# Patient Record
Sex: Female | Born: 1935 | Race: White | Hispanic: No | Smoking: Former smoker
Health system: Southern US, Community
[De-identification: ages and names within clinical notes are randomized; demographics above are authoritative.]

## PROBLEM LIST (undated history)

## (undated) DIAGNOSIS — E785 Hyperlipidemia, unspecified: Secondary | ICD-10-CM

## (undated) DIAGNOSIS — K219 Gastro-esophageal reflux disease without esophagitis: Secondary | ICD-10-CM

## (undated) DIAGNOSIS — G709 Myoneural disorder, unspecified: Secondary | ICD-10-CM

## (undated) DIAGNOSIS — I10 Essential (primary) hypertension: Secondary | ICD-10-CM

## (undated) HISTORY — PX: TOTAL HIP ARTHROPLASTY: SHX124

## (undated) HISTORY — DX: Gastro-esophageal reflux disease without esophagitis: K21.9

## (undated) HISTORY — DX: Myoneural disorder, unspecified: G70.9

## (undated) HISTORY — DX: Hyperlipidemia, unspecified: E78.5

## (undated) HISTORY — PX: BREAST BIOPSY: SHX20

## (undated) HISTORY — PX: ABDOMINAL AORTIC ANEURYSM REPAIR: SUR1152

## (undated) HISTORY — PX: CHOLECYSTECTOMY: SHX55

## (undated) HISTORY — PX: INGUINAL HERNIA REPAIR: SUR1180

---

## 2001-05-09 ENCOUNTER — Encounter (HOSPITAL_COMMUNITY): Admission: RE | Admit: 2001-05-09 | Discharge: 2001-06-08 | Payer: Self-pay | Admitting: Rheumatology

## 2001-08-26 ENCOUNTER — Encounter: Payer: Self-pay | Admitting: Family Medicine

## 2001-08-26 ENCOUNTER — Ambulatory Visit (HOSPITAL_COMMUNITY): Admission: RE | Admit: 2001-08-26 | Discharge: 2001-08-26 | Payer: Self-pay | Admitting: Family Medicine

## 2002-09-01 ENCOUNTER — Encounter: Payer: Self-pay | Admitting: Emergency Medicine

## 2002-09-02 ENCOUNTER — Inpatient Hospital Stay (HOSPITAL_COMMUNITY): Admission: EM | Admit: 2002-09-02 | Discharge: 2002-09-05 | Payer: Self-pay | Admitting: Emergency Medicine

## 2002-09-03 ENCOUNTER — Encounter: Payer: Self-pay | Admitting: Internal Medicine

## 2002-09-04 ENCOUNTER — Encounter: Payer: Self-pay | Admitting: Internal Medicine

## 2002-10-06 ENCOUNTER — Ambulatory Visit (HOSPITAL_COMMUNITY): Admission: RE | Admit: 2002-10-06 | Discharge: 2002-10-06 | Payer: Self-pay | Admitting: Urology

## 2002-10-06 ENCOUNTER — Encounter: Payer: Self-pay | Admitting: Urology

## 2003-08-18 ENCOUNTER — Ambulatory Visit (HOSPITAL_COMMUNITY): Admission: RE | Admit: 2003-08-18 | Discharge: 2003-08-18 | Payer: Self-pay | Admitting: General Surgery

## 2003-08-18 ENCOUNTER — Encounter: Payer: Self-pay | Admitting: General Surgery

## 2003-08-20 ENCOUNTER — Other Ambulatory Visit: Admission: RE | Admit: 2003-08-20 | Discharge: 2003-08-20 | Payer: Self-pay | Admitting: General Surgery

## 2004-03-11 ENCOUNTER — Emergency Department (HOSPITAL_COMMUNITY): Admission: EM | Admit: 2004-03-11 | Discharge: 2004-03-12 | Payer: Self-pay | Admitting: Emergency Medicine

## 2004-08-18 ENCOUNTER — Ambulatory Visit (HOSPITAL_COMMUNITY): Admission: RE | Admit: 2004-08-18 | Discharge: 2004-08-18 | Payer: Self-pay | Admitting: General Surgery

## 2005-05-19 ENCOUNTER — Ambulatory Visit: Payer: Self-pay | Admitting: Internal Medicine

## 2005-05-19 ENCOUNTER — Ambulatory Visit (HOSPITAL_COMMUNITY): Admission: RE | Admit: 2005-05-19 | Discharge: 2005-05-19 | Payer: Self-pay | Admitting: Internal Medicine

## 2005-08-08 ENCOUNTER — Other Ambulatory Visit: Admission: RE | Admit: 2005-08-08 | Discharge: 2005-08-08 | Payer: Self-pay | Admitting: General Surgery

## 2005-09-04 ENCOUNTER — Ambulatory Visit (HOSPITAL_COMMUNITY): Admission: RE | Admit: 2005-09-04 | Discharge: 2005-09-04 | Payer: Self-pay | Admitting: General Surgery

## 2006-02-21 ENCOUNTER — Ambulatory Visit (HOSPITAL_COMMUNITY): Admission: RE | Admit: 2006-02-21 | Discharge: 2006-02-21 | Payer: Self-pay | Admitting: Internal Medicine

## 2006-09-05 ENCOUNTER — Ambulatory Visit (HOSPITAL_COMMUNITY): Admission: RE | Admit: 2006-09-05 | Discharge: 2006-09-05 | Payer: Self-pay | Admitting: General Surgery

## 2006-09-19 ENCOUNTER — Ambulatory Visit (HOSPITAL_COMMUNITY): Admission: RE | Admit: 2006-09-19 | Discharge: 2006-09-19 | Payer: Self-pay | Admitting: Family Medicine

## 2007-02-10 ENCOUNTER — Emergency Department (HOSPITAL_COMMUNITY): Admission: EM | Admit: 2007-02-10 | Discharge: 2007-02-10 | Payer: Self-pay | Admitting: Family Medicine

## 2007-02-12 ENCOUNTER — Ambulatory Visit: Payer: Self-pay | Admitting: Internal Medicine

## 2007-02-27 ENCOUNTER — Ambulatory Visit (HOSPITAL_COMMUNITY): Admission: RE | Admit: 2007-02-27 | Discharge: 2007-02-27 | Payer: Self-pay | Admitting: Internal Medicine

## 2007-04-09 ENCOUNTER — Ambulatory Visit: Payer: Self-pay | Admitting: Internal Medicine

## 2007-09-20 ENCOUNTER — Ambulatory Visit (HOSPITAL_COMMUNITY): Admission: RE | Admit: 2007-09-20 | Discharge: 2007-09-20 | Payer: Self-pay | Admitting: Family Medicine

## 2007-11-12 ENCOUNTER — Ambulatory Visit (HOSPITAL_COMMUNITY): Admission: RE | Admit: 2007-11-12 | Discharge: 2007-11-12 | Payer: Self-pay | Admitting: Family Medicine

## 2008-09-03 ENCOUNTER — Ambulatory Visit (HOSPITAL_COMMUNITY): Admission: RE | Admit: 2008-09-03 | Discharge: 2008-09-03 | Payer: Self-pay | Admitting: Family Medicine

## 2008-09-29 ENCOUNTER — Ambulatory Visit (HOSPITAL_COMMUNITY): Admission: RE | Admit: 2008-09-29 | Discharge: 2008-09-29 | Payer: Self-pay | Admitting: Family Medicine

## 2008-10-28 ENCOUNTER — Encounter (INDEPENDENT_AMBULATORY_CARE_PROVIDER_SITE_OTHER): Payer: Self-pay | Admitting: General Surgery

## 2008-10-28 ENCOUNTER — Ambulatory Visit (HOSPITAL_COMMUNITY): Admission: RE | Admit: 2008-10-28 | Discharge: 2008-10-28 | Payer: Self-pay | Admitting: General Surgery

## 2009-05-21 ENCOUNTER — Ambulatory Visit (HOSPITAL_COMMUNITY): Admission: RE | Admit: 2009-05-21 | Discharge: 2009-05-21 | Payer: Self-pay | Admitting: Family Medicine

## 2009-11-10 ENCOUNTER — Ambulatory Visit (HOSPITAL_COMMUNITY): Admission: RE | Admit: 2009-11-10 | Discharge: 2009-11-10 | Payer: Self-pay | Admitting: Family Medicine

## 2010-04-28 ENCOUNTER — Ambulatory Visit (HOSPITAL_COMMUNITY): Admission: RE | Admit: 2010-04-28 | Discharge: 2010-04-28 | Payer: Self-pay | Admitting: Internal Medicine

## 2010-05-27 ENCOUNTER — Ambulatory Visit (HOSPITAL_COMMUNITY): Admission: RE | Admit: 2010-05-27 | Discharge: 2010-05-27 | Payer: Self-pay | Admitting: Internal Medicine

## 2010-11-11 ENCOUNTER — Ambulatory Visit (HOSPITAL_COMMUNITY)
Admission: RE | Admit: 2010-11-11 | Discharge: 2010-11-11 | Payer: Self-pay | Source: Home / Self Care | Attending: Internal Medicine | Admitting: Internal Medicine

## 2010-12-18 ENCOUNTER — Encounter: Payer: Self-pay | Admitting: Family Medicine

## 2010-12-18 ENCOUNTER — Encounter: Payer: Self-pay | Admitting: Internal Medicine

## 2011-04-11 NOTE — Assessment & Plan Note (Signed)
[name], [name]                   CHART#:  [id number]   DATE:  04/09/2007                       DOB:  02-May-1927   Followup GERD/erosive reflux esophagitis last seen 03/17/2007 at which  time I performed the EGD for reflux and found distal erosive reflux  esophagitis, noncritical and large hiatal hernia.  We switched her from  Nexium to Prevacid 30 mg daily.  She had some abdominal pain and  diarrhea which may have been related to Nexium.  That has ceased.  Reflux symptoms are now well controlled.  She is not having any  dysphagia.   She does complain of rectal bleeding, blood on her paper every time she  has bowel movement.  This nice lady had a colonoscopy for rectal  bleeding back in 05/19/2005.  She was found to have internal  hemorrhoids.  Rectum looked good.  Left-sided diverticulum.  She has one  bowel movement daily to every other day.  She has not been taking any  fiber supplement whatsoever.  She is not really having any pain, just  feels hemorrhoids and notes small volume bleeding with almost every  stool.   CURRENT MEDICATIONS:  See updated list.   ALLERGIES:  Penicillin, sulfa, Keflex.   FAMILY HISTORY:  There is no family history of colorectal neoplasia.   PHYSICAL EXAMINATION:  GENERAL:  On exam today, she looks well.  VITAL SIGNS:  Weight 183, height 5 feet 9 inches, temp 97.7, BP 140/88,  pulse 68.  SKIN:  Warm and dry.  There is no jaundice.  CHEST:  Lungs are clear to auscultation.  HEART:  Regular rate and rhythm without murmur, gallop, or rub.  ABDOMEN:  Nondistended, positive bowel sounds, soft, nontender, no  organomegaly.  EXTREMITIES:  No edema.  RECTAL EXAM:  She has a friable circumferential hemorrhoidal plexus  externally.  Digital exam elicits no tenderness.  There is good  sphincter tone.  No mass in the rectal vault.  No stone in the rectal  vault.  Mucus is hemoccult positive.   ASSESSMENT:  1. GERD symptoms, well controlled on Prevacid.   She is to continue      Prevacid 30 mg orally daily indefinitely.  Antireflux measures      emphasized.  Weight loss encouraged.  2. Low-volume hematochezia secondary to prominent external      hemorrhoids.  I have encouraged daily fiber supplement, given her      samples of Metamucil, prescription for [facility] apply to      the inner rectum t.i.d., Sitz baths encouraged.  We will do a      baseline      CBC today.  Plan to see this nice lady back in 6 weeks and see how      her rectal bleeding is doing.       Jonathon Bellows, M.D.  Electronically Signed     RMR/MEDQ  D:  04/09/2007  T:  04/09/2007  Job:  [id number]   cc:   Madelin Rear. Sherwood Gambler, MD

## 2011-04-11 NOTE — H&P (Signed)
NAME:  [name], [name]                ACCOUNT NO.:  [id number]   MEDICAL RECORD NO.:  [id number]           PATIENT TYPE:  AMB   LOCATION:  DAY                           FACILITY:  [facility]   PHYSICIAN:  Dalia Heading, M.D.  DATE OF BIRTH:  10-28-1927   DATE OF ADMISSION:  DATE OF DISCHARGE:  LH                              HISTORY & PHYSICAL   CHIEF COMPLAINT:  Chronic cholecystitis.   HISTORY OF PRESENT ILLNESS:  The patient is a 78 year old white female  who was referred for evaluation and treatment of biliary colic secondary  to chronic cholecystitis.  She has been having intermittent right upper  quadrant abdominal pain with radiation to the flank, nausea, and  indigestion for the past few months.  It is made worse with fatty foods.  No fever, chills, or jaundice have been noted.   PAST MEDICAL HISTORY:  Hypertension.   PAST SURGICAL HISTORY:  Hernia repair and breast biopsy.   CURRENT MEDICATIONS:  Diovan, alprazolam, and omeprazole.   ALLERGIES:  No known drug allergies.   REVIEW OF SYSTEMS:  Noncontributory.   PHYSICAL EXAMINATION:  GENERAL:  The patient is a well-developed, well-  nourished white female in no acute distress.  HEENT:  No scleral icterus.  LUNGS:  Clear to auscultation with equal breath sounds bilaterally.  HEART:  Regular rate and rhythm without S3, S4, or murmurs.  ABDOMEN:  Soft and nondistended.  She is tender in the right upper  quadrant to palpation.  No hepatosplenomegaly, masses, or hernias are  identified.   Ultrasound of the gallbladder reveals cholelithiasis versus a  gallbladder polyp.  She does have a stable abdominal aortic aneurysm.   IMPRESSION:  Chronic cholecystitis, cholelithiasis versus polyp.   PLAN:  The patient is scheduled for laparoscopic cholecystectomy on  October 28, 2008.  The risks and benefits of the procedure including  bleeding, infection, hepatobiliary injury, and a possibility of an open  procedure were fully explained  to the patient, who gave informed  consent.      Dalia Heading, M.D.  Electronically Signed     MAJ/MEDQ  D:  10/08/2008  T:  10/09/2008  Job:  [id number]   cc:   Patrica Duel, M.D.  Fax: [phone]   Short Stay at [facility]

## 2011-04-11 NOTE — Op Note (Signed)
[name], [name]                ACCOUNT NO.:  [id number]   MEDICAL RECORD NO.:  [id number]          PATIENT TYPE:  AMB   LOCATION:  DAY                           FACILITY:  [facility]   PHYSICIAN:  Dalia Heading, M.D.  DATE OF BIRTH:  1927/01/18   DATE OF PROCEDURE:  10/28/2008  DATE OF DISCHARGE:                               OPERATIVE REPORT   PREOPERATIVE DIAGNOSIS:  Chronic cholecystitis, question gallbladder  polyp.   POSTOPERATIVE DIAGNOSIS:  Chronic cholecystitis, question gallbladder  polyp.   PROCEDURE:  Laparoscopic cholecystectomy.   SURGEON:  Dalia Heading, MD   ANESTHESIA:  General endotracheal.   INDICATIONS:  The patient is a 77 year old white female who presents  with biliary colic secondary to chronic cholecystitis.  The risks and  benefits of the procedure including bleeding, infection, hepatobiliary  injury, and the possibly an open procedure were fully explained to the  patient, gave informed consent.   PROCEDURE NOTE:  The patient was placed in the supine position.  After  induction of general endotracheal anesthesia, the abdomen was prepped  and draped using the usual sterile technique with Betadine.  Surgical  site confirmation was performed.   A supraumbilical incision was made down to the fascia.  A Veress needle  was introduced into the abdominal cavity and confirmation of placement  was done using the saline drop test.  The abdomen was then insufflated  to 16 mmHg pressure.  An 11-mm trocar was introduced into the abdominal  cavity under direct visualization without difficulty.  The patient was  placed in reverse Trendelenburg position.  Additional 11-mm trocar was  placed in the epigastric region and 5-mm trocar was placed in the right  upper quadrant, right flank regions.  Liver was inspected and noted to  have mild early cirrhotic changes, which also appeared to be age-  appropriate.  The gallbladder was retracted superior and laterally.  The  dissection was begun around the infundibulum of the gallbladder.  The  cystic duct was first identified.  Its juncture to the infundibulum  fully identified.  EndoClip was placed proximally and distally on the  cystic duct and the cystic duct was divided.  This was likewise done in  cystic artery.  The gallbladder was then freed away from the gallbladder  fossa using Bovie electrocautery.  The gallbladder was delivered through  the trocar site using EndoCatch bag.  The gallbladder fossa was  inspected and no abnormal bleeding or bile leakage was noted.  Surgicel  was placed in the gallbladder fossa.  All fluid and air were then  evacuated from the abdominal cavity prior to removal of the trocars.   All wounds were irrigated with normal saline.  All wounds were injected  with 0.5% Sensorcaine.  The supraumbilical fascia was reapproximated  using an 0-Vicryl interrupted suture.  All skin incisions were closed  using staples.  Betadine ointment, dry sterile dressings were applied.   All tape and needle counts were correct at the end of the procedure.  The patient was extubated in the operating room, went back to recovery  room  awake in stable condition.   COMPLICATIONS:  None.   SPECIMEN:  Gallbladder.   ESTIMATED BLOOD LOSS:  Minimal.      Dalia Heading, M.D.  Electronically Signed     MAJ/MEDQ  D:  10/28/2008  T:  10/28/2008  Job:  [id number]   cc:   Patrica Duel, M.D.  Fax: [phone]

## 2011-04-14 NOTE — Op Note (Signed)
NAME:  [name], [name]                ACCOUNT NO.:  [id number]   MEDICAL RECORD NO.:  [id number]          PATIENT TYPE:  AMB   LOCATION:  DAY                           FACILITY:  [facility]   PHYSICIAN:  R. Roetta Sessions, M.D. DATE OF BIRTH:  07-Jan-1927   DATE OF PROCEDURE:  05/19/2005  DATE OF DISCHARGE:                                 OPERATIVE REPORT   PROCEDURE:  Diagnostic colonoscopy.   INDICATIONS FOR PROCEDURE:  The patient is a 77 year old lady with  intermittent low volume hematochezia referred for colonoscopy. She had never  had a colonoscopy. There is no family history of colorectal neoplasia.  Colonoscopy is now being done. This approach has been discussed with the  patient at length. Potential risks, benefits, and alternatives have been  reviewed and questions answered. She is agreeable. Please see documentation  in the medical record.   PROCEDURE NOTE:  O2 saturation, blood pressure, pulse, and respirations were  monitored throughout the entire procedure.  Conscious sedation with IV  Versed and Demerol in incremental doses.   INSTRUMENT:  Olympus video chip system.   FINDINGS:  Digital rectal exam revealed no abnormalities.   ENDOSCOPIC FINDINGS:  Prep was good.   Rectum:  Examination of the rectal mucosa including retroflexed view of the  anal verge revealed only internal hemorrhoids.   Colon:  Colonic mucosa was surveyed from the rectosigmoid junction through  the left, transverse, and right colon to the area of the appendiceal  orifice, ileocecal valve, and cecum. These structures were well seen and  photographed for the record. From this level, the scope was slowly  withdrawn, and all previously mentioned mucosal surfaces were again seen.  The patient had left sided diverticula. The remainder of the colonic mucosa  appeared normal. The colon was somewhat elongated tortuous requiring  external abdominal pressure and changing of the patient's position to reach  the  cecum. The patient tolerated the procedure well and was reactive to  endoscopy.   IMPRESSION:  Internal hemorrhoids. Otherwise normal rectum. Left sided  diverticula. Remainder of colonic mucosa appeared normal.   RECOMMENDATIONS:  1.  Diverticulosis and hemorrhoid literature provided to Ms. Snarski.  2.  Daily Metamucil or Citrucel or fiber supplement.  3.  Anusol HC suppositories 1 per rectum at bedtime for two weeks.  4.  The patient is to let me know if she has any further rectal bleeding.       RMR/MEDQ  D:  05/19/2005  T:  05/19/2005  Job:  [id number]

## 2011-04-14 NOTE — Op Note (Signed)
NAME:  [name], [name]                ACCOUNT NO.:  [id number]   MEDICAL RECORD NO.:  [id number]          PATIENT TYPE:  AMB   LOCATION:  DAY                           FACILITY:  [facility]   PHYSICIAN:  R. Roetta Sessions, M.D. DATE OF BIRTH:  1927/01/28   DATE OF PROCEDURE:  DATE OF DISCHARGE:                               OPERATIVE REPORT   PROCEDURE:  Diagnostic esophagogastroduodenoscopy.   INDICATIONS FOR PROCEDURE:  The patient is a 77 year old Caucasian  female who came to see me recently for exacerbation of chronic diarrhea  which has resolved. She has also had significant long, long history of  gastroesophageal reflux disease and never had her upper GI tract  evaluated.  We stopped Nexium because of some abdominal pain and the  concern that diarrhea could have been exacerbated by Nexium.  She was  started on Prevacid 30 mg orally daily through my office on 02/12/2007.  This has been associated with marked improvement in her abdominal pain  and diarrhea as well as her reflux symptoms.  EGD is now being done.  This approach has been discussed with the patient at length. Potential  risks, benefits and alternatives have been reviewed, questions answered.  She is agreeable. Please see documentation in medical records procedure  note, O2 Saturation, blood pressure and pulses.  The patient was  monitored throughout the entire procedure.   SEDATION:  Conscious sedation with Versed 3 mg IV, Demerol 75 mg IV,  divided plus Cetacaine spray for topical pharyngeal anesthesia.   INSTRUMENT:  Pentax video chip system.   FINDINGS:  Examination achieved via esophagus revealed some erosion of  the EG junction that was noncritical ring without Barrett's esophagus or  neoplasia.  EG junction was easily traversed into the stomach. Gastric  cavity was empty, insufflated well with air throughout the examination.  Gastric mucosa retroflex view revealed proximal stomach, esophagogastric  junction  demonstrated a moderately large hiatal hernia.  Otherwise, the  gastric mucosa appeared normal.  The pylorus was patent was patent and  easily traversed.  Examination of the bulb to the 2nd portion revealed  no abnormalities.   Therapeutic/diagnostic maneuvers performed:  None.   The patient tolerated the procedure as well as direct endoscopy.   IMPRESSION:  1. Distal esophageal erosions consistent with mild erosive reflux      esophagitis and noncritical Schatzki's ring not manipulated,      otherwise normal esophagus.  2. Moderately large hiatal hernia.  Otherwise normal stomach, D1, D2.   RECOMMENDATION:  Anti-reflux list was provided to Ms. Bruntz.  Continue  Prevacid 30 mg orally daily.  Followup appointment with [place] in 6 weeks.      Jonathon Bellows, M.D.  Electronically Signed     RMR/MEDQ  D:  02/27/2007  T:  02/27/2007  Job:  [id number]

## 2011-04-14 NOTE — H&P (Signed)
[name], [name]                  ACCOUNT NO.:  [id number]   MEDICAL RECORD NO.:  [id number]          PATIENT TYPE:  OUT   LOCATION:  RAD                           FACILITY:  [facility]   PHYSICIAN:  R. Roetta Sessions, M.D. DATE OF BIRTH:  08-Jun-1927   DATE OF ADMISSION:  09/19/2006  DATE OF DISCHARGE:  10/24/2007LH                              HISTORY & PHYSICAL   Audio too short to transcribe (less than 5 seconds)      R. Roetta Sessions, M.D.     RMR/MEDQ  D:  02/12/2007  T:  02/12/2007  Job:  [id number]
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
## 2011-04-14 NOTE — Consult Note (Signed)
[name], [name]                  ACCOUNT NO.:  [id number]   MEDICAL RECORD NO.:  [id number]          PATIENT TYPE:  AMB   LOCATION:                                FACILITY:  [facility]   PHYSICIAN:  R. Roetta Sessions, M.D. DATE OF BIRTH:  [birth year]   DATE OF CONSULTATION:  02/12/2007  DATE OF DISCHARGE:                                 CONSULTATION   CHIEF COMPLAINT:  Diarrhea, history of reflux.   [name] is a pleasant 77 year old Caucasian female seen through  the courtesy of Dr. Patrica Duel to further evaluate a couple month  history of diarrhea.  She tells me she has had diarrhea off and on for a  few years.  She states diarrhea subsided two weeks ago and was wondering  why she needed to keep her appointment, she is having one formed bowel  movement daily currently.  She also has headache and vague epigastric  pain from time to time.  She was started on hyoscyamine a couple of  weeks ago with excellent control in her diarrhea symptoms.  She has been  on Nexium 40 mg orally daily for a few years.  She continues to have  significant regurgitation and reflux symptoms.  She has never had her  upper GI tract evaluated.  She tells me she had a CT scan at [facility] for the epigastric pain and nothing was found previously.  Her  gallbladder remains in situ.  Abdominal pain does not have a post  prandial component.  She is moving her bowels once daily.  She had a  history of rectal bleeding for which she had a colonoscopy in June 2006,  and found hemorrhoids and some left-sided diverticula.  She has not lost  any weight.  Her appetite is well maintained.  She denies odynophagia or  dysphagia.  She was originally started on doxycycline for left facial  parotitis.  She does not use alcohol.  She stopped smoking 15 years ago.   PAST MEDICAL HISTORY:  1. Hypertension.  2. Gastroesophageal reflux disease.   PAST SURGICAL HISTORY:  1. Inguinal hernia repair.  2. Breast  biopsy for benign cysts.   CURRENT MEDICATIONS:  1. Hyoscyamine b.i.d.  2. Nexium 40 mg orally daily.  3. Benicar/hydrochlorothiazide 20/12.5 daily.  4. Doxycycline b.i.d.   ALLERGIES:  1. PENICILLIN.  2. SULFA.  3. KEFLEX.   FAMILY HISTORY:  Mother died with a broken neck at age 11.  Father died  with MI in his 4s.  No history of chronic GI or liver illness.   SOCIAL HISTORY:  Patient is widowed.  She has four children.  Retired  from [employer].  Stopped smoking 15 years ago.  No alcohol.   REVIEW OF SYSTEMS:  No recent chest pain or dyspnea on exertion.  No  fever or chills.  No change in weight.  Otherwise as in history of  present illness.   PHYSICAL EXAMINATION:  GENERAL APPEARANCE:  A pleasant 77 year old lady  resting comfortably accompanied by her daughter.  VITAL SIGNS:  Weight 188, height  5 feet 9, temperature 98, blood  pressure 122/80, pulse 80.  SKIN:  Warm and dry, no jaundice.  HEENT:  No scleral icterus.  Conjunctivae are pink.  CHEST:  Lungs are clear to auscultation.  CARDIOVASCULAR:  Regular rate and rhythm without murmurs, rubs, or  gallops.  ABDOMEN:  Nondistended with positive bowel sounds.  She does have, just  to the left of the xiphoid process, localized tenderness just below the  costal margin.  No appreciable mass or organomegaly.  EXTREMITIES:  No edema.  RECTAL:  External hemorrhoids.  Digital mass revealed no mass, brown  stool, Hemoccult negative.   IMPRESSION:  [name] is a pleasant 77 year old lady with recent  exacerbation of chronic diarrhea.  Symptoms are better on hyoscyamine.  I note she does take Nexium and has headache and some vague epigastric  pain along the way.  These are all potential side effects of Nexium.  No  doubt she has a long, long history of significant gastroesophageal  reflux disease symptoms and has not had her upper gastrointestinal tract  evaluated by her history.  She has had an unremarkable CT scan for  her  epigastric pain previously.  As far as diarrhea is concerned, she could  have underlying irritable bowel syndrome. I think we need to go ahead  and change her out from Nexium to another agent.  She tells me she has  tried AcipHex before but it did not work.   RECOMMENDATIONS:  1. Stop Nexium, begin Prevacid 30 mg capsules one daily and samples      provided for three weeks.  2. Proceed with an EGD.  Potential risks, benefits, and alternatives      have been reviewed.  Questions answered.  She is agreeable.  Make      further recommendations in the very near future.  She was      admonished to take a full glass of water with all of her      medications including doxycycline and stay upright for 30 minutes      after ingesting her medications to decrease the chances of pill-      induced esophagitis.   I would like to thank Dr. Patrica Duel for allowing me to see this nice  lady once again.      Jonathon Bellows, M.D.  Electronically Signed     RMR/MEDQ  D:  02/12/2007  T:  02/12/2007  Job:  [id number]   cc:   Patrica Duel, M.D.  Fax: [phone]
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
## 2011-04-21 ENCOUNTER — Other Ambulatory Visit (HOSPITAL_COMMUNITY): Payer: Self-pay | Admitting: Internal Medicine

## 2011-04-21 DIAGNOSIS — I10 Essential (primary) hypertension: Secondary | ICD-10-CM

## 2011-04-21 DIAGNOSIS — Z139 Encounter for screening, unspecified: Secondary | ICD-10-CM

## 2011-04-27 ENCOUNTER — Encounter (HOSPITAL_COMMUNITY): Payer: Self-pay

## 2011-04-27 ENCOUNTER — Ambulatory Visit (HOSPITAL_COMMUNITY)
Admission: RE | Admit: 2011-04-27 | Discharge: 2011-04-27 | Disposition: A | Discharge status: Home / Self Care | Payer: Medicare Other | Source: Ambulatory Visit | Attending: Internal Medicine | Admitting: Internal Medicine

## 2011-04-27 DIAGNOSIS — Z139 Encounter for screening, unspecified: Secondary | ICD-10-CM

## 2011-04-27 DIAGNOSIS — I714 Abdominal aortic aneurysm, without rupture, unspecified: Secondary | ICD-10-CM | POA: Insufficient documentation

## 2011-04-27 DIAGNOSIS — I10 Essential (primary) hypertension: Secondary | ICD-10-CM | POA: Insufficient documentation

## 2011-04-27 HISTORY — DX: Essential (primary) hypertension: I10

## 2011-05-29 ENCOUNTER — Encounter: Payer: Medicare Other | Admitting: Surgery

## 2011-06-12 ENCOUNTER — Encounter (INDEPENDENT_AMBULATORY_CARE_PROVIDER_SITE_OTHER): Payer: Medicare Other | Admitting: Surgery

## 2011-06-12 DIAGNOSIS — I714 Abdominal aortic aneurysm, without rupture, unspecified: Secondary | ICD-10-CM
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
## 2011-07-27 ENCOUNTER — Encounter: Payer: Self-pay | Admitting: Vascular Surgery

## 2011-08-15 ENCOUNTER — Encounter: Payer: Medicare Other | Admitting: Vascular Surgery

## 2011-08-29 LAB — BASIC METABOLIC PANEL
CO2: 26
Calcium: 9.4
Chloride: 107
Glucose, Bld: 102 — ABNORMAL HIGH
Potassium: 3.8
Sodium: 139

## 2011-08-29 LAB — CBC
HCT: 41.5
Hemoglobin: 14.3
MCHC: 34.4
MCV: 91.3
RBC: 4.55
RDW: 13.1

## 2011-08-29 LAB — HEPATIC FUNCTION PANEL
Alkaline Phosphatase: 75
Bilirubin, Direct: 0.2
Total Bilirubin: 1

## 2011-09-19 ENCOUNTER — Encounter: Payer: Medicare Other | Admitting: Vascular Surgery

## 2011-10-20 ENCOUNTER — Other Ambulatory Visit (HOSPITAL_COMMUNITY): Payer: Self-pay | Admitting: General Surgery

## 2011-10-20 DIAGNOSIS — Z139 Encounter for screening, unspecified: Secondary | ICD-10-CM

## 2011-11-13 ENCOUNTER — Ambulatory Visit (HOSPITAL_COMMUNITY)
Admission: RE | Admit: 2011-11-13 | Discharge: 2011-11-13 | Disposition: A | Discharge status: Home / Self Care | Payer: Medicare Other | Source: Ambulatory Visit | Attending: General Surgery | Admitting: General Surgery

## 2011-11-13 DIAGNOSIS — Z1231 Encounter for screening mammogram for malignant neoplasm of breast: Secondary | ICD-10-CM | POA: Insufficient documentation

## 2011-11-13 DIAGNOSIS — Z139 Encounter for screening, unspecified: Secondary | ICD-10-CM

## 2012-02-07 ENCOUNTER — Other Ambulatory Visit: Payer: Self-pay | Admitting: Dermatology

## 2012-06-10 ENCOUNTER — Ambulatory Visit: Payer: Medicare Other | Admitting: Surgery

## 2012-06-14 ENCOUNTER — Encounter: Payer: Self-pay | Admitting: Neurosurgery

## 2012-06-17 ENCOUNTER — Encounter: Payer: Self-pay | Admitting: Neurosurgery

## 2012-06-17 ENCOUNTER — Ambulatory Visit (INDEPENDENT_AMBULATORY_CARE_PROVIDER_SITE_OTHER): Payer: Medicare Other | Admitting: *Deleted

## 2012-06-17 ENCOUNTER — Ambulatory Visit (INDEPENDENT_AMBULATORY_CARE_PROVIDER_SITE_OTHER): Payer: Medicare Other | Admitting: Neurosurgery

## 2012-06-17 VITALS — BP 162/88 | HR 65 | Resp 16 | Ht 69.0 in | Wt 180.5 lb

## 2012-06-17 DIAGNOSIS — I714 Abdominal aortic aneurysm, without rupture: Secondary | ICD-10-CM

## 2012-06-17 DIAGNOSIS — Z48812 Encounter for surgical aftercare following surgery on the circulatory system: Secondary | ICD-10-CM | POA: Insufficient documentation

## 2012-06-17 NOTE — Progress Notes (Signed)
[facility] Office Note  CC: Annual AAA duplex for surveillance Referring Physician: Hart Rochester  History of Present Illness: 78 year old female patient of Dr. Hart Rochester who is followed for known AAA. The patient denies any abdominal or back pain. Patient also denies any new medical diagnoses or recent surgeries.  Past Medical History  Diagnosis Date  . Hypertension   . GERD (gastroesophageal reflux disease)   . Neuromuscular disorder   . Hyperlipidemia     ROS: [x]  Positive   [ ]  Denies    General: [ ]  Weight loss, [ ]  Fever, [ ]  chills Neurologic: [ ]  Dizziness, [ ]  Blackouts, [ ]  Seizure [ ]  Stroke, [ ]  "Mini stroke", [ ]  Slurred speech, [ ]  Temporary blindness; [ ]  weakness in arms or legs, [ ]  Hoarseness Cardiac: [ ]  Chest pain/pressure, [ ]  Shortness of breath at rest [ ]  Shortness of breath with exertion, [ ]  Atrial fibrillation or irregular heartbeat Vascular: [ ]  Pain in legs with walking, [ ]  Pain in legs at rest, [ ]  Pain in legs at night,  [ ]  Non-healing ulcer, [ ]  Blood clot in vein/DVT,   Pulmonary: [ ]  Home oxygen, [ ]  Productive cough, [ ]  Coughing up blood, [ ]  Asthma,  [ ]  Wheezing Musculoskeletal:  [ ]  Arthritis, [ ]  Low back pain, [ ]  Joint pain Hematologic: [ ]  Easy Bruising, [ ]  Anemia; [ ]  Hepatitis Gastrointestinal: [ ]  Blood in stool, [ ]  Gastroesophageal Reflux/heartburn, [ ]  Trouble swallowing Urinary: [ ]  chronic Kidney disease, [ ]  on HD - [ ]  MWF or [ ]  TTHS, [ ]  Burning with urination, [ ]  Difficulty urinating Skin: [ ]  Rashes, [ ]  Wounds Psychological: [ ]  Anxiety, [ ]  Depression   Social History History  Substance Use Topics  . Smoking status: Former Smoker -- 0.5 packs/day    Types: Cigarettes    Quit date: 11/28/1991  . Smokeless tobacco: Not on file  . Alcohol Use: No    Family History Family History  Problem Relation Age of Onset  . Heart disease Father     Allergies  Allergen Reactions  .  Cephalexin   . Sulfa Antibiotics     Current Outpatient Prescriptions  Medication Sig Dispense Refill  . ALPRAZolam (XANAX) 0.5 MG tablet Take 0.25 mg by mouth at bedtime as needed. 0.5 mg take1/2 tab at hs       . gabapentin (NEURONTIN) 100 MG capsule Take 100 mg by mouth 3 (three) times daily.        Marland Kitchen omeprazole (PRILOSEC) 20 MG capsule Take 20 mg by mouth daily.        . valsartan-hydrochlorothiazide (DIOVAN-HCT) 160-12.5 MG per tablet Take 1 tablet by mouth daily.        . Vitamin D, Ergocalciferol, (DRISDOL) 50000 UNITS CAPS Take 50,000 Units by mouth.        . calcium carbonate (TUMS - DOSED IN MG ELEMENTAL CALCIUM) 500 MG chewable tablet Chew 1 tablet by mouth daily.        . fish oil-omega-3 fatty acids 1000 MG capsule Take 2 g by mouth daily.          Physical Examination  Filed Vitals:   06/17/12 1001  BP: 162/88  Pulse: 65  Resp: 16    Body mass index is 26.66 kg/(m^2).  General:  WDWN in NAD Gait: Normal HEENT: WNL Eyes: Pupils equal Pulmonary: normal non-labored breathing , without Rales, rhonchi,  wheezing  Cardiac: RRR, without  Murmurs, rubs or gallops; No carotid bruits Abdomen: soft, NT, no masses Skin: no rashes, ulcers noted Vascular Exam/Pulses: 2+ radial pulses bilaterally, palpable femoral pulses bilaterally, no abdominal mass is palpated  Extremities without ischemic changes, no Gangrene , no cellulitis; no open wounds;  Musculoskeletal: no muscle wasting or atrophy  Neurologic: A&O X 3; Appropriate Affect ; SENSATION: normal; MOTOR FUNCTION:  moving all extremities equally. Speech is fluent/normal  Non-Invasive Vascular Imaging: Maximum AAA sac size today is 3.09 distally  ASSESSMENT/PLAN: Asymptomatic patient with a small known AAA. This has not increased from previous exam which was 3.2. The patient will followup in one year with repeat AAA duplex. Her questions were encouraged and answered, she is in agreement with this plan. The patient knows  the signs and symptoms of rupture and knows to report to the nearest emergency department should this occur.  Lauree Chandler ANP  Clinic M.D.: Hart Rochester

## 2012-06-24 NOTE — Procedures (Unsigned)
DUPLEX ULTRASOUND OF ABDOMINAL AORTA  INDICATION:  AAA follow-up.  HISTORY: Diabetes:  No. Cardiac:  No. Hypertension:  Yes. Smoking:  Yes. Connective Tissue Disorder: Family History:  No. Previous Surgery:  No.  DUPLEX EXAM:         AP (cm)                   TRANSVERSE (cm) Proximal             2.97 cm Mid                  2.82 cm Distal               3.09 cm Right Iliac          Not visualized Left Iliac           Not visualized  PREVIOUS:  Date:  Unknown date and location  AP:  3.2 cm  TRANSVERSE:  IMPRESSION: 1. Abdominal aortic aneurysm measuring 3.09 cm anterior posterior at     its largest diameter. 2. Limited visualization due to overlying bowel gas.  ___________________________________________ Quita Skye. Hart Rochester, M.D.  EM/MEDQ  D:  06/17/2012  T:  06/17/2012  Job:  [id number]

## 2012-10-15 ENCOUNTER — Other Ambulatory Visit (HOSPITAL_COMMUNITY): Payer: Self-pay | Admitting: General Surgery

## 2012-10-15 DIAGNOSIS — Z139 Encounter for screening, unspecified: Secondary | ICD-10-CM

## 2012-11-14 ENCOUNTER — Ambulatory Visit (HOSPITAL_COMMUNITY): Payer: Medicare Other

## 2012-11-26 ENCOUNTER — Ambulatory Visit (HOSPITAL_COMMUNITY)
Admission: RE | Admit: 2012-11-26 | Discharge: 2012-11-26 | Disposition: A | Discharge status: Home / Self Care | Payer: Medicare Other | Source: Ambulatory Visit | Attending: General Surgery | Admitting: General Surgery

## 2012-11-26 DIAGNOSIS — Z1231 Encounter for screening mammogram for malignant neoplasm of breast: Secondary | ICD-10-CM | POA: Insufficient documentation

## 2012-11-26 DIAGNOSIS — Z139 Encounter for screening, unspecified: Secondary | ICD-10-CM

## 2013-06-17 ENCOUNTER — Ambulatory Visit: Payer: Medicare Other | Admitting: Neurosurgery

## 2013-06-17 ENCOUNTER — Encounter (INDEPENDENT_AMBULATORY_CARE_PROVIDER_SITE_OTHER): Payer: Medicare Other | Admitting: *Deleted

## 2013-06-17 DIAGNOSIS — I714 Abdominal aortic aneurysm, without rupture: Secondary | ICD-10-CM

## 2013-06-18 ENCOUNTER — Other Ambulatory Visit: Payer: Self-pay | Admitting: *Deleted

## 2013-06-18 DIAGNOSIS — I714 Abdominal aortic aneurysm, without rupture: Secondary | ICD-10-CM

## 2013-06-26 ENCOUNTER — Encounter: Payer: Self-pay | Admitting: Vascular Surgery

## 2014-05-06 ENCOUNTER — Other Ambulatory Visit: Payer: Self-pay | Admitting: Dermatology

## 2014-10-21 ENCOUNTER — Other Ambulatory Visit: Payer: Self-pay | Admitting: Dermatology

## 2015-04-02 ENCOUNTER — Emergency Department (HOSPITAL_COMMUNITY): Payer: Medicare Other

## 2015-04-02 ENCOUNTER — Encounter (HOSPITAL_COMMUNITY): Payer: Self-pay

## 2015-04-02 ENCOUNTER — Emergency Department (HOSPITAL_COMMUNITY)
Admission: EM | Admit: 2015-04-02 | Discharge: 2015-04-02 | Disposition: A | Discharge status: Home / Self Care | Payer: Medicare Other | Attending: Emergency Medicine | Admitting: Emergency Medicine

## 2015-04-02 DIAGNOSIS — Y9389 Activity, other specified: Secondary | ICD-10-CM | POA: Insufficient documentation

## 2015-04-02 DIAGNOSIS — Y92007 Garden or yard of unspecified non-institutional (private) residence as the place of occurrence of the external cause: Secondary | ICD-10-CM | POA: Diagnosis not present

## 2015-04-02 DIAGNOSIS — Z8719 Personal history of other diseases of the digestive system: Secondary | ICD-10-CM | POA: Diagnosis not present

## 2015-04-02 DIAGNOSIS — Z87891 Personal history of nicotine dependence: Secondary | ICD-10-CM | POA: Diagnosis not present

## 2015-04-02 DIAGNOSIS — E785 Hyperlipidemia, unspecified: Secondary | ICD-10-CM | POA: Insufficient documentation

## 2015-04-02 DIAGNOSIS — R0789 Other chest pain: Secondary | ICD-10-CM

## 2015-04-02 DIAGNOSIS — S7001XA Contusion of right hip, initial encounter: Secondary | ICD-10-CM | POA: Insufficient documentation

## 2015-04-02 DIAGNOSIS — Y998 Other external cause status: Secondary | ICD-10-CM | POA: Diagnosis not present

## 2015-04-02 DIAGNOSIS — W010XXA Fall on same level from slipping, tripping and stumbling without subsequent striking against object, initial encounter: Secondary | ICD-10-CM | POA: Insufficient documentation

## 2015-04-02 DIAGNOSIS — W19XXXA Unspecified fall, initial encounter: Secondary | ICD-10-CM

## 2015-04-02 DIAGNOSIS — Z79899 Other long term (current) drug therapy: Secondary | ICD-10-CM | POA: Diagnosis not present

## 2015-04-02 DIAGNOSIS — I10 Essential (primary) hypertension: Secondary | ICD-10-CM | POA: Diagnosis not present

## 2015-04-02 DIAGNOSIS — S299XXA Unspecified injury of thorax, initial encounter: Secondary | ICD-10-CM | POA: Diagnosis not present

## 2015-04-02 DIAGNOSIS — S79911A Unspecified injury of right hip, initial encounter: Secondary | ICD-10-CM | POA: Diagnosis present

## 2015-04-02 MED ORDER — TRAMADOL HCL 50 MG PO TABS: 50.0000 mg | ORAL_TABLET | Freq: Four times a day (QID) | ORAL | Status: DC | PRN

## 2015-04-02 NOTE — ED Provider Notes (Signed)
CSN: [id number]     Arrival date & time 04/02/15  1622 History   First MD Initiated Contact with Patient 04/02/15 1634     Chief Complaint  Patient presents with  . Fall     (Consider location/radiation/quality/duration/timing/severity/associated sxs/prior Treatment) Patient is a 81 y.o. female presenting with fall. The history is provided by the patient and a relative.  Fall Associated symptoms include chest pain. Pertinent negatives include no abdominal pain, no headaches and no shortness of breath.   patient with a fall while outside and slipped and fell on a rock that occurred on Wednesday. 2 days ago. Patient with complaint of pain to the right hip and the right lower lateral chest area. No loss of consciousness no headache no neck pain no back pain no other extremity pain. No abdominal pain. No nausea vomiting. Patient is not on any blood thinners.  Past Medical History  Diagnosis Date  . Hypertension   . GERD (gastroesophageal reflux disease)   . Neuromuscular disorder   . Hyperlipidemia    Past Surgical History  Procedure Laterality Date  . Abdominal aortic aneurysm repair    . Breast biopsy    . Inguinal hernia repair    . Cholecystectomy     Family History  Problem Relation Age of Onset  . Heart disease Father    History  Substance Use Topics  . Smoking status: Former Smoker -- 0.50 packs/day    Types: Cigarettes    Quit date: 11/28/1991  . Smokeless tobacco: Not on file  . Alcohol Use: No   OB History    No data available     Review of Systems  Constitutional: Negative for fever.  HENT: Negative for congestion.   Eyes: Negative for visual disturbance.  Respiratory: Negative for shortness of breath.   Cardiovascular: Positive for chest pain.  Gastrointestinal: Negative for nausea, vomiting and abdominal pain.  Genitourinary: Negative for dysuria.  Musculoskeletal: Negative for back pain and neck pain.  Skin: Negative for rash.  Neurological: Negative  for syncope and headaches.  Hematological: Bruises/bleeds easily.  Psychiatric/Behavioral: Negative for confusion.      Allergies  Cephalexin and Sulfa antibiotics  Home Medications   Prior to Admission medications   Medication Sig Start Date End Date Taking? Authorizing Provider  ALPRAZolam Prudy Feeler) 0.5 MG tablet Take 0.25 mg by mouth at bedtime as needed.    Yes Historical Provider, MD  gabapentin (NEURONTIN) 100 MG capsule Take 100 mg by mouth 3 (three) times daily.     Yes Historical Provider, MD  pravastatin (PRAVACHOL) 40 MG tablet Take 40 mg by mouth at bedtime.   Yes Historical Provider, MD  valsartan-hydrochlorothiazide (DIOVAN-HCT) 160-12.5 MG per tablet Take 1 tablet by mouth daily.     Yes Historical Provider, MD   BP 125/96 mmHg  Pulse 80  Temp(Src) 97.8 F (36.6 C) (Oral)  Resp 20  Ht 5\' 9"  (1.753 m)  Wt 165 lb (74.844 kg)  BMI 24.36 kg/m2  SpO2 100% Physical Exam  Constitutional: She is oriented to person, place, and time. She appears well-developed and well-nourished. No distress.  HENT:  Head: Normocephalic and atraumatic.  Mouth/Throat: Oropharynx is clear and moist.  Eyes: Conjunctivae and EOM are normal. Pupils are equal, round, and reactive to light.  Neck: Normal range of motion.  Cardiovascular: Normal rate, regular rhythm and normal heart sounds.   Pulmonary/Chest: Effort normal and breath sounds normal. No respiratory distress. She exhibits tenderness.  Patient with mild tenderness to palpation  to the right lateral lower rib area. No bruising to that area no crepitance. No deformity.  Abdominal: Soft. Bowel sounds are normal. There is no tenderness.  Musculoskeletal: Normal range of motion. She exhibits tenderness.  Large area of dark bruise to the right hip measuring probably about 15 cm in size. No obvious deformity. No increased pain with range of motion of the right thigh.  Neurological: She is alert and oriented to person, place, and time. No  cranial nerve deficit. She exhibits normal muscle tone.  Skin: Skin is warm. No rash noted.  Nursing note and vitals reviewed.   ED Course  Procedures (including critical care time) Labs Review Labs Reviewed - No data to display  Imaging Review Dg Ribs Unilateral W/chest Right  04/02/2015   CLINICAL DATA:  Right-sided axillary rib pain. Fell yesterday. Initial encounter.  EXAM: RIGHT RIBS AND CHEST - 3+ VIEW  COMPARISON:  None.  FINDINGS: The cardiac silhouette is upper limits of normal in size. The lungs are hypoinflated with coarsening of the interstitial markings bilaterally and mildly increased opacity in both lung bases. No pleural effusion or pneumothorax is identified. No rib fracture is identified. Right upper quadrant abdominal surgical clips are noted. S shaped thoracolumbar scoliosis is present.  IMPRESSION: 1. No rib fracture identified. 2. Hypoinflation with mild bibasilar opacities, likely atelectasis. Coarsening of the interstitial markings may reflect chronic interstitial lung disease, however an acute inflammatory or infectious process is not excluded given the lack of prior chest imaging for comparison.   Electronically Signed   By: Sebastian Ache   On: 04/02/2015 18:05   Dg Hips Bilat With Pelvis 2v  04/02/2015   CLINICAL DATA:  Bilateral hip pain following fall, initial encounter  EXAM: BILATERAL HIP (WITH PELVIS) 2 VIEWS  COMPARISON:  None.  FINDINGS: There is no evidence of hip fracture or dislocation. There is no evidence of arthropathy or other focal bone abnormality.  IMPRESSION: No acute abnormality noted.   Electronically Signed   By: Alcide Clever M.D.   On: 04/02/2015 17:59     EKG Interpretation None      MDM   Final diagnoses:  Fall  Contusion, hip, right, initial encounter  Right-sided chest wall pain    Patient status post fall 2 days ago. Slipped and yard fell on a rock. Big bruise to the right area of her hip and thigh but no evidence of any bony injury to  the pelvis or hip. Also with some mild discomfort to the right lateral low rib area no abdominal tenderness. X-rays of the ribs and chest without any significant abnormalities. Suspect at least a chest wall contusion then in that area. Patient hemodynamically stable patient in no acute distress. Will treat symptomatically.   Vanetta Mulders, MD 04/02/15 1827

## 2015-04-02 NOTE — ED Notes (Signed)
Pt ambulatory to the room 

## 2015-04-02 NOTE — ED Notes (Signed)
Patient given discharge instruction, verbalized understand. Patient ambulatory out of the department.  

## 2015-04-02 NOTE — Discharge Instructions (Signed)
X-rays of the hip and pelvis without any bony injuries. X-rays of the chest and right ribs without evidence of any Route rib fractures or lung problem. Pain is most likely coming from a chest wall contusion and a right hip contusion. Take the tramadol as directed. Follow-up with your doctor as needed.

## 2015-04-02 NOTE — ED Notes (Signed)
Pt fell 2 days ago and c/o pain in r hip and r lower back.  Reports was out in her yard and slipped on a rock in her yard.

## 2015-06-22 ENCOUNTER — Other Ambulatory Visit (HOSPITAL_COMMUNITY): Payer: Medicare Other

## 2015-06-22 ENCOUNTER — Ambulatory Visit: Payer: Medicare Other | Admitting: Family

## 2015-07-15 ENCOUNTER — Inpatient Hospital Stay (HOSPITAL_COMMUNITY)
Admission: EM | Admit: 2015-07-15 | Discharge: 2015-07-20 | DRG: 469 | Disposition: A | Discharge status: Skilled Nursing Facility | Payer: Medicare Other | Attending: Internal Medicine | Admitting: Internal Medicine

## 2015-07-15 ENCOUNTER — Emergency Department (HOSPITAL_COMMUNITY): Payer: Medicare Other

## 2015-07-15 ENCOUNTER — Encounter (HOSPITAL_COMMUNITY): Payer: Self-pay | Admitting: Emergency Medicine

## 2015-07-15 DIAGNOSIS — I714 Abdominal aortic aneurysm, without rupture, unspecified: Secondary | ICD-10-CM

## 2015-07-15 DIAGNOSIS — Z419 Encounter for procedure for purposes other than remedying health state, unspecified: Secondary | ICD-10-CM

## 2015-07-15 DIAGNOSIS — T502X5A Adverse effect of carbonic-anhydrase inhibitors, benzothiadiazides and other diuretics, initial encounter: Secondary | ICD-10-CM | POA: Diagnosis not present

## 2015-07-15 DIAGNOSIS — S72009A Fracture of unspecified part of neck of unspecified femur, initial encounter for closed fracture: Secondary | ICD-10-CM | POA: Diagnosis not present

## 2015-07-15 DIAGNOSIS — D62 Acute posthemorrhagic anemia: Secondary | ICD-10-CM | POA: Diagnosis not present

## 2015-07-15 DIAGNOSIS — Z79899 Other long term (current) drug therapy: Secondary | ICD-10-CM

## 2015-07-15 DIAGNOSIS — Z87891 Personal history of nicotine dependence: Secondary | ICD-10-CM

## 2015-07-15 DIAGNOSIS — I5033 Acute on chronic diastolic (congestive) heart failure: Secondary | ICD-10-CM | POA: Diagnosis present

## 2015-07-15 DIAGNOSIS — E785 Hyperlipidemia, unspecified: Secondary | ICD-10-CM | POA: Diagnosis present

## 2015-07-15 DIAGNOSIS — Z8249 Family history of ischemic heart disease and other diseases of the circulatory system: Secondary | ICD-10-CM

## 2015-07-15 DIAGNOSIS — Z882 Allergy status to sulfonamides status: Secondary | ICD-10-CM

## 2015-07-15 DIAGNOSIS — G629 Polyneuropathy, unspecified: Secondary | ICD-10-CM | POA: Diagnosis present

## 2015-07-15 DIAGNOSIS — E860 Dehydration: Secondary | ICD-10-CM | POA: Diagnosis present

## 2015-07-15 DIAGNOSIS — K219 Gastro-esophageal reflux disease without esophagitis: Secondary | ICD-10-CM | POA: Diagnosis present

## 2015-07-15 DIAGNOSIS — Y92017 Garden or yard in single-family (private) house as the place of occurrence of the external cause: Secondary | ICD-10-CM

## 2015-07-15 DIAGNOSIS — M25552 Pain in left hip: Secondary | ICD-10-CM | POA: Diagnosis not present

## 2015-07-15 DIAGNOSIS — J9621 Acute and chronic respiratory failure with hypoxia: Secondary | ICD-10-CM | POA: Diagnosis not present

## 2015-07-15 DIAGNOSIS — R339 Retention of urine, unspecified: Secondary | ICD-10-CM | POA: Diagnosis not present

## 2015-07-15 DIAGNOSIS — W010XXA Fall on same level from slipping, tripping and stumbling without subsequent striking against object, initial encounter: Secondary | ICD-10-CM | POA: Diagnosis present

## 2015-07-15 DIAGNOSIS — N170 Acute kidney failure with tubular necrosis: Secondary | ICD-10-CM | POA: Diagnosis not present

## 2015-07-15 DIAGNOSIS — I509 Heart failure, unspecified: Secondary | ICD-10-CM | POA: Diagnosis not present

## 2015-07-15 DIAGNOSIS — R011 Cardiac murmur, unspecified: Secondary | ICD-10-CM | POA: Diagnosis present

## 2015-07-15 DIAGNOSIS — I10 Essential (primary) hypertension: Secondary | ICD-10-CM | POA: Diagnosis present

## 2015-07-15 DIAGNOSIS — S72002A Fracture of unspecified part of neck of left femur, initial encounter for closed fracture: Secondary | ICD-10-CM | POA: Diagnosis not present

## 2015-07-15 DIAGNOSIS — I359 Nonrheumatic aortic valve disorder, unspecified: Secondary | ICD-10-CM | POA: Diagnosis not present

## 2015-07-15 DIAGNOSIS — W19XXXA Unspecified fall, initial encounter: Secondary | ICD-10-CM

## 2015-07-15 DIAGNOSIS — Z8679 Personal history of other diseases of the circulatory system: Secondary | ICD-10-CM

## 2015-07-15 DIAGNOSIS — D696 Thrombocytopenia, unspecified: Secondary | ICD-10-CM | POA: Diagnosis present

## 2015-07-15 DIAGNOSIS — Z881 Allergy status to other antibiotic agents status: Secondary | ICD-10-CM

## 2015-07-15 DIAGNOSIS — I358 Other nonrheumatic aortic valve disorders: Secondary | ICD-10-CM | POA: Diagnosis present

## 2015-07-15 DIAGNOSIS — Z79891 Long term (current) use of opiate analgesic: Secondary | ICD-10-CM

## 2015-07-15 DIAGNOSIS — Z96649 Presence of unspecified artificial hip joint: Secondary | ICD-10-CM

## 2015-07-15 LAB — CBC WITH DIFFERENTIAL/PLATELET
Basophils Absolute: 0 10*3/uL (ref 0.0–0.1)
Basophils Relative: 0 % (ref 0–1)
EOS ABS: 0 10*3/uL (ref 0.0–0.7)
Eosinophils Relative: 0 % (ref 0–5)
HEMATOCRIT: 36.9 % (ref 36.0–46.0)
HEMOGLOBIN: 12.6 g/dL (ref 12.0–15.0)
LYMPHS ABS: 0.5 10*3/uL — AB (ref 0.7–4.0)
Lymphocytes Relative: 6 % — ABNORMAL LOW (ref 12–46)
MCH: 32.4 pg (ref 26.0–34.0)
MCHC: 34.1 g/dL (ref 30.0–36.0)
MCV: 94.9 fL (ref 78.0–100.0)
MONO ABS: 0.7 10*3/uL (ref 0.1–1.0)
MONOS PCT: 7 % (ref 3–12)
NEUTROS PCT: 87 % — AB (ref 43–77)
Neutro Abs: 8.5 10*3/uL — ABNORMAL HIGH (ref 1.7–7.7)
Platelets: 93 10*3/uL — ABNORMAL LOW (ref 150–400)
RBC: 3.89 MIL/uL (ref 3.87–5.11)
RDW: 13.8 % (ref 11.5–15.5)
WBC: 9.8 10*3/uL (ref 4.0–10.5)

## 2015-07-15 LAB — BASIC METABOLIC PANEL
Anion gap: 6 (ref 5–15)
BUN: 21 mg/dL — ABNORMAL HIGH (ref 6–20)
CALCIUM: 9.4 mg/dL (ref 8.9–10.3)
CHLORIDE: 107 mmol/L (ref 101–111)
CO2: 24 mmol/L (ref 22–32)
CREATININE: 0.83 mg/dL (ref 0.44–1.00)
GFR calc non Af Amer: >60 mL/min (ref >60)
GLUCOSE: 122 mg/dL — AB (ref 65–99)
Potassium: 3.4 mmol/L — ABNORMAL LOW (ref 3.5–5.1)
Sodium: 137 mmol/L (ref 135–145)

## 2015-07-15 LAB — TYPE AND SCREEN
ABO/RH(D): O POS
Antibody Screen: NEGATIVE

## 2015-07-15 LAB — PROTIME-INR
INR: 1.26 (ref 0.00–1.49)
PROTHROMBIN TIME: 16 s — AB (ref 11.6–15.2)

## 2015-07-15 MED ORDER — MORPHINE SULFATE (PF) 4 MG/ML IV SOLN
4.0000 mg | INTRAVENOUS | Status: DC | PRN
  Administered 2015-07-15: 4 mg via INTRAVENOUS
  Filled 2015-07-15: qty 1

## 2015-07-15 MED ORDER — ONDANSETRON HCL 4 MG/2ML IJ SOLN
4.0000 mg | Freq: Once | INTRAMUSCULAR | Status: AC
  Administered 2015-07-15: 4 mg via INTRAVENOUS
  Filled 2015-07-15: qty 2

## 2015-07-15 MED ORDER — SODIUM CHLORIDE 0.9 % IV SOLN
1000.0000 mL | Freq: Once | INTRAVENOUS | Status: DC
  Administered 2015-07-16: 1000 mL via INTRAVENOUS

## 2015-07-15 MED ORDER — FENTANYL CITRATE (PF) 100 MCG/2ML IJ SOLN
50.0000 ug | Freq: Once | INTRAMUSCULAR | Status: AC
  Administered 2015-07-15: 50 ug via INTRAVENOUS
  Filled 2015-07-15: qty 2

## 2015-07-15 MED ORDER — SODIUM CHLORIDE 0.9 % IV SOLN
1000.0000 mL | INTRAVENOUS | Status: DC
  Administered 2015-07-15: 1000 mL via INTRAVENOUS

## 2015-07-15 MED ORDER — ONDANSETRON HCL 4 MG/2ML IJ SOLN: 4.0000 mg | Freq: Once | INTRAMUSCULAR | Status: DC | PRN

## 2015-07-15 NOTE — ED Notes (Signed)
Bed: [facility]89 Expected date:  Expected time:  Means of arrival:  Comments: EMS 81 yo female/fall in yard-in yard x 8 hors/possible hip fracture

## 2015-07-15 NOTE — ED Provider Notes (Signed)
CSN: [id number]     Arrival date & time 07/15/15  2103 History   First MD Initiated Contact with Patient 07/15/15 2126     Chief Complaint  Patient presents with  . Fall   HPI The patient was out side walking in her yard this afternoon. She tripped over a hose and landed on her left hip. This fall occurred approximately 8 hours ago. The patient was unable to get up and was lying outside in the yard all day. Eventually family came to check on her found her lying on the ground. EMS was called. Patient has pain in her left hip and is unable to stand. She sustained some abrasions on her elbows but otherwise has no pain. No head injury or loss of consciousness. Past Medical History  Diagnosis Date  . Hypertension   . GERD (gastroesophageal reflux disease)   . Neuromuscular disorder   . Hyperlipidemia    Past Surgical History  Procedure Laterality Date  . Abdominal aortic aneurysm repair    . Breast biopsy    . Inguinal hernia repair    . Cholecystectomy     Family History  Problem Relation Age of Onset  . Heart disease Father    Social History  Substance Use Topics  . Smoking status: Former Smoker -- 0.50 packs/day    Types: Cigarettes    Quit date: 11/28/1991  . Smokeless tobacco: None  . Alcohol Use: No   OB History    No data available     Review of Systems  All other systems reviewed and are negative.     Allergies  Sulfa antibiotics and Cephalexin  Home Medications   Prior to Admission medications   Medication Sig Start Date End Date Taking? Authorizing Provider  ALPRAZolam Prudy Feeler) 0.5 MG tablet Take 0.25 mg by mouth at bedtime as needed.    Yes Historical Provider, MD  gabapentin (NEURONTIN) 100 MG capsule Take 100-300 mg by mouth 3 (three) times daily.    Yes Historical Provider, MD  lisinopril-hydrochlorothiazide (PRINZIDE,ZESTORETIC) 20-25 MG per tablet Take 1 tablet by mouth daily.   Yes Historical Provider, MD  pravastatin (PRAVACHOL) 40 MG tablet Take 40  mg by mouth at bedtime.   Yes Historical Provider, MD  traMADol (ULTRAM) 50 MG tablet Take 1 tablet (50 mg total) by mouth every 6 (six) hours as needed. 04/02/15  Yes Vanetta Mulders, MD  valsartan-hydrochlorothiazide (DIOVAN-HCT) 160-12.5 MG per tablet Take 1 tablet by mouth daily.     Yes Historical Provider, MD   BP 121/55 mmHg  Pulse 105  Temp(Src) 99.3 F (37.4 C) (Oral)  Resp 19  Ht  (1.753 m)  Wt 168 lb (76.204 kg)  BMI 24.80 kg/m2  SpO2 92% Physical Exam  Constitutional: No distress.  HENT:  Head: Normocephalic and atraumatic.  Right Ear: External ear normal.  Left Ear: External ear normal.  Eyes: Conjunctivae are normal. Right eye exhibits no discharge. Left eye exhibits no discharge. No scleral icterus.  Neck: Neck supple. No tracheal deviation present.  Cardiovascular: Normal rate, regular rhythm and intact distal pulses.   Pulmonary/Chest: Effort normal and breath sounds normal. No stridor. No respiratory distress. She has no wheezes. She has no rales.  Abdominal: Soft. Bowel sounds are normal. She exhibits no distension. There is no tenderness. There is no rebound and no guarding.  Musculoskeletal: She exhibits tenderness. She exhibits no edema.       Right elbow: She exhibits normal range of motion, no swelling, no  effusion and no deformity. No tenderness found.       Left elbow: She exhibits laceration. She exhibits normal range of motion, no swelling, no effusion and no deformity. No tenderness found.       Left hip: She exhibits tenderness.  Short left lower extremity; abrasions and contusions bilateral elbows, full range of motion with both elbows without any bony tenderness  Neurological: She is alert. She has normal strength. No cranial nerve deficit (no facial droop, extraocular movements intact, no slurred speech) or sensory deficit. She exhibits normal muscle tone. She displays no seizure activity. Coordination normal.  Skin: Skin is warm and dry. No rash  noted. She is not diaphoretic.  Psychiatric: She has a normal mood and affect.  Nursing note and vitals reviewed.   ED Course  Procedures (including critical care time) Labs Review Labs Reviewed  BASIC METABOLIC PANEL - Abnormal; Notable for the following:    Potassium 3.4 (*)    Glucose, Bld 122 (*)    BUN 21 (*)    All other components within normal limits  CBC WITH DIFFERENTIAL/PLATELET - Abnormal; Notable for the following:    Platelets 93 (*)    Neutrophils Relative % 87 (*)    Neutro Abs 8.5 (*)    Lymphocytes Relative 6 (*)    Lymphs Abs 0.5 (*)    All other components within normal limits  PROTIME-INR - Abnormal; Notable for the following:    Prothrombin Time 16.0 (*)    All other components within normal limits  TYPE AND SCREEN  ABO/RH    Imaging Review Dg Chest Port 1 View  07/15/2015   CLINICAL DATA:  Larey Seat.  Left femoral neck fracture.  EXAM: PORTABLE CHEST - 1 VIEW  COMPARISON:  04/02/2015.  FINDINGS: Interval mild cardiomegaly and prominent pulmonary vasculature. Stable mild prominence of the interstitial markings. No fracture or pneumothorax seen. Cervical spine degenerative changes.  IMPRESSION: Interval mild cardiomegaly and pulmonary vascular congestion with stable underlying chronic interstitial lung disease.   Electronically Signed   By: Beckie Salts M.D.   On: 07/15/2015 22:17   Dg Hip Unilat With Pelvis 2-3 Views Left  07/15/2015   CLINICAL DATA:  Left hip pain and leg rotation following a fall in her yard today.  EXAM: DG HIP (WITH OR WITHOUT PELVIS) 2-3V LEFT  COMPARISON:  None.  FINDINGS: Left femoral neck fracture with proximal displacement of the distal fragment and varus angulation. Lower lumbar spine degenerative changes.  IMPRESSION: Left femoral neck fracture, as described above.   Electronically Signed   By: Beckie Salts M.D.   On: 07/15/2015 22:06    Medications  morphine 4 MG/ML injection 4 mg (4 mg Intravenous Given 07/15/15 2311)  ondansetron  (ZOFRAN) injection 4 mg (not administered)  0.9 %  sodium chloride infusion (not administered)    Followed by  0.9 %  sodium chloride infusion (1,000 mLs Intravenous New Bag/Given 07/15/15 2220)  fentaNYL (SUBLIMAZE) injection 50 mcg (50 mcg Intravenous Given 07/15/15 2153)  ondansetron (ZOFRAN) injection 4 mg (4 mg Intravenous Given 07/15/15 2125)    MDM   Final diagnoses:  Femoral neck fracture, left, closed, initial encounter    X-rays show a left femoral neck fracture.  Labs show mild dehydration associated with her being out in the heat today. Patient will be admitted to the hospital for further treatment. I spoke with Dr. Magnus Ivan who anticipates taking the patient to the OR tomorrow afternoon.  He will come see the patient  and the family tomorrow morning.    Linwood Dibbles, MD 07/16/15 [phone]

## 2015-07-15 NOTE — H&P (Signed)
[facility] History and Physical  Patient: [name]  MRN: [id number]  DOB: 02/03/1927  DOS: the patient was seen and examined on 07/15/2015 PCP: Cassell Smiles., MD  Referring physician: Dr. Lynelle Doctor Chief Complaint: Fall  HPI: [name] is a 81 y.o. female with Past medical history of hypertension, GERD, neuropathy dyslipidemia, AAA. The patient is presenting with a mechanical fall. She mentions that she lives alone at home and she was watering her tomatoes and suddenly stepped over her water hose and fell down. She denies hitting her head but her fall was not witnessed. She denies any headache or neck pain. Denies any chest pain or shortness of breath. No abdominal pain. No nausea no vomiting. She does not use any oxygen at her baseline. She denies any burning urination. Family denies any recent change in her medication.  The patient [place]  At her baseline ambulates with walker And is independent for most of her ADL does manages her medication on her own.  Review of Systems: as mentioned in the history of present illness.  A comprehensive review of the other systems is negative.  Past Medical History  Diagnosis Date  . Hypertension   . GERD (gastroesophageal reflux disease)   . Neuromuscular disorder   . Hyperlipidemia    Past Surgical History  Procedure Laterality Date  . Abdominal aortic aneurysm repair    . Breast biopsy    . Inguinal hernia repair    . Cholecystectomy     Social History:  reports that she quit smoking about 23 years ago. Her smoking use included Cigarettes. She smoked 0.50 packs per day. She does not have any smokeless tobacco history on file. She reports that she does not drink alcohol or use illicit drugs.  Allergies  Allergen Reactions  . Sulfa Antibiotics Anaphylaxis  . Cephalexin Itching and Rash    Family History  Problem Relation Age of Onset  . Heart disease Father     Prior to Admission medications     Medication Sig Start Date End Date Taking? Authorizing Provider  ALPRAZolam Prudy Feeler) 0.5 MG tablet Take 0.25 mg by mouth at bedtime as needed.    Yes Historical Provider, MD  gabapentin (NEURONTIN) 100 MG capsule Take 100-300 mg by mouth 3 (three) times daily.    Yes Historical Provider, MD  lisinopril-hydrochlorothiazide (PRINZIDE,ZESTORETIC) 20-25 MG per tablet Take 1 tablet by mouth daily.   Yes Historical Provider, MD  pravastatin (PRAVACHOL) 40 MG tablet Take 40 mg by mouth at bedtime.   Yes Historical Provider, MD  traMADol (ULTRAM) 50 MG tablet Take 1 tablet (50 mg total) by mouth every 6 (six) hours as needed. 04/02/15  Yes Vanetta Mulders, MD  valsartan-hydrochlorothiazide (DIOVAN-HCT) 160-12.5 MG per tablet Take 1 tablet by mouth daily.     Yes Historical Provider, MD    Physical Exam: Filed Vitals:   07/15/15 2134 07/15/15 2200 07/15/15 2230 07/15/15 2330  BP:  152/62 146/61 121/55  Pulse:  104 105 105  Temp:      TempSrc:      Resp:  12 13 19   Height:      Weight:      SpO2: 94% 94% 93% 92%    General: Alert, Awake and Oriented to Time, Place and Person. Appear in mild distress Eyes: PERRL ENT: Oral Mucosa clear moist. Neck: no JVD Cardiovascular: S1 and S2 Present, aortic systolic Murmur, Peripheral Pulses Present Respiratory: Bilateral Air entry equal and Decreased,  Faint basal  Crackles, no wheezes Abdomen: Bowel Sound present, Soft and non tenderness Skin: no Rash Extremities: no Pedal edema, no calf tenderness Neurologic: Grossly no focal neuro deficit.  Labs on Admission:  CBC:  Recent Labs Lab 07/15/15 2230  WBC 9.8  NEUTROABS 8.5*  HGB 12.6  HCT 36.9  MCV 94.9  PLT 93*    CMP     Component Value Date/Time   NA 137 07/15/2015 2230   K 3.4* 07/15/2015 2230   CL 107 07/15/2015 2230   CO2 24 07/15/2015 2230   GLUCOSE 122* 07/15/2015 2230   BUN 21* 07/15/2015 2230   CREATININE 0.83 07/15/2015 2230   CALCIUM 9.4 07/15/2015 2230   PROT 6.8  10/21/2008 0913   ALBUMIN 3.3* 10/21/2008 0913   AST 42* 10/21/2008 0913   ALT 28 10/21/2008 0913   ALKPHOS 75 10/21/2008 0913   BILITOT 1.0 10/21/2008 0913   GFRNONAA >60 07/15/2015 2230   GFRAA >60 07/15/2015 2230    No results for input(s): LIPASE, AMYLASE in the last 168 hours.  No results for input(s): CKTOTAL, CKMB, CKMBINDEX, TROPONINI in the last 168 hours. BNP (last 3 results) No results for input(s): BNP in the last 8760 hours.  ProBNP (last 3 results) No results for input(s): PROBNP in the last 8760 hours.   Radiological Exams on Admission: Dg Chest Port 1 View  07/15/2015   CLINICAL DATA:  Larey Seat.  Left femoral neck fracture.  EXAM: PORTABLE CHEST - 1 VIEW  COMPARISON:  04/02/2015.  FINDINGS: Interval mild cardiomegaly and prominent pulmonary vasculature. Stable mild prominence of the interstitial markings. No fracture or pneumothorax seen. Cervical spine degenerative changes.  IMPRESSION: Interval mild cardiomegaly and pulmonary vascular congestion with stable underlying chronic interstitial lung disease.   Electronically Signed   By: Beckie Salts M.D.   On: 07/15/2015 22:17   Dg Hip Unilat With Pelvis 2-3 Views Left  07/15/2015   CLINICAL DATA:  Left hip pain and leg rotation following a fall in her yard today.  EXAM: DG HIP (WITH OR WITHOUT PELVIS) 2-3V LEFT  COMPARISON:  None.  FINDINGS: Left femoral neck fracture with proximal displacement of the distal fragment and varus angulation. Lower lumbar spine degenerative changes.  IMPRESSION: Left femoral neck fracture, as described above.   Electronically Signed   By: Beckie Salts M.D.   On: 07/15/2015 22:06   Assessment/Plan Principal Problem:   Hip fracture Active Problems:   Abdominal aortic aneurysm   Aortic heart murmur   Chronic CHF   Essential hypertension   Neuropathy   1. Hip fracture The patient is presenting with a mechanical fall. The fall was not witnessed. The patient was awake and oriented after the  fall when she was found by family. I will check CT head and C-spine. Orthopedic Dr. Rayburn Ma has been consulted and will be following up on the patient. Nonweightbearing as well as when necessary pain management. Foley catheter is also inserted.  2.A) Cardiac risk: Based on RCRI  >History of HF  With this the patient is a moderate to high risk for adverse Cardiac outcome from surgery. Recommend further work up with echocardiogram, may need cardiology consultation prior to surgery. Be watchful of hydration since the pt has history of CHF. Monitor Ins and Out. Add aspirin, Hold lisinopril and diuretics.  B) Pulmonary risk: Recommend continue use of PRN nebulizer, and optimization of lung function with use of inhalers and incentive spirometry. Good pulmunary toilet.  C) General risk: Avoid major fluctuation in blood  pressure intra-op and post operatively. Minimal sedation and Narcotics.  Will request Surgeon to please Order Lovenox/DVT prophylaxis of his/her choice when OK from Surgeon's standpoint post op.   3. Chronic neuropathy. Continuing home medication.  4. Possible chronic CHF with acute component. Check echocardiogram in the morning.  5. Essential hypertension. The patient has 2 separate prescriptions of ACE inhibitor as well as ARB along with hydrochlorothiazide. Currently I'm holding both of them.   Advance goals of care discussion: Full code at present. Initially the patient mentions that she does not want to be verified but later on said I don't know, as she was unsure she was placed as full code and recommended that the family and social worker consult about goals of care   Consults: Orthopedics  DVT Prophylaxis: mechanical compression device  Nutrition: Nothing by mouth after breakfast  Family Communication: family was present at bedside, opportunity was given to ask question and all questions were answered satisfactorily at the time of  interview. Disposition: Admitted as inpatient, telemetry unit.  Author: Lynden Oxford, MD [facility] Pager: [phone] 07/15/2015  If 7PM-7AM, please contact night-coverage [email or web address] Password [email or web address]

## 2015-07-15 NOTE — ED Notes (Signed)
Pt presents from home via EMS for fall. Pt reports fall approximately 1230 today and c/o left hip pain with bruising to same.   20g left AC, 150cc NS in route.   Last VS: 135/63, 112hr, 16resp, 95% on 2L [place], 88% on RA

## 2015-07-16 ENCOUNTER — Inpatient Hospital Stay (HOSPITAL_COMMUNITY): Payer: Medicare Other

## 2015-07-16 ENCOUNTER — Encounter (HOSPITAL_COMMUNITY): Payer: Self-pay

## 2015-07-16 DIAGNOSIS — W010XXA Fall on same level from slipping, tripping and stumbling without subsequent striking against object, initial encounter: Secondary | ICD-10-CM | POA: Diagnosis present

## 2015-07-16 DIAGNOSIS — K219 Gastro-esophageal reflux disease without esophagitis: Secondary | ICD-10-CM | POA: Diagnosis present

## 2015-07-16 DIAGNOSIS — G629 Polyneuropathy, unspecified: Secondary | ICD-10-CM | POA: Diagnosis present

## 2015-07-16 DIAGNOSIS — Z79899 Other long term (current) drug therapy: Secondary | ICD-10-CM | POA: Diagnosis not present

## 2015-07-16 DIAGNOSIS — Z882 Allergy status to sulfonamides status: Secondary | ICD-10-CM | POA: Diagnosis not present

## 2015-07-16 DIAGNOSIS — D62 Acute posthemorrhagic anemia: Secondary | ICD-10-CM | POA: Diagnosis not present

## 2015-07-16 DIAGNOSIS — R339 Retention of urine, unspecified: Secondary | ICD-10-CM | POA: Diagnosis not present

## 2015-07-16 DIAGNOSIS — I10 Essential (primary) hypertension: Secondary | ICD-10-CM | POA: Diagnosis present

## 2015-07-16 DIAGNOSIS — S72002A Fracture of unspecified part of neck of left femur, initial encounter for closed fracture: Secondary | ICD-10-CM | POA: Diagnosis not present

## 2015-07-16 DIAGNOSIS — R011 Cardiac murmur, unspecified: Secondary | ICD-10-CM | POA: Diagnosis present

## 2015-07-16 DIAGNOSIS — M25552 Pain in left hip: Secondary | ICD-10-CM | POA: Diagnosis present

## 2015-07-16 DIAGNOSIS — I359 Nonrheumatic aortic valve disorder, unspecified: Secondary | ICD-10-CM

## 2015-07-16 DIAGNOSIS — S72009A Fracture of unspecified part of neck of unspecified femur, initial encounter for closed fracture: Secondary | ICD-10-CM | POA: Diagnosis present

## 2015-07-16 DIAGNOSIS — Z8679 Personal history of other diseases of the circulatory system: Secondary | ICD-10-CM | POA: Diagnosis not present

## 2015-07-16 DIAGNOSIS — Z01810 Encounter for preprocedural cardiovascular examination: Secondary | ICD-10-CM

## 2015-07-16 DIAGNOSIS — E785 Hyperlipidemia, unspecified: Secondary | ICD-10-CM | POA: Diagnosis present

## 2015-07-16 DIAGNOSIS — Y92017 Garden or yard in single-family (private) house as the place of occurrence of the external cause: Secondary | ICD-10-CM | POA: Diagnosis not present

## 2015-07-16 DIAGNOSIS — D696 Thrombocytopenia, unspecified: Secondary | ICD-10-CM | POA: Diagnosis present

## 2015-07-16 DIAGNOSIS — I509 Heart failure, unspecified: Secondary | ICD-10-CM | POA: Diagnosis not present

## 2015-07-16 DIAGNOSIS — I714 Abdominal aortic aneurysm, without rupture: Secondary | ICD-10-CM | POA: Diagnosis present

## 2015-07-16 DIAGNOSIS — Z87891 Personal history of nicotine dependence: Secondary | ICD-10-CM | POA: Diagnosis not present

## 2015-07-16 DIAGNOSIS — T502X5A Adverse effect of carbonic-anhydrase inhibitors, benzothiadiazides and other diuretics, initial encounter: Secondary | ICD-10-CM | POA: Diagnosis not present

## 2015-07-16 DIAGNOSIS — E860 Dehydration: Secondary | ICD-10-CM | POA: Diagnosis present

## 2015-07-16 DIAGNOSIS — I5033 Acute on chronic diastolic (congestive) heart failure: Secondary | ICD-10-CM | POA: Diagnosis not present

## 2015-07-16 DIAGNOSIS — Z8249 Family history of ischemic heart disease and other diseases of the circulatory system: Secondary | ICD-10-CM | POA: Diagnosis not present

## 2015-07-16 DIAGNOSIS — Z881 Allergy status to other antibiotic agents status: Secondary | ICD-10-CM | POA: Diagnosis not present

## 2015-07-16 DIAGNOSIS — Z79891 Long term (current) use of opiate analgesic: Secondary | ICD-10-CM | POA: Diagnosis not present

## 2015-07-16 DIAGNOSIS — N170 Acute kidney failure with tubular necrosis: Secondary | ICD-10-CM | POA: Diagnosis not present

## 2015-07-16 DIAGNOSIS — J9621 Acute and chronic respiratory failure with hypoxia: Secondary | ICD-10-CM | POA: Diagnosis not present

## 2015-07-16 DIAGNOSIS — I358 Other nonrheumatic aortic valve disorders: Secondary | ICD-10-CM | POA: Diagnosis present

## 2015-07-16 LAB — CBC WITH DIFFERENTIAL/PLATELET
BASOS ABS: 0 10*3/uL (ref 0.0–0.1)
Basophils Relative: 0 % (ref 0–1)
Eosinophils Absolute: 0 10*3/uL (ref 0.0–0.7)
Eosinophils Relative: 0 % (ref 0–5)
HEMATOCRIT: 37.3 % (ref 36.0–46.0)
HEMOGLOBIN: 12.6 g/dL (ref 12.0–15.0)
LYMPHS PCT: 6 % — AB (ref 12–46)
Lymphs Abs: 0.9 10*3/uL (ref 0.7–4.0)
MCH: 32.3 pg (ref 26.0–34.0)
MCHC: 33.8 g/dL (ref 30.0–36.0)
MCV: 95.6 fL (ref 78.0–100.0)
Monocytes Absolute: 0.9 10*3/uL (ref 0.1–1.0)
Monocytes Relative: 6 % (ref 3–12)
NEUTROS ABS: 12.3 10*3/uL — AB (ref 1.7–7.7)
NEUTROS PCT: 87 % — AB (ref 43–77)
PLATELETS: 91 10*3/uL — AB (ref 150–400)
RBC: 3.9 MIL/uL (ref 3.87–5.11)
RDW: 13.9 % (ref 11.5–15.5)
WBC: 14.2 10*3/uL — AB (ref 4.0–10.5)

## 2015-07-16 LAB — COMPREHENSIVE METABOLIC PANEL
ALT: 28 U/L (ref 14–54)
AST: 55 U/L — AB (ref 15–41)
Albumin: 2.8 g/dL — ABNORMAL LOW (ref 3.5–5.0)
Alkaline Phosphatase: 77 U/L (ref 38–126)
Anion gap: 6 (ref 5–15)
BUN: 24 mg/dL — AB (ref 6–20)
CHLORIDE: 104 mmol/L (ref 101–111)
CO2: 27 mmol/L (ref 22–32)
CREATININE: 1.04 mg/dL — AB (ref 0.44–1.00)
Calcium: 9.3 mg/dL (ref 8.9–10.3)
GFR calc Af Amer: 55 mL/min — ABNORMAL LOW (ref >60)
GFR, EST NON AFRICAN AMERICAN: 47 mL/min — AB (ref >60)
Glucose, Bld: 123 mg/dL — ABNORMAL HIGH (ref 65–99)
Potassium: 3.8 mmol/L (ref 3.5–5.1)
Sodium: 137 mmol/L (ref 135–145)
Total Bilirubin: 2.9 mg/dL — ABNORMAL HIGH (ref 0.3–1.2)
Total Protein: 6.6 g/dL (ref 6.5–8.1)

## 2015-07-16 LAB — CK: Total CK: 234 U/L (ref 38–234)

## 2015-07-16 LAB — PROTIME-INR
INR: 1.37 (ref 0.00–1.49)
Prothrombin Time: 17 seconds — ABNORMAL HIGH (ref 11.6–15.2)

## 2015-07-16 LAB — TROPONIN I
TROPONIN I: 0.04 ng/mL — AB (ref <0.031)
Troponin I: 0.04 ng/mL — ABNORMAL HIGH (ref <0.031)
Troponin I: 0.04 ng/mL — ABNORMAL HIGH (ref <0.031)
Troponin I: 0.05 ng/mL — ABNORMAL HIGH (ref <0.031)

## 2015-07-16 LAB — BRAIN NATRIURETIC PEPTIDE: B Natriuretic Peptide: 299.5 pg/mL — ABNORMAL HIGH (ref 0.0–100.0)

## 2015-07-16 LAB — ABO/RH: ABO/RH(D): O POS

## 2015-07-16 LAB — MRSA PCR SCREENING: MRSA BY PCR: NEGATIVE

## 2015-07-16 MED ORDER — KETOROLAC TROMETHAMINE 15 MG/ML IJ SOLN
15.0000 mg | Freq: Three times a day (TID) | INTRAMUSCULAR | Status: DC | PRN
  Administered 2015-07-16: 15 mg via INTRAVENOUS
  Filled 2015-07-16: qty 1

## 2015-07-16 MED ORDER — FUROSEMIDE 10 MG/ML IJ SOLN
40.0000 mg | Freq: Two times a day (BID) | INTRAMUSCULAR | Status: DC
  Administered 2015-07-16: 40 mg via INTRAVENOUS
  Filled 2015-07-16: qty 4

## 2015-07-16 MED ORDER — LACTATED RINGERS IV SOLN
INTRAVENOUS | Status: DC
  Administered 2015-07-16 – 2015-07-17 (×2): via INTRAVENOUS

## 2015-07-16 MED ORDER — OXYCODONE HCL 5 MG PO TABS
5.0000 mg | ORAL_TABLET | ORAL | Status: DC | PRN
  Administered 2015-07-16 – 2015-07-18 (×3): 5 mg via ORAL
  Filled 2015-07-16 (×3): qty 1

## 2015-07-16 MED ORDER — HEPARIN SODIUM (PORCINE) 5000 UNIT/ML IJ SOLN
5000.0000 [IU] | Freq: Three times a day (TID) | INTRAMUSCULAR | Status: DC
  Administered 2015-07-16 – 2015-07-20 (×9): 5000 [IU] via SUBCUTANEOUS
  Filled 2015-07-16 (×15): qty 1

## 2015-07-16 MED ORDER — GABAPENTIN 300 MG PO CAPS
300.0000 mg | ORAL_CAPSULE | Freq: Every day | ORAL | Status: DC
  Administered 2015-07-16 – 2015-07-19 (×4): 300 mg via ORAL
  Filled 2015-07-16 (×4): qty 1

## 2015-07-16 MED ORDER — DEXTROSE 5 % IV SOLN
500.0000 mg | Freq: Four times a day (QID) | INTRAVENOUS | Status: DC | PRN
  Filled 2015-07-16: qty 5

## 2015-07-16 MED ORDER — TRAMADOL HCL 50 MG PO TABS: 25.0000 mg | ORAL_TABLET | Freq: Four times a day (QID) | ORAL | Status: DC | PRN

## 2015-07-16 MED ORDER — ASPIRIN EC 81 MG PO TBEC
81.0000 mg | DELAYED_RELEASE_TABLET | Freq: Every day | ORAL | Status: DC
  Administered 2015-07-16 – 2015-07-17 (×2): 81 mg via ORAL
  Filled 2015-07-16 (×2): qty 1

## 2015-07-16 MED ORDER — ACETAMINOPHEN 325 MG PO TABS: 650.0000 mg | ORAL_TABLET | Freq: Four times a day (QID) | ORAL | Status: DC | PRN

## 2015-07-16 MED ORDER — PRAVASTATIN SODIUM 40 MG PO TABS
40.0000 mg | ORAL_TABLET | Freq: Every day | ORAL | Status: DC
  Administered 2015-07-17 – 2015-07-19 (×3): 40 mg via ORAL
  Filled 2015-07-16 (×3): qty 1

## 2015-07-16 MED ORDER — METHOCARBAMOL 500 MG PO TABS
500.0000 mg | ORAL_TABLET | Freq: Four times a day (QID) | ORAL | Status: DC | PRN
  Administered 2015-07-16: 500 mg via ORAL
  Filled 2015-07-16: qty 1

## 2015-07-16 MED ORDER — FUROSEMIDE 20 MG PO TABS
20.0000 mg | ORAL_TABLET | Freq: Two times a day (BID) | ORAL | Status: DC
  Administered 2015-07-17: 20 mg via ORAL
  Filled 2015-07-16: qty 1

## 2015-07-16 MED ORDER — ACETAMINOPHEN 325 MG PO TABS
650.0000 mg | ORAL_TABLET | Freq: Four times a day (QID) | ORAL | Status: DC | PRN
  Administered 2015-07-16: 650 mg via ORAL
  Filled 2015-07-16: qty 2

## 2015-07-16 MED ORDER — GABAPENTIN 100 MG PO CAPS
100.0000 mg | ORAL_CAPSULE | Freq: Two times a day (BID) | ORAL | Status: DC
  Administered 2015-07-16 – 2015-07-20 (×9): 100 mg via ORAL
  Filled 2015-07-16 (×10): qty 1

## 2015-07-16 NOTE — Progress Notes (Signed)
*  PRELIMINARY RESULTS* Echocardiogram 2D Echocardiogram has been performed.  [name] 07/16/2015, 10:06 AM

## 2015-07-16 NOTE — Consult Note (Signed)
Reason for Consult:  Left hip fracture Referring Physician: EDP  [name] is an 81 y.o. female.  HPI:   81 yo female sustained an accidental mechanical fall in her garden yesterday.  Was down for awhile.  Was found and transported to [facility] ED and found to have a left hip fracture.  She does have good family support and usually ambulates with a walker.  She does report left hip pain.  Past Medical History  Diagnosis Date  . Hypertension   . GERD (gastroesophageal reflux disease)   . Neuromuscular disorder   . Hyperlipidemia     Past Surgical History  Procedure Laterality Date  . Abdominal aortic aneurysm repair    . Breast biopsy    . Inguinal hernia repair    . Cholecystectomy      Family History  Problem Relation Age of Onset  . Heart disease Father     Social History:  reports that she quit smoking about 23 years ago. Her smoking use included Cigarettes. She smoked 0.50 packs per day. She does not have any smokeless tobacco history on file. She reports that she does not drink alcohol or use illicit drugs.  Allergies:  Allergies  Allergen Reactions  . Sulfa Antibiotics Anaphylaxis  . Cephalexin Itching and Rash    Medications: I have reviewed the patient's current medications.  Results for orders placed or performed during the hospital encounter of 07/15/15 (from the past 48 hour(s))  Basic metabolic panel     Status: Abnormal   Collection Time: 07/15/15 10:30 PM  Result Value Ref Range   Sodium 137 135 - 145 mmol/L   Potassium 3.4 (L) 3.5 - 5.1 mmol/L   Chloride 107 101 - 111 mmol/L   CO2 24 22 - 32 mmol/L   Glucose, Bld 122 (H) 65 - 99 mg/dL   BUN 21 (H) 6 - 20 mg/dL   Creatinine, Ser 0.83 0.44 - 1.00 mg/dL   Calcium 9.4 8.9 - 10.3 mg/dL   GFR calc non Af Amer >60 >60 mL/min   GFR calc Af Amer >60 >60 mL/min    Comment: (NOTE) The eGFR has been calculated using the CKD EPI equation. This calculation has not been validated in all clinical  situations. eGFR's persistently <60 mL/min signify possible Chronic Kidney Disease.    Anion gap 6 5 - 15  CBC WITH DIFFERENTIAL     Status: Abnormal   Collection Time: 07/15/15 10:30 PM  Result Value Ref Range   WBC 9.8 4.0 - 10.5 K/uL   RBC 3.89 3.87 - 5.11 MIL/uL   Hemoglobin 12.6 12.0 - 15.0 g/dL   HCT 36.9 36.0 - 46.0 %   MCV 94.9 78.0 - 100.0 fL   MCH 32.4 26.0 - 34.0 pg   MCHC 34.1 30.0 - 36.0 g/dL   RDW 13.8 11.5 - 15.5 %   Platelets 93 (L) 150 - 400 K/uL    Comment: REPEATED TO VERIFY SPECIMEN CHECKED FOR CLOTS PLATELET COUNT CONFIRMED BY SMEAR    Neutrophils Relative % 87 (H) 43 - 77 %   Neutro Abs 8.5 (H) 1.7 - 7.7 K/uL   Lymphocytes Relative 6 (L) 12 - 46 %   Lymphs Abs 0.5 (L) 0.7 - 4.0 K/uL   Monocytes Relative 7 3 - 12 %   Monocytes Absolute 0.7 0.1 - 1.0 K/uL   Eosinophils Relative 0 0 - 5 %   Eosinophils Absolute 0.0 0.0 - 0.7 K/uL   Basophils Relative 0  0 - 1 %   Basophils Absolute 0.0 0.0 - 0.1 K/uL  Protime-INR     Status: Abnormal   Collection Time: 07/15/15 10:30 PM  Result Value Ref Range   Prothrombin Time 16.0 (H) 11.6 - 15.2 seconds   INR 1.26 0.00 - 1.49  Type and screen     Status: None   Collection Time: 07/15/15 10:30 PM  Result Value Ref Range   ABO/RH(D) O POS    Antibody Screen NEG    Sample Expiration 07/18/2015   ABO/Rh     Status: None   Collection Time: 07/15/15 11:30 PM  Result Value Ref Range   ABO/RH(D) O POS   Brain natriuretic peptide     Status: Abnormal   Collection Time: 07/16/15 12:45 AM  Result Value Ref Range   B Natriuretic Peptide 299.5 (H) 0.0 - 100.0 pg/mL  Troponin I (q 6hr x 3)     Status: Abnormal   Collection Time: 07/16/15 12:45 AM  Result Value Ref Range   Troponin I 0.04 (H) <0.031 ng/mL    Comment:        PERSISTENTLY INCREASED TROPONIN VALUES IN THE RANGE OF 0.04-0.49 ng/mL CAN BE SEEN IN:       -UNSTABLE ANGINA       -CONGESTIVE HEART FAILURE       -MYOCARDITIS       -CHEST TRAUMA        -ARRYHTHMIAS       -LATE PRESENTING MYOCARDIAL INFARCTION       -COPD   CLINICAL FOLLOW-UP RECOMMENDED.   Troponin I (q 6hr x 3)     Status: Abnormal   Collection Time: 07/16/15  5:16 AM  Result Value Ref Range   Troponin I 0.04 (H) <0.031 ng/mL    Comment:        PERSISTENTLY INCREASED TROPONIN VALUES IN THE RANGE OF 0.04-0.49 ng/mL CAN BE SEEN IN:       -UNSTABLE ANGINA       -CONGESTIVE HEART FAILURE       -MYOCARDITIS       -CHEST TRAUMA       -ARRYHTHMIAS       -LATE PRESENTING MYOCARDIAL INFARCTION       -COPD   CLINICAL FOLLOW-UP RECOMMENDED.   CBC with Differential/Platelet     Status: Abnormal   Collection Time: 07/16/15  5:16 AM  Result Value Ref Range   WBC 14.2 (H) 4.0 - 10.5 K/uL   RBC 3.90 3.87 - 5.11 MIL/uL   Hemoglobin 12.6 12.0 - 15.0 g/dL   HCT 37.3 36.0 - 46.0 %   MCV 95.6 78.0 - 100.0 fL   MCH 32.3 26.0 - 34.0 pg   MCHC 33.8 30.0 - 36.0 g/dL   RDW 13.9 11.5 - 15.5 %   Platelets 91 (L) 150 - 400 K/uL    Comment: CONSISTENT WITH PREVIOUS RESULT   Neutrophils Relative % 87 (H) 43 - 77 %   Neutro Abs 12.3 (H) 1.7 - 7.7 K/uL   Lymphocytes Relative 6 (L) 12 - 46 %   Lymphs Abs 0.9 0.7 - 4.0 K/uL   Monocytes Relative 6 3 - 12 %   Monocytes Absolute 0.9 0.1 - 1.0 K/uL   Eosinophils Relative 0 0 - 5 %   Eosinophils Absolute 0.0 0.0 - 0.7 K/uL   Basophils Relative 0 0 - 1 %   Basophils Absolute 0.0 0.0 - 0.1 K/uL  Comprehensive metabolic panel     Status: Abnormal  Collection Time: 07/16/15  5:16 AM  Result Value Ref Range   Sodium 137 135 - 145 mmol/L   Potassium 3.8 3.5 - 5.1 mmol/L   Chloride 104 101 - 111 mmol/L   CO2 27 22 - 32 mmol/L   Glucose, Bld 123 (H) 65 - 99 mg/dL   BUN 24 (H) 6 - 20 mg/dL   Creatinine, Ser 1.04 (H) 0.44 - 1.00 mg/dL   Calcium 9.3 8.9 - 10.3 mg/dL   Total Protein 6.6 6.5 - 8.1 g/dL   Albumin 2.8 (L) 3.5 - 5.0 g/dL   AST 55 (H) 15 - 41 U/L   ALT 28 14 - 54 U/L   Alkaline Phosphatase 77 38 - 126 U/L   Total Bilirubin  2.9 (H) 0.3 - 1.2 mg/dL   GFR calc non Af Amer 47 (L) >60 mL/min   GFR calc Af Amer 55 (L) >60 mL/min    Comment: (NOTE) The eGFR has been calculated using the CKD EPI equation. This calculation has not been validated in all clinical situations. eGFR's persistently <60 mL/min signify possible Chronic Kidney Disease.    Anion gap 6 5 - 15  MRSA PCR Screening     Status: None   Collection Time: 07/16/15  6:05 AM  Result Value Ref Range   MRSA by PCR NEGATIVE NEGATIVE    Comment:        The GeneXpert MRSA Assay (FDA approved for NASAL specimens only), is one component of a comprehensive MRSA colonization surveillance program. It is not intended to diagnose MRSA infection nor to guide or monitor treatment for MRSA infections.   Troponin I (q 6hr x 3)     Status: Abnormal   Collection Time: 07/16/15 11:26 AM  Result Value Ref Range   Troponin I 0.05 (H) <0.031 ng/mL    Comment:        PERSISTENTLY INCREASED TROPONIN VALUES IN THE RANGE OF 0.04-0.49 ng/mL CAN BE SEEN IN:       -UNSTABLE ANGINA       -CONGESTIVE HEART FAILURE       -MYOCARDITIS       -CHEST TRAUMA       -ARRYHTHMIAS       -LATE PRESENTING MYOCARDIAL INFARCTION       -COPD   CLINICAL FOLLOW-UP RECOMMENDED.   Protime-INR     Status: Abnormal   Collection Time: 07/16/15 11:26 AM  Result Value Ref Range   Prothrombin Time 17.0 (H) 11.6 - 15.2 seconds   INR 1.37 0.00 - 1.49  CK     Status: None   Collection Time: 07/16/15 11:26 AM  Result Value Ref Range   Total CK 234 38 - 234 U/L    Ct Head Wo Contrast  07/16/2015   CLINICAL DATA:  Fall with hip fracture  EXAM: CT HEAD WITHOUT CONTRAST  CT CERVICAL SPINE WITHOUT CONTRAST  TECHNIQUE: Multidetector CT imaging of the head and cervical spine was performed following the standard protocol without intravenous contrast. Multiplanar CT image reconstructions of the cervical spine were also generated.  COMPARISON:  05/27/2010 head CT  FINDINGS: CT HEAD FINDINGS   Skull and Sinuses:Negative for fracture or destructive process. The mastoids, middle ears, and imaged paranasal sinuses are clear.  Orbits: No acute abnormality.  Brain: No evidence of acute infarction, hemorrhage, hydrocephalus, or mass lesion/mass effect. Cerebral volume is within normal limits for age. Probable remote lacunar infarct in the left caudate head.  CT CERVICAL SPINE FINDINGS  Negative for acute  fracture or subluxation. No prevertebral edema. No gross cervical canal hematoma. No significant osseous canal or foraminal stenosis.  Biapical emphysema.  IMPRESSION: No evidence of intracranial or cervical spine injury.   Electronically Signed   By: Monte Fantasia M.D.   On: 07/16/2015 04:59   Ct Cervical Spine Wo Contrast  07/16/2015   CLINICAL DATA:  Fall with hip fracture  EXAM: CT HEAD WITHOUT CONTRAST  CT CERVICAL SPINE WITHOUT CONTRAST  TECHNIQUE: Multidetector CT imaging of the head and cervical spine was performed following the standard protocol without intravenous contrast. Multiplanar CT image reconstructions of the cervical spine were also generated.  COMPARISON:  05/27/2010 head CT  FINDINGS: CT HEAD FINDINGS  Skull and Sinuses:Negative for fracture or destructive process. The mastoids, middle ears, and imaged paranasal sinuses are clear.  Orbits: No acute abnormality.  Brain: No evidence of acute infarction, hemorrhage, hydrocephalus, or mass lesion/mass effect. Cerebral volume is within normal limits for age. Probable remote lacunar infarct in the left caudate head.  CT CERVICAL SPINE FINDINGS  Negative for acute fracture or subluxation. No prevertebral edema. No gross cervical canal hematoma. No significant osseous canal or foraminal stenosis.  Biapical emphysema.  IMPRESSION: No evidence of intracranial or cervical spine injury.   Electronically Signed   By: Monte Fantasia M.D.   On: 07/16/2015 04:59   US Aorta  07/16/2015   CLINICAL DATA:  Abdominal aortic aneurysm  EXAM: ULTRASOUND  OF ABDOMINAL AORTA  TECHNIQUE: Ultrasound examination of the abdominal aorta was performed to evaluate for abdominal aortic aneurysm.  COMPARISON:  None.  FINDINGS: Known abdominal aortic aneurysm which is infrarenal. Maximal dimensions are 32 mm AP and 35 mm transverse. This is only 1 mm larger than in 2013 by CT. The bilateral common iliac arteries have normal diameter of 13 and 15 mm. The proximal most aorta is obscured by bowel gas, but this area was non aneurysmal on abdominal CT in 2013. No gross indication of aortic dissection.  IMPRESSION: 35 mm infrarenal aortic aneurysm, 1 mm larger than in 2013.   Electronically Signed   By: Monte Fantasia M.D.   On: 07/16/2015 05:08   Dg Chest Port 1 View  07/15/2015   CLINICAL DATA:  [place].  Left femoral neck fracture.  EXAM: PORTABLE CHEST - 1 VIEW  COMPARISON:  04/02/2015.  FINDINGS: Interval mild cardiomegaly and prominent pulmonary vasculature. Stable mild prominence of the interstitial markings. No fracture or pneumothorax seen. Cervical spine degenerative changes.  IMPRESSION: Interval mild cardiomegaly and pulmonary vascular congestion with stable underlying chronic interstitial lung disease.   Electronically Signed   By: Claudie Revering M.D.   On: 07/15/2015 22:17   Dg Hip Unilat With Pelvis 2-3 Views Left  07/15/2015   CLINICAL DATA:  Left hip pain and leg rotation following a fall in her yard today.  EXAM: DG HIP (WITH OR WITHOUT PELVIS) 2-3V LEFT  COMPARISON:  None.  FINDINGS: Left femoral neck fracture with proximal displacement of the distal fragment and varus angulation. Lower lumbar spine degenerative changes.  IMPRESSION: Left femoral neck fracture, as described above.   Electronically Signed   By: Claudie Revering M.D.   On: 07/15/2015 22:06    ROS Blood pressure 117/53, pulse 105, temperature 97.3 F (36.3 C), temperature source Axillary, resp. rate 19, height '5\' 9"'  (1.753 m), weight 74.1 kg (163 lb 5.8 oz), SpO2 95 %. Physical Exam   Constitutional: She appears well-developed.  HENT:  Head: Normocephalic and atraumatic.  Right Ear: Decreased  hearing is noted.  Left Ear: Decreased hearing is noted.  Neck: Normal range of motion. Neck supple.  Cardiovascular: Normal rate.   Respiratory: Effort normal.  GI: Soft.  Musculoskeletal:       Left hip: She exhibits decreased range of motion, bony tenderness and deformity.  Neurological: She is alert.  Skin: Skin is warm and dry.    Assessment/Plan: Left hip femoral neck fracture 1)  I have spoken with her as well as family at the bedside.  We are recommending a left partial hip replacement in order to decrease her pain and improve her quality of life.  Certainly risks involved given her frail health.  Will tentatively plan on surgery tomorrow 8/20.  Sabella Traore Y 07/16/2015, 1:06 PM
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
## 2015-07-16 NOTE — Clinical Social Work Placement (Signed)
   CLINICAL SOCIAL WORK PLACEMENT  NOTE  Date:  07/16/2015  Patient Details  Name: [name] MRN: [id number] Date of Birth: Jul 17, 1927  Clinical Social Work is seeking post-discharge placement for this patient at the Skilled  Nursing Facility level of care (*CSW will initial, date and re-position this form in  chart as items are completed):  Yes   Patient/family provided with [place] Clinical Social Work Department's list of facilities offering this level of care within the geographic area requested by the patient (or if unable, by the patient's family).  Yes   Patient/family informed of their freedom to choose among providers that offer the needed level of care, that participate in Medicare, Medicaid or managed care program needed by the patient, have an available bed and are willing to accept the patient.  Yes   Patient/family informed of [facility]'s ownership interest in [facility] and [facility], as well as of the fact that they are under no obligation to receive care at these facilities.  PASRR submitted to EDS on 07/16/15     PASRR number received on 07/16/15     Existing PASRR number confirmed on       FL2 transmitted to all facilities in geographic area requested by pt/family on 07/16/15     FL2 transmitted to all facilities within larger geographic area on       Patient informed that his/her managed care company has contracts with or will negotiate with certain facilities, including the following:            Patient/family informed of bed offers received.  Patient chooses bed at       Physician recommends and patient chooses bed at      Patient to be transferred to   on  .  Patient to be transferred to facility by       Patient family notified on   of transfer.  Name of family member notified:        PHYSICIAN       Additional Comment:    _______________________________________________ Arlyss Repress, LCSW 07/16/2015, 3:35 PM

## 2015-07-16 NOTE — Progress Notes (Signed)
Patient ID: [name], female   DOB: 1926-12-08, 81 y.o.   MRN: [id number] I have reviewed her films thus far and she does have a left hip femoral neck fracture.  She will need a left hip hemiarthroplasty.  Will check on her later this am and meet her as well as review her notes.  She needs to be NPO after breakfast this am for possible surgery late this afternoon if she is medically/cardiac cleared for surgery.  Thanks.

## 2015-07-16 NOTE — Anesthesia Preprocedure Evaluation (Addendum)
Anesthesia Evaluation  Patient identified by MRN, date of birth, ID band Patient awake    Reviewed: Allergy & Precautions, H&P , NPO status , Patient's Chart, lab work & pertinent test results  Airway Mallampati: II  TM Distance: >3 FB Neck ROM: full    Dental no notable dental hx. (+) Dental Advisory Given   Pulmonary neg pulmonary ROS, former smoker,  breath sounds clear to auscultation  Pulmonary exam normal       Cardiovascular Exercise Tolerance: Good hypertension, Pt. on medications +CHF negative cardio ROS Normal cardiovascular examRhythm:regular Rate:Normal  AAA repair. EF 60% on echo   Neuro/Psych neuropathy negative neurological ROS  negative psych ROS   GI/Hepatic negative GI ROS, Neg liver ROS,   Endo/Other  negative endocrine ROS  Renal/GU negative Renal ROS  negative genitourinary   Musculoskeletal   Abdominal   Peds  Hematology Thrombocytopenia.  Platelets have dropped from 90K to 72 K since yesterday.   Anesthesia Other Findings   Reproductive/Obstetrics negative OB ROS                            Anesthesia Physical Anesthesia Plan  ASA: III  Anesthesia Plan: General   Post-op Pain Management:    Induction: Intravenous  Airway Management Planned: Oral ETT  Additional Equipment:   Intra-op Plan:   Post-operative Plan: Extubation in OR  Informed Consent: I have reviewed the patients History and Physical, chart, labs and discussed the procedure including the risks, benefits and alternatives for the proposed anesthesia with the patient or authorized representative who has indicated his/her understanding and acceptance.   Dental Advisory Given  Plan Discussed with: CRNA and Surgeon  Anesthesia Plan Comments:         Anesthesia Quick Evaluation

## 2015-07-16 NOTE — Progress Notes (Signed)
Chaplain received page concerning Advance Directives. Chaplain spoke with family about their concern; however daughter in law will check to see what documents are already in place and get back with [place]. Chaplain services are available at family request.   07/16/15 1300  Clinical Encounter Type  Visited With Patient and family together  Visit Type Initial;Spiritual support;Social support  Referral From Social work;Chaplain  Consult/Referral To [employer]

## 2015-07-16 NOTE — Progress Notes (Signed)
Report received from [facility] RN Agree with previous assessment and no acute changes noted at this time

## 2015-07-16 NOTE — Progress Notes (Signed)
CSW received consult for advance directives - spiritual care text paged. Awaiting cardiac clearance for surgery. Anticipating possible SNF placement - will need PT evaluation for insurance authorization after surgery.      Lincoln Maxin, LCSW [facility] Clinical Social Worker cell #: [phone]

## 2015-07-16 NOTE — Clinical Social Work Note (Signed)
Clinical Social Work Assessment  Patient Details  Name: [name] MRN: [id number] Date of Birth: 1927/10/13  Date of referral:  07/16/15               Reason for consult:  Facility Placement                Permission sought to share information with:  Oceanographer granted to share information::  Yes, Verbal Permission Granted  Name::        Agency::     Relationship::     Contact Information:     Housing/Transportation Living arrangements for the past 2 months:  Single Family Home Source of Information:  Patient, Adult Children Patient Interpreter Needed:  None Criminal Activity/Legal Involvement Pertinent to Current Situation/Hospitalization:  No - Comment as needed Significant Relationships:  Adult Children Lives with:  Self Do you feel safe going back to the place where you live?    Need for family participation in patient care:  Yes (Comment)  Care giving concerns:  CSW received consult for possible SNF placement at discharge.     Social Worker assessment / plan:  CSW spoke with patient & daughter-in-law at bedside re: discharge planning. Patient is tentatively scheduled for surgery tomorrow, Sat 8/20.   Employment status:  Retired Database administrator PT Recommendations:  Not assessed at this time Information / Referral to community resources:     Patient/Family's Response to care:  Patient & daughter-in-law informed CSW that she has always lived alone and has never been to a SNF before. Patient's daughter asked about patient going home with her & son and getting Home Care - CSW will check back to see what PT recommends after surgery.   Patient/Family's Understanding of and Emotional Response to Diagnosis, Current Treatment, and Prognosis:  Patient informed CSW that she was out watering the tomatoes when she tripped over the garden hose.   Emotional Assessment Appearance:  Appears stated  age Attitude/Demeanor/Rapport:    Affect (typically observed):  Calm, Quiet, Pleasant Orientation:  Oriented to Self, Oriented to Place, Oriented to  Time, Oriented to Situation Alcohol / Substance use:    Psych involvement (Current and /or in the community):     Discharge Needs  Concerns to be addressed:    Readmission within the last 30 days:    Current discharge risk:    Barriers to Discharge:      Arlyss Repress, LCSW 07/16/2015, 3:31 PM

## 2015-07-16 NOTE — Care Management Note (Signed)
Case Management Note  Patient Details  Name: Lamoni Alfredo Hollenkamp MRN: [id number] Date of Birth: 1927-10-03  Subjective/Objective:   81 y/o f admitted w/fall. L hip fx. Ortho following for sx in am.From home.Likely need SNF.                 Action/Plan:d/c plan SNF.   Expected Discharge Date:                  Expected Discharge Plan:  Skilled Nursing Facility  In-House Referral:  Clinical Social Work  Discharge planning Services  CM Consult  Post Acute Care Choice:    Choice offered to:     DME Arranged:    DME Agency:     HH Arranged:    HH Agency:     Status of Service:  In process, will continue to follow  Medicare Important Message Given:    Date Medicare IM Given:    Medicare IM give by:    Date Additional Medicare IM Given:    Additional Medicare Important Message give by:     If discussed at Long Length of Stay Meetings, dates discussed:    Additional Comments:  Lanier Clam, RN 07/16/2015, 2:09 PM

## 2015-07-17 ENCOUNTER — Encounter (HOSPITAL_COMMUNITY): Payer: Self-pay | Admitting: Certified Registered Nurse Anesthetist

## 2015-07-17 ENCOUNTER — Inpatient Hospital Stay (HOSPITAL_COMMUNITY): Payer: Medicare Other | Admitting: Anesthesiology

## 2015-07-17 ENCOUNTER — Inpatient Hospital Stay (HOSPITAL_COMMUNITY): Payer: Medicare Other

## 2015-07-17 ENCOUNTER — Encounter (HOSPITAL_COMMUNITY)
Admission: EM | Disposition: A | Discharge status: Skilled Nursing Facility | Payer: Self-pay | Source: Home / Self Care | Attending: Family Medicine

## 2015-07-17 LAB — CBC WITH DIFFERENTIAL/PLATELET
Basophils Absolute: 0 10*3/uL (ref 0.0–0.1)
Basophils Relative: 0 % (ref 0–1)
EOS PCT: 2 % (ref 0–5)
Eosinophils Absolute: 0.2 10*3/uL (ref 0.0–0.7)
HCT: 33.7 % — ABNORMAL LOW (ref 36.0–46.0)
Hemoglobin: 11 g/dL — ABNORMAL LOW (ref 12.0–15.0)
LYMPHS ABS: 1.5 10*3/uL (ref 0.7–4.0)
LYMPHS PCT: 16 % (ref 12–46)
MCH: 31.4 pg (ref 26.0–34.0)
MCHC: 32.6 g/dL (ref 30.0–36.0)
MCV: 96.3 fL (ref 78.0–100.0)
MONO ABS: 1 10*3/uL (ref 0.1–1.0)
Monocytes Relative: 10 % (ref 3–12)
Neutro Abs: 7.1 10*3/uL (ref 1.7–7.7)
Neutrophils Relative %: 73 % (ref 43–77)
PLATELETS: 72 10*3/uL — AB (ref 150–400)
RBC: 3.5 MIL/uL — AB (ref 3.87–5.11)
RDW: 14.5 % (ref 11.5–15.5)
WBC: 9.7 10*3/uL (ref 4.0–10.5)

## 2015-07-17 LAB — BASIC METABOLIC PANEL
Anion gap: 6 (ref 5–15)
BUN: 39 mg/dL — AB (ref 6–20)
CHLORIDE: 103 mmol/L (ref 101–111)
CO2: 26 mmol/L (ref 22–32)
Calcium: 8.4 mg/dL — ABNORMAL LOW (ref 8.9–10.3)
Creatinine, Ser: 1.51 mg/dL — ABNORMAL HIGH (ref 0.44–1.00)
GFR calc Af Amer: 35 mL/min — ABNORMAL LOW (ref >60)
GFR calc non Af Amer: 30 mL/min — ABNORMAL LOW (ref >60)
GLUCOSE: 106 mg/dL — AB (ref 65–99)
POTASSIUM: 3.6 mmol/L (ref 3.5–5.1)
Sodium: 135 mmol/L (ref 135–145)

## 2015-07-17 LAB — CREATININE, URINE, RANDOM: CREATININE, URINE: 201.86 mg/dL

## 2015-07-17 SURGERY — HEMIARTHROPLASTY, HIP, DIRECT ANTERIOR APPROACH, FOR FRACTURE
Anesthesia: General | Site: Hip | Laterality: Left

## 2015-07-17 MED ORDER — LIDOCAINE HCL (CARDIAC) 20 MG/ML IV SOLN
INTRAVENOUS | Status: DC | PRN
  Administered 2015-07-17: 50 mg via INTRAVENOUS

## 2015-07-17 MED ORDER — ONDANSETRON HCL 4 MG PO TABS
4.0000 mg | ORAL_TABLET | Freq: Four times a day (QID) | ORAL | Status: DC | PRN
Start: 2015-07-17 — End: 2015-07-20

## 2015-07-17 MED ORDER — FENTANYL CITRATE (PF) 100 MCG/2ML IJ SOLN: 25.0000 ug | INTRAMUSCULAR | Status: DC | PRN

## 2015-07-17 MED ORDER — ASPIRIN EC 325 MG PO TBEC
325.0000 mg | DELAYED_RELEASE_TABLET | Freq: Every day | ORAL | Status: DC
Start: 2015-07-18 — End: 2015-07-20
  Administered 2015-07-18 – 2015-07-20 (×3): 325 mg via ORAL
  Filled 2015-07-17 (×3): qty 1

## 2015-07-17 MED ORDER — HYDROCODONE-ACETAMINOPHEN 5-325 MG PO TABS
1.0000 | ORAL_TABLET | Freq: Four times a day (QID) | ORAL | Status: DC | PRN
  Administered 2015-07-17 – 2015-07-18 (×3): 1 via ORAL
  Filled 2015-07-17 (×3): qty 1

## 2015-07-17 MED ORDER — METOCLOPRAMIDE HCL 5 MG/ML IJ SOLN
INTRAMUSCULAR | Status: DC | PRN
  Administered 2015-07-17: 5 mg via INTRAVENOUS

## 2015-07-17 MED ORDER — FENTANYL CITRATE (PF) 100 MCG/2ML IJ SOLN
INTRAMUSCULAR | Status: DC | PRN
  Administered 2015-07-17: 50 ug via INTRAVENOUS

## 2015-07-17 MED ORDER — ROCURONIUM BROMIDE 100 MG/10ML IV SOLN
INTRAVENOUS | Status: DC | PRN
  Administered 2015-07-17: 30 mg via INTRAVENOUS

## 2015-07-17 MED ORDER — METOCLOPRAMIDE HCL 5 MG/ML IJ SOLN: 5.0000 mg | Freq: Three times a day (TID) | INTRAMUSCULAR | Status: DC | PRN

## 2015-07-17 MED ORDER — SODIUM CHLORIDE 0.9 % IV SOLN
INTRAVENOUS | Status: DC
  Administered 2015-07-17: 17:00:00 via INTRAVENOUS

## 2015-07-17 MED ORDER — METHOCARBAMOL 500 MG PO TABS: 500.0000 mg | ORAL_TABLET | Freq: Four times a day (QID) | ORAL | Status: DC | PRN

## 2015-07-17 MED ORDER — ACETAMINOPHEN 325 MG PO TABS: 650.0000 mg | ORAL_TABLET | Freq: Four times a day (QID) | ORAL | Status: DC | PRN

## 2015-07-17 MED ORDER — ONDANSETRON HCL 4 MG/2ML IJ SOLN: 4.0000 mg | Freq: Four times a day (QID) | INTRAMUSCULAR | Status: DC | PRN

## 2015-07-17 MED ORDER — CEFAZOLIN SODIUM-DEXTROSE 2-3 GM-% IV SOLR
INTRAVENOUS | Status: DC | PRN
  Administered 2015-07-17: 2 g via INTRAVENOUS

## 2015-07-17 MED ORDER — PHENYLEPHRINE HCL 10 MG/ML IJ SOLN
INTRAMUSCULAR | Status: DC | PRN
  Administered 2015-07-17 (×2): 120 ug via INTRAVENOUS
  Administered 2015-07-17: 80 ug via INTRAVENOUS
  Administered 2015-07-17: 120 ug via INTRAVENOUS

## 2015-07-17 MED ORDER — GLYCOPYRROLATE 0.2 MG/ML IJ SOLN
INTRAMUSCULAR | Status: AC
  Filled 2015-07-17: qty 3

## 2015-07-17 MED ORDER — METOCLOPRAMIDE HCL 10 MG PO TABS: 5.0000 mg | ORAL_TABLET | Freq: Three times a day (TID) | ORAL | Status: DC | PRN

## 2015-07-17 MED ORDER — FENTANYL CITRATE (PF) 250 MCG/5ML IJ SOLN
INTRAMUSCULAR | Status: AC
  Filled 2015-07-17: qty 25

## 2015-07-17 MED ORDER — MORPHINE SULFATE (PF) 2 MG/ML IV SOLN
0.5000 mg | INTRAVENOUS | Status: DC | PRN
  Administered 2015-07-18: 0.5 mg via INTRAVENOUS
  Filled 2015-07-17: qty 1

## 2015-07-17 MED ORDER — GLYCOPYRROLATE 0.2 MG/ML IJ SOLN
INTRAMUSCULAR | Status: DC | PRN
  Administered 2015-07-17: 0.6 mg via INTRAVENOUS

## 2015-07-17 MED ORDER — METHOCARBAMOL 1000 MG/10ML IJ SOLN
500.0000 mg | Freq: Four times a day (QID) | INTRAVENOUS | Status: DC | PRN
  Filled 2015-07-17: qty 5

## 2015-07-17 MED ORDER — NEOSTIGMINE METHYLSULFATE 10 MG/10ML IV SOLN
INTRAVENOUS | Status: AC
  Filled 2015-07-17: qty 1

## 2015-07-17 MED ORDER — CLINDAMYCIN PHOSPHATE 600 MG/50ML IV SOLN
600.0000 mg | Freq: Four times a day (QID) | INTRAVENOUS | Status: AC
  Administered 2015-07-17 – 2015-07-18 (×2): 600 mg via INTRAVENOUS
  Filled 2015-07-17 (×2): qty 50

## 2015-07-17 MED ORDER — NEOSTIGMINE METHYLSULFATE 10 MG/10ML IV SOLN
INTRAVENOUS | Status: DC | PRN
  Administered 2015-07-17: 4 mg via INTRAVENOUS

## 2015-07-17 MED ORDER — EPHEDRINE SULFATE 50 MG/ML IJ SOLN
INTRAMUSCULAR | Status: DC | PRN
  Administered 2015-07-17: 10 mg via INTRAVENOUS

## 2015-07-17 MED ORDER — ACETAMINOPHEN 650 MG RE SUPP: 650.0000 mg | Freq: Four times a day (QID) | RECTAL | Status: DC | PRN

## 2015-07-17 MED ORDER — CEFAZOLIN SODIUM-DEXTROSE 2-3 GM-% IV SOLR
INTRAVENOUS | Status: AC
  Filled 2015-07-17: qty 50

## 2015-07-17 MED ORDER — METOPROLOL TARTRATE 1 MG/ML IV SOLN
INTRAVENOUS | Status: DC | PRN
  Administered 2015-07-17 (×2): 1 mg via INTRAVENOUS

## 2015-07-17 MED ORDER — SUCCINYLCHOLINE CHLORIDE 20 MG/ML IJ SOLN
INTRAMUSCULAR | Status: DC | PRN
  Administered 2015-07-17: 80 mg via INTRAVENOUS

## 2015-07-17 MED ORDER — PROPOFOL 10 MG/ML IV BOLUS
INTRAVENOUS | Status: DC | PRN
  Administered 2015-07-17: 100 mg via INTRAVENOUS

## 2015-07-17 MED ORDER — SODIUM CHLORIDE 0.9 % IR SOLN
Status: DC | PRN
  Administered 2015-07-17: 1000 mL

## 2015-07-17 MED ORDER — LACTATED RINGERS IV SOLN
INTRAVENOUS | Status: DC
  Administered 2015-07-17 (×2): via INTRAVENOUS
  Administered 2015-07-17: 1 via INTRAVENOUS

## 2015-07-17 MED ORDER — PHENOL 1.4 % MT LIQD
1.0000 | OROMUCOSAL | Status: DC | PRN
  Administered 2015-07-17: 1 via OROMUCOSAL
  Filled 2015-07-17: qty 177

## 2015-07-17 MED ORDER — MENTHOL 3 MG MT LOZG
1.0000 | LOZENGE | OROMUCOSAL | Status: DC | PRN
  Filled 2015-07-17 (×2): qty 9

## 2015-07-17 SURGICAL SUPPLY — 41 items
BAG ZIPLOCK 12X15 (MISCELLANEOUS) IMPLANT
BENZOIN TINCTURE PRP APPL 2/3 (GAUZE/BANDAGES/DRESSINGS) IMPLANT
BLADE SAW SGTL 18X1.27X75 (BLADE) ×2 IMPLANT
CAPT HIP HEMI 2 ×2 IMPLANT
CELLS DAT CNTRL 66122 CELL SVR (MISCELLANEOUS) ×1 IMPLANT
COVER PERINEAL POST (MISCELLANEOUS) ×2 IMPLANT
DRAPE C-ARM 42X120 X-RAY (DRAPES) ×2 IMPLANT
DRAPE STERI IOBAN 125X83 (DRAPES) ×2 IMPLANT
DRAPE U-SHAPE 47X51 STRL (DRAPES) ×6 IMPLANT
DRSG AQUACEL AG ADV 3.5X10 (GAUZE/BANDAGES/DRESSINGS) ×2 IMPLANT
DURAPREP 26ML APPLICATOR (WOUND CARE) ×2 IMPLANT
ELECT BLADE TIP CTD 4 INCH (ELECTRODE) ×2 IMPLANT
ELECT REM PT RETURN 9FT ADLT (ELECTROSURGICAL) ×2
ELECTRODE REM PT RTRN 9FT ADLT (ELECTROSURGICAL) ×1 IMPLANT
FACESHIELD WRAPAROUND (MASK) ×8 IMPLANT
GAUZE XEROFORM 1X8 LF (GAUZE/BANDAGES/DRESSINGS) IMPLANT
GAUZE XEROFORM 5X9 LF (GAUZE/BANDAGES/DRESSINGS) ×2 IMPLANT
GLOVE BIO SURGEON STRL SZ7.5 (GLOVE) ×10 IMPLANT
GLOVE BIOGEL PI IND STRL 8 (GLOVE) ×2 IMPLANT
GLOVE BIOGEL PI INDICATOR 8 (GLOVE) ×2
GLOVE ECLIPSE 8.0 STRL XLNG CF (GLOVE) ×2 IMPLANT
GOWN STRL REUS W/TWL XL LVL3 (GOWN DISPOSABLE) ×8 IMPLANT
HANDPIECE INTERPULSE COAX TIP (DISPOSABLE) ×1
KIT BASIN OR (CUSTOM PROCEDURE TRAY) ×2 IMPLANT
PACK TOTAL JOINT (CUSTOM PROCEDURE TRAY) ×2 IMPLANT
PEN SKIN MARKING BROAD (MISCELLANEOUS) ×2 IMPLANT
RTRCTR WOUND ALEXIS 18CM MED (MISCELLANEOUS) ×2
SET HNDPC FAN SPRY TIP SCT (DISPOSABLE) ×1 IMPLANT
STAPLER VISISTAT 35W (STAPLE) IMPLANT
STRIP CLOSURE SKIN 1/2X4 (GAUZE/BANDAGES/DRESSINGS) IMPLANT
SUT ETHIBOND NAB CT1 #1 30IN (SUTURE) ×2 IMPLANT
SUT MNCRL AB 4-0 PS2 18 (SUTURE) IMPLANT
SUT VIC AB 0 CT1 36 (SUTURE) ×2 IMPLANT
SUT VIC AB 1 CT1 36 (SUTURE) ×2 IMPLANT
SUT VIC AB 2-0 CT1 27 (SUTURE) ×1
SUT VIC AB 2-0 CT1 TAPERPNT 27 (SUTURE) ×1 IMPLANT
TOWEL OR 17X26 10 PK STRL BLUE (TOWEL DISPOSABLE) ×2 IMPLANT
TOWEL OR NON WOVEN STRL DISP B (DISPOSABLE) ×2 IMPLANT
TRAY FOLEY W/METER SILVER 14FR (SET/KITS/TRAYS/PACK) ×2 IMPLANT
TRAY FOLEY W/METER SILVER 16FR (SET/KITS/TRAYS/PACK) IMPLANT
YANKAUER SUCT BULB TIP 10FT TU (MISCELLANEOUS) ×2 IMPLANT
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
## 2015-07-17 NOTE — Progress Notes (Signed)
Patient ID: [name], female   DOB: 1927/07/19, 81 y.o.   MRN: [id number] Will plan on surgery today to address her broken hip if she is cleared medically.  This will be a left hip hemiarthroplasty (partial hip).  Have discussed risks and benefits with the family.

## 2015-07-17 NOTE — Anesthesia Procedure Notes (Signed)
Procedure Name: Intubation Performed by: Jakobi Thetford J Pre-anesthesia Checklist: Patient identified, Emergency Drugs available, Suction available, Patient being monitored and Timeout performed Patient Re-evaluated:Patient Re-evaluated prior to inductionOxygen Delivery Method: Circle system utilized Preoxygenation: Pre-oxygenation with 100% oxygen Intubation Type: IV induction Ventilation: Mask ventilation without difficulty Laryngoscope Size: Mac and 4 Grade View: Grade I Tube type: Oral Tube size: 7.0 mm Number of attempts: 1 Airway Equipment and Method: Stylet Placement Confirmation: ETT inserted through vocal cords under direct vision,  positive ETCO2,  CO2 detector and breath sounds checked- equal and bilateral Secured at: 21 cm Tube secured with: Tape Dental Injury: Teeth and Oropharynx as per pre-operative assessment        

## 2015-07-17 NOTE — Brief Op Note (Signed)
07/15/2015 - 07/17/2015  1:30 PM  PATIENT:  [name]  81 y.o. female  PRE-OPERATIVE DIAGNOSIS:  Left hip femoral neck fracture  POST-OPERATIVE DIAGNOSIS:  left hip femoral neck fracture  PROCEDURE:  Procedure(s): ANTERIOR APPROACH HEMI HIP ARTHROPLASTY (Left)  SURGEON:  Surgeon(s) and Role:    * Kathryne Hitch, MD - Primary  PHYSICIAN ASSISTANT: Rexene Edison, PA-C  ANESTHESIA:   general  EBL:  Total I/O In: 1000 [I.V.:1000] Out: 350 [Blood:350]  BLOOD ADMINISTERED:none  DRAINS: none   LOCAL MEDICATIONS USED:  NONE  SPECIMEN:  No Specimen  DISPOSITION OF SPECIMEN:  N/A  COUNTS:  YES  TOURNIQUET:  * No tourniquets in log *  DICTATION: .Other Dictation: Dictation Number [id number]  PLAN OF CARE: Admit to inpatient   PATIENT DISPOSITION:  PACU - hemodynamically stable.   Delay start of Pharmacological VTE agent (>24hrs) due to surgical blood loss or risk of bleeding: no

## 2015-07-17 NOTE — Transfer of Care (Signed)
Immediate Anesthesia Transfer of Care Note  Patient: [name]  Procedure(s) Performed: Procedure(s): ANTERIOR APPROACH HEMI HIP ARTHROPLASTY (Left)  Patient Location: PACU  Anesthesia Type:General  Level of Consciousness: sedated, patient cooperative and responds to stimulation  Airway & Oxygen Therapy: Patient Spontanous Breathing and Patient connected to face mask oxygen  Post-op Assessment: Report given to RN and Post -op Vital signs reviewed and stable  Post vital signs: Reviewed and stable  Last Vitals:  Filed Vitals:   07/17/15 0852  BP: 101/40  Pulse: 78  Temp:   Resp:     Complications: No apparent anesthesia complications

## 2015-07-17 NOTE — Anesthesia Postprocedure Evaluation (Signed)
  Anesthesia Post-op Note  Patient: [name]  Procedure(s) Performed: Procedure(s) (LRB): ANTERIOR APPROACH HEMI HIP ARTHROPLASTY (Left)  Patient Location: PACU  Anesthesia Type: General  Level of Consciousness: awake and alert   Airway and Oxygen Therapy: Patient Spontanous Breathing  Post-op Pain: mild  Post-op Assessment: Post-op Vital signs reviewed, Patient's Cardiovascular Status Stable, Respiratory Function Stable, Patent Airway and No signs of Nausea or vomiting  Last Vitals:  Filed Vitals:   07/17/15 1451  BP: 96/40  Pulse: 72  Temp:   Resp: 15    Post-op Vital Signs: stable   Complications: No apparent anesthesia complications

## 2015-07-17 NOTE — Op Note (Signed)
[name], [name]                ACCOUNT NO.:  [id number]  MEDICAL RECORD NO.:  [id number]  LOCATION:  [facility]                         FACILITY:  [facility]  PHYSICIAN:  Vanita Panda. Magnus Ivan, M.D.DATE OF BIRTH:  [birth year]  DATE OF PROCEDURE:  07/17/2015 DATE OF DISCHARGE:                              OPERATIVE REPORT   PREOPERATIVE DIAGNOSIS:  Left hip displaced femoral neck fracture.  POSTOPERATIVE DIAGNOSIS:  Left hip displaced femoral neck fracture.  PROCEDURE:  Left hip bipolar hemiarthroplasty through direct anterior approach.  IMPLANTS:  DePuy bipolar head, with size 50 outer head and size 28 +15 inner head, size 13 Corail femoral component with standard offset.  SURGEON:  Vanita Panda. Magnus Ivan, M.D.  ASSISTING:  Richardean Canal, PA-C  ANESTHESIA:  General.  ANTIBIOTICS:  2 g IV Ancef.  BLOOD LOSS:  250 mL.  COMPLICATIONS:  None.  INDICATIONS:  [name] is an 81 year old female, who sustained a mechanical fall in her garden.  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  Her family is with her.  She was found to have a left hip displaced femoral neck fracture and it was recommended she undergo a left hip hemiarthroplasty for decreased pain, improved mobility, and overall improved quality of life.  They understand extensively the risk of acute blood loss anemia, nerve and vessel injury, fracture infection, dislocation, and perioperative death, given her advanced age.  She has been seen and cleared by the medical service and has felt she is at moderate risk. The family has talked to her extensively and she does not wish to be bedridden, understanding the risk and giving consent for surgery.  DESCRIPTION OF PROCEDURE:  After informed consent was obtained, appropriate left hip was marked.  She was brought to the operating room. General anesthesia was obtained while she was on the stretcher.  She already had a Foley  catheter in place.  Traction boots were placed on both of her feet.  Next, she was placed supine on the Hana fracture table with perineal post in place and both legs in inline skeletal traction devices, but no traction applied.  Her left hip was prepped and draped with DuraPrep and sterile drapes.  A time-out was called.  She was identified as correct patient and correct left hip.  We then made an incision inferior and posterior to the anterior superior iliac spine and carried this obliquely down the leg.  We dissected down to the tensor fascia lata muscle and the tensor fascia was then divided longitudinally, so we could proceed with a direct anterior approach to the hip.  We identified and cauterized the lateral femoral circumflex vessels and then put Cobra retractors around the lateral medial femoral neck outside the hip capsule.  We then opened up the hip capsule and did find a large hematoma.  This is a volar fracture we did find the fracture itself too.  We placed Cobra retractors within the hip capsule and then made our femoral neck cut proximal to the lesser trochanter using an oscillating saw and an osteotome.  We then placed a corkscrew guide in the femoral head and removed the femoral  head in its entirety and any debris from the acetabulum.  We measured this for size 58 hip ball.  Attention was then turned to the femur with the leg externally rotated to 100 degrees, extended and adducted.  We were able to release the lateral joint capsule and use a Mueller retractor medially and a Hohmann retractor behind the greater trochanter.  We used a box cutting osteotome to enter the femoral canal and a rongeur to lateralize and began broaching from a size 8 broach up to a size 13 and placed a trial of a standard neck, and then the bipolar 50 head with a 1.5 spacer, we reduced this in the acetabulum, where we placed with leg lengths and stability as well as offset and range of motion.  We  then dislocated the hip, removed the trial components.  We then placed the real Corail femoral component from DePuy size 13 with standard offset and the real 50 bipolar head with a +15 small head spacer.  We reduced the acetabulum, again we were pleased with stability.  We then irrigated the soft tissues with normal saline solution using pulsatile lavage.  We closed the joint capsule with #1 Ethibond suture followed by a running #1 Vicryl in the tensor fascia, 0 Vicryl in the deep tissue, 2-0 Vicryl in subcutaneous tissue, and staples on the skin.  Xeroform and Aquacel dressing were applied.  She was then taken off the Hana table, awakened, extubated taken to the recovery room in stable condition.  All final counts were correct.  There were no complications noted. Postoperatively, we will allow her to weight bear as tolerated with increasing activities as comfort allows.     Vanita Panda. Magnus Ivan, M.D.     CYB/MEDQ  D:  07/17/2015  T:  07/17/2015  Job:  [id number]

## 2015-07-18 LAB — CBC
HEMATOCRIT: 26.9 % — AB (ref 36.0–46.0)
HEMOGLOBIN: 9.3 g/dL — AB (ref 12.0–15.0)
MCH: 32.9 pg (ref 26.0–34.0)
MCHC: 34.6 g/dL (ref 30.0–36.0)
MCV: 95.1 fL (ref 78.0–100.0)
Platelets: 69 10*3/uL — ABNORMAL LOW (ref 150–400)
RBC: 2.83 MIL/uL — ABNORMAL LOW (ref 3.87–5.11)
RDW: 14.1 % (ref 11.5–15.5)
WBC: 7.6 10*3/uL (ref 4.0–10.5)

## 2015-07-18 LAB — BASIC METABOLIC PANEL
ANION GAP: 6 (ref 5–15)
BUN: 40 mg/dL — ABNORMAL HIGH (ref 6–20)
CALCIUM: 7.8 mg/dL — AB (ref 8.9–10.3)
CHLORIDE: 101 mmol/L (ref 101–111)
CO2: 25 mmol/L (ref 22–32)
CREATININE: 0.9 mg/dL (ref 0.44–1.00)
GFR calc non Af Amer: 56 mL/min — ABNORMAL LOW (ref >60)
GLUCOSE: 103 mg/dL — AB (ref 65–99)
Potassium: 3.7 mmol/L (ref 3.5–5.1)
Sodium: 132 mmol/L — ABNORMAL LOW (ref 135–145)

## 2015-07-18 LAB — UREA NITROGEN, URINE: UREA NITROGEN UR: 715 mg/dL

## 2015-07-18 MED ORDER — ASPIRIN 325 MG PO TBEC: 325.0000 mg | DELAYED_RELEASE_TABLET | Freq: Every day | ORAL | Status: DC

## 2015-07-18 MED ORDER — HYDROCODONE-ACETAMINOPHEN 5-325 MG PO TABS: 1.0000 | ORAL_TABLET | Freq: Four times a day (QID) | ORAL | Status: DC | PRN

## 2015-07-18 MED ORDER — POLYETHYLENE GLYCOL 3350 17 G PO PACK
17.0000 g | PACK | Freq: Every day | ORAL | Status: DC
  Administered 2015-07-18 – 2015-07-20 (×3): 17 g via ORAL
  Filled 2015-07-18 (×3): qty 1

## 2015-07-18 NOTE — Evaluation (Signed)
Occupational Therapy Evaluation Patient Details Name: [name] MRN: [id number] DOB: 11-02-1927 Today's Date: 07/18/2015    History of Present Illness 81 y.o. female with Past medical history of hypertension, GERD, neuropathy dyslipidemia, AAA admitted with mechanical fall sustained hip fx, s/p anterior approach hip hemiarthroplasty.    Clinical Impression   Pt was admitted for the above.  She was lethargic during evaluation and BP was low.  Will follow in acute with mod A +2 level goals for LB bathing and toilet transfers.  She lived alone prior to admission.  Pt will need follow up services at SNF    Follow Up Recommendations  SNF    Equipment Recommendations  3 in 1 bedside comode    Recommendations for Other Services       Precautions / Restrictions Precautions Precautions: Fall Precaution Comments: monitor BP and O2  Had DA hemiarthroplasty Restrictions Weight Bearing Restrictions: No Other Position/Activity Restrictions: WBAT, no hip precautions      Mobility Bed Mobility Overal bed mobility: +2 for physical assistance             General bed mobility comments: +2 total assist for supine to sit, pt 15%, assist to raise trunk and advance BLEs, difficult to communicate due to extreme HOH, orthostatic in sitting and total assist for sitting balance so deferred transfer.  BP supine 104/42, sitting 87/43.   Transfers                 General transfer comment: NT -orthostatic    Balance Overall balance assessment: Needs assistance Sitting-balance support: Bilateral upper extremity supported Sitting balance-Leahy Scale: Zero Sitting balance - Comments: Total A for sitting balance                                    ADL Overall ADL's : Needs assistance/impaired Eating/Feeding: Set up;Sitting (drinking)   Grooming: Maximal assistance;Bed level   Upper Body Bathing: Maximal assistance;Bed level   Lower Body Bathing: Total  assistance;+2 for physical assistance;Bed level   Upper Body Dressing : Maximal assistance;Bed level   Lower Body Dressing: Total assistance;+2 for physical assistance;Sit to/from stand                 General ADL Comments: pt lethargic and needed multiple cues; verbal and visual with repeated verbal cueing.  Total A x 2 to get to EOB and max A to maintain.  BP initially low.  Pt said "I'm not OK" now.  When asked if dizzy, she shook her head "no" and said "yes" to pain.  BP dropped further.  104/42 supine and 87/43 sitting.  Returned to supine.  Needs a lot of assistance with ADLs due to lethargy at this time.    Sats 93% on 2 liters.  Removed 02 briefly and 02 dropped to 85%.  Replaced 02 during session     Vision     Perception     Praxis      Pertinent Vitals/Pain Pain Assessment: Faces Faces Pain Scale: Hurts even more Pain Location: L hip Pain Descriptors / Indicators: Sore Pain Intervention(s): Limited activity within patient's tolerance;Monitored during session;Premedicated before session;Repositioned;Ice applied     Hand Dominance     Extremity/Trunk Assessment Upper Extremity Assessment Upper Extremity Assessment: Generalized weakness (grossly 3+/4- out of 5)          Communication Communication Communication: HOH (has hearing aids but not here.  R  ear seems better)   Cognition Arousal/Alertness: Suspect due to medications;Lethargic Behavior During Therapy: WFL for tasks assessed/performed Overall Cognitive Status: Difficult to assess                     General Comments       Exercises       Shoulder Instructions      Home Living Family/patient expects to be discharged to:: Skilled nursing facility Living Arrangements: Alone                           Home Equipment: Walker - 2 wheels;Cane - single point   Additional Comments: pt is extremely HOH and somewhat lethargic, difficult to obtain home info, stated she has hearing  aids at home, she was able to recall events that brought her to hospital      Prior Functioning/Environment Level of Independence: Independent with assistive device(s)        Comments: used walker or cane when "going to town"     OT Diagnosis: Generalized weakness;Acute pain   OT Problem List: Decreased strength;Decreased activity tolerance;Decreased knowledge of use of DME or AE;Decreased cognition;Impaired balance (sitting and/or standing);Pain;Cardiopulmonary status limiting activity   OT Treatment/Interventions: Self-care/ADL training;DME and/or AE instruction;Patient/family education;Balance training;Therapeutic activities;Cognitive remediation/compensation    OT Goals(Current goals can be found in the care plan section) Acute Rehab OT Goals Patient Stated Goal: none stated, communication limited by [facility] OT Goal Formulation: Patient unable to participate in goal setting (very HOH) Time For Goal Achievement: 07/25/15 Potential to Achieve Goals: Good ADL Goals Pt Will Perform Lower Body Bathing: with mod assist;with adaptive equipment;sit to/from stand (+2 assist) Pt Will Transfer to Toilet: with mod assist;stand pivot transfer;with +2 assist;bedside commode Additional ADL Goal #1: pt will complete UB adls with set up from supported sitting  OT Frequency: Min 2X/week   Barriers to D/C:            Co-evaluation PT/OT/SLP Co-Evaluation/Treatment: Yes Reason for Co-Treatment: Complexity of the patient's impairments (multi-system involvement);For patient/therapist safety PT goals addressed during session: Mobility/safety with mobility;Balance;Strengthening/ROM OT goals addressed during session: ADL's and self-care      End of Session Nurse Communication:  (BP)  Activity Tolerance: Treatment limited secondary to medical complications (Comment) Patient left: in bed;with call bell/phone within reach;with bed alarm set   Time: 0802-0821 OT Time Calculation (min): 19  min Charges:  OT General Charges $OT Visit: 1 Procedure OT Evaluation $Initial OT Evaluation Tier I: 1 Procedure G-Codes:    Natasa Stigall 08-05-2015, 9:39 AM   Marica Otter, OTR/L [phone] Aug 05, 2015

## 2015-07-18 NOTE — Discharge Instructions (Signed)
Full weight bearing as tolerated and up only with assistance. Can get current left hip dressing wet in the shower and should leave this dressing on until her ortho follow-up

## 2015-07-18 NOTE — Evaluation (Signed)
Physical Therapy Evaluation Patient Details Name: [name] MRN: [id number] DOB: 1927/06/02 Today's Date: 07/18/2015   History of Present Illness  81 y.o. female with Past medical history of hypertension, GERD, neuropathy dyslipidemia, AAA admitted with mechanical fall sustained hip fx, s/p anterior approach hip hemiarthroplasty.   Clinical Impression  Pt admitted with above diagnosis. Pt currently with functional limitations due to the deficits listed below (see PT Problem List). +2 total assist for supine to sit. Communication limited as pt is extremely HOH. She was hypoxic on RA with SaO2 of 85%, 93% on 2L O2. BP supine 104/42, sitting 87/43. Did not attempt transfers due to orthostatic BP. Pt lives alone and requires total assist for mobility at present, SNF recommended.  Pt will benefit from skilled PT to increase their independence and safety with mobility to allow discharge to the venue listed below.       Follow Up Recommendations SNF    Equipment Recommendations  None recommended by PT    Recommendations for Other Services OT consult     Precautions / Restrictions Precautions Precautions: Fall Precaution Comments: monitor BP and O2 Restrictions Weight Bearing Restrictions: No Other Position/Activity Restrictions: WBAT, no hip precautions      Mobility  Bed Mobility Overal bed mobility: +2 for physical assistance             General bed mobility comments: +2 total assist for supine to sit, pt 15%, assist to raise trunk and advance BLEs, difficult to communicate due to extreme HOH, orthostatic in sitting and total assist for sitting balance so deferred transfer. BP supine 104/42, sitting 87/43.   Transfers                 General transfer comment: NT -orthostatic  Ambulation/Gait                Stairs            Wheelchair Mobility    Modified Rankin (Stroke Patients Only)       Balance Overall balance assessment: Needs  assistance Sitting-balance support: Bilateral upper extremity supported;Feet supported Sitting balance-Leahy Scale: Zero Sitting balance - Comments: Total assist for sitting balance, pt 15%, BP 87/43 sitting, 104/42 supine, pt denied dizziness                                     Pertinent Vitals/Pain Pain Assessment: Faces Faces Pain Scale: Hurts even more Pain Location: L hip with activity Pain Descriptors / Indicators: Sore Pain Intervention(s): Ice applied;Monitored during session;Premedicated before session;Limited activity within patient's tolerance    Home Living Family/patient expects to be discharged to:: Skilled nursing facility Living Arrangements: Alone             Home Equipment: Dan Humphreys - 2 wheels;Cane - single point Additional Comments: pt is extremely HOH and somewhat lethargic, difficult to obtain home info, stated she has hearing aids at home, she was able to recall events that brought her to hospital    Prior Function Level of Independence: Independent with assistive device(s)         Comments: used walker or cane when "going to town"      Higher education careers adviser        Extremity/Trunk Assessment   Upper Extremity Assessment: Defer to OT evaluation           Lower Extremity Assessment: LLE deficits/detail   LLE Deficits / Details: tolerated  AAROM L hip flexion and ABDuction with out grimmacing or verbalizing increased pain, difficult to assess due to [facility]     Communication   Communication: HOH  Cognition Arousal/Alertness: Suspect due to medications;Lethargic Behavior During Therapy: Flat affect;WFL for tasks assessed/performed Overall Cognitive Status: Difficult to assess (pt able to recall fall, stated name and birthdate correctly)                      General Comments      Exercises Total Joint Exercises Heel Slides: AAROM;Left;10 reps;Supine Hip ABduction/ADduction: AAROM;Left;10 reps;Supine      Assessment/Plan     PT Assessment Patient needs continued PT services  PT Diagnosis Difficulty walking;Generalized weakness;Acute pain   PT Problem List Decreased strength;Decreased range of motion;Decreased activity tolerance;Decreased balance;Pain;Decreased knowledge of use of DME;Decreased mobility  PT Treatment Interventions DME instruction;Gait training;Functional mobility training;Therapeutic activities;Patient/family education;Therapeutic exercise   PT Goals (Current goals can be found in the Care Plan section) Acute Rehab PT Goals Patient Stated Goal: none stated, communication limited by [facility] PT Goal Formulation: Patient unable to participate in goal setting Time For Goal Achievement: 08/01/15 Potential to Achieve Goals: Good    Frequency Min 3X/week   Barriers to discharge Decreased caregiver support per chart pt lives alone    Co-evaluation PT/OT/SLP Co-Evaluation/Treatment: Yes Reason for Co-Treatment: Complexity of the patient's impairments (multi-system involvement);For patient/therapist safety PT goals addressed during session: Mobility/safety with mobility;Balance;Strengthening/ROM         End of Session   Activity Tolerance: Treatment limited secondary to medical complications (Comment) (orthostatic, lethargic) Patient left: in bed;with call bell/phone within reach;with bed alarm set Nurse Communication: Mobility status;Need for lift equipment         Time: [phone] PT Time Calculation (min) (ACUTE ONLY): 28 min   Charges:   PT Evaluation $Initial PT Evaluation Tier I: 1 Procedure     PT G Codes:        Tamala Ser 07/18/2015, 8:38 AM [phone]

## 2015-07-18 NOTE — Plan of Care (Signed)
Problem: Phase I Progression Outcomes Goal: OOB as tolerated unless otherwise ordered Outcome: Not Progressing PT unable to get pt OOB due to being orthostatic, turned and repositioned q 2 hrs
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
## 2015-07-18 NOTE — Progress Notes (Signed)
Subjective: 1 Day Post-Op Procedure(s) (LRB): ANTERIOR APPROACH HEMI HIP ARTHROPLASTY (Left) Patient reports pain as moderate.  Tolerated surgery well.  H/H decreased from fracture and surgery, but vital stable from this acute blood loss anemia.  Objective: Vital signs in last 24 hours: Temp:  [97.4 F (36.3 C)-99.6 F (37.6 C)] 99.6 F (37.6 C) (08/21 0437) Pulse Rate:  [66-96] 96 (08/21 0437) Resp:  [11-20] 20 (08/21 0437) BP: (87-117)/(35-65) 103/37 mmHg (08/21 0437) SpO2:  [91 %-98 %] 96 % (08/21 0437) Weight:  [74.5 kg (164 lb 3.9 oz)] 74.5 kg (164 lb 3.9 oz) (08/21 0605)  Intake/Output from previous day: 08/20 0701 - 08/21 0700 In: 3092.7 [P.O.:960; I.V.:2132.7] Out: 975 [Urine:625; Blood:350] Intake/Output this shift:     Recent Labs  07/15/15 2230 07/16/15 0516 07/17/15 0553 07/18/15 0639  HGB 12.6 12.6 11.0* 9.3*    Recent Labs  07/17/15 0553 07/18/15 0639  WBC 9.7 7.6  RBC 3.50* 2.83*  HCT 33.7* 26.9*  PLT 72* 69*    Recent Labs  07/17/15 0553 07/18/15 0639  NA 135 132*  K 3.6 3.7  CL 103 101  CO2 26 25  BUN 39* 40*  CREATININE 1.51* 0.90  GLUCOSE 106* 103*  CALCIUM 8.4* 7.8*    Recent Labs  07/15/15 2230 07/16/15 1126  INR 1.26 1.37    Intact pulses distally Dorsiflexion/Plantar flexion intact Incision: scant drainage  Assessment/Plan: 1 Day Post-Op Procedure(s) (LRB): ANTERIOR APPROACH HEMI HIP ARTHROPLASTY (Left) Up with therapy - WBAT left hip, no precautions Aspirin for DVT coverage only given fall risk. Will likely need SNF placement.  Evadna Donaghy Y 07/18/2015, 8:02 AM

## 2015-07-19 DIAGNOSIS — S72009A Fracture of unspecified part of neck of unspecified femur, initial encounter for closed fracture: Secondary | ICD-10-CM

## 2015-07-19 DIAGNOSIS — I359 Nonrheumatic aortic valve disorder, unspecified: Secondary | ICD-10-CM

## 2015-07-19 DIAGNOSIS — I509 Heart failure, unspecified: Secondary | ICD-10-CM

## 2015-07-19 DIAGNOSIS — G629 Polyneuropathy, unspecified: Secondary | ICD-10-CM

## 2015-07-19 DIAGNOSIS — I10 Essential (primary) hypertension: Secondary | ICD-10-CM

## 2015-07-19 DIAGNOSIS — I714 Abdominal aortic aneurysm, without rupture: Secondary | ICD-10-CM

## 2015-07-19 LAB — URINALYSIS, ROUTINE W REFLEX MICROSCOPIC
BILIRUBIN URINE: NEGATIVE
GLUCOSE, UA: NEGATIVE mg/dL
HGB URINE DIPSTICK: NEGATIVE
Ketones, ur: NEGATIVE mg/dL
Leukocytes, UA: NEGATIVE
Nitrite: NEGATIVE
Protein, ur: NEGATIVE mg/dL
SPECIFIC GRAVITY, URINE: 1.012 (ref 1.005–1.030)
UROBILINOGEN UA: 2 mg/dL — AB (ref 0.0–1.0)
pH: 6.5 (ref 5.0–8.0)

## 2015-07-19 LAB — CBC
HCT: 28.5 % — ABNORMAL LOW (ref 36.0–46.0)
HEMOGLOBIN: 10 g/dL — AB (ref 12.0–15.0)
MCH: 33.2 pg (ref 26.0–34.0)
MCHC: 35.1 g/dL (ref 30.0–36.0)
MCV: 94.7 fL (ref 78.0–100.0)
Platelets: 83 10*3/uL — ABNORMAL LOW (ref 150–400)
RBC: 3.01 MIL/uL — AB (ref 3.87–5.11)
RDW: 13.9 % (ref 11.5–15.5)
WBC: 8.9 10*3/uL (ref 4.0–10.5)

## 2015-07-19 LAB — BASIC METABOLIC PANEL
Anion gap: 3 — ABNORMAL LOW (ref 5–15)
BUN: 34 mg/dL — AB (ref 6–20)
CHLORIDE: 100 mmol/L — AB (ref 101–111)
CO2: 29 mmol/L (ref 22–32)
CREATININE: 0.81 mg/dL (ref 0.44–1.00)
Calcium: 8 mg/dL — ABNORMAL LOW (ref 8.9–10.3)
GFR calc Af Amer: >60 mL/min (ref >60)
GFR calc non Af Amer: >60 mL/min (ref >60)
GLUCOSE: 105 mg/dL — AB (ref 65–99)
POTASSIUM: 3.5 mmol/L (ref 3.5–5.1)
SODIUM: 132 mmol/L — AB (ref 135–145)

## 2015-07-19 NOTE — Progress Notes (Signed)
Pt with no urinary output, bladder scan showed , MD notified and order obtained for insertion of F/C, inserted using sterile technique and assisted by CNA, dark yellow urine noted

## 2015-07-19 NOTE — Progress Notes (Addendum)
[facility]                                                                              Patient Demographics  [name], is a 81 y.o. female, DOB - 08/01/1927, ZOX:[id number]  Admit date - 07/15/2015   Admitting Physician Rolly Salter, MD  Outpatient Primary MD for the patient is Cassell Smiles., MD  LOS - 3   Chief Complaint  Patient presents with  . Fall       Brief HPI   Patient is a 81 year old female with hypertension, GERD, neuropathy, dyslipidemia, AAA, presented on 8/18 with mechanical fall. Patient had reported that she was watering her tomatoes in the garden and suddenly stepped over her waterhose and fell down. Patient was found to have left hip fracture. Orthopedics was consulted. Patient underwent left hemi-hip arthroplasty on 8/20.    Assessment & Plan    Principal Problem:  Left Hip fracture: Postop day #2 - Currently stable, continue aspirin for DVT prophylaxis - H&H stable - Continue physical therapy, hopefully dc to skilled nursing facility placement in a.m. - WBAT left hip  Active Problems: Acute on chronic hypoxic respiratory failure- improved, possibly due to mild acute on chronic diastolic CHF - Currently stable, 2-D echo 8/19 showed EF of 60%, no wall motion abnormalities - Patient received Lasix IV on 8/19.  Acute blood loss anemia postoperative - Currently H&H stable  Acute urinary retention:  - Bladder scan showed 850 mL, place Foley catheter, obtain UA and culture to rule out any UTI  Mild acute kidney injury: Possibly due to diuresis, received Lasix on 8/19 - Now resolved  Essential hypertension - Currently stable, holding Diovan/HCTZ  Hyperlipidemia  - Continue Pravachol    Code Status:Full CODE STATUS   Family Communication: Discussed in detail with the patient, all imaging results, lab results explained to the patient    Disposition Plan: Hopefully DC to skilled nursing facility in a.m.   Time Spent  in minutes 25 minutes  Procedures  Left hemi-arthroplasty on 8/20   Consults   Orthopedics   DVT Prophylaxis heparin subcutaneous   Medications  Scheduled Meds: . aspirin EC  325 mg Oral Q breakfast  . gabapentin  100 mg Oral BID  . gabapentin  300 mg Oral QHS  . heparin  5,000 Units Subcutaneous 3 times per day  . polyethylene glycol  17 g Oral Daily  . pravastatin  40 mg Oral QHS   Continuous Infusions:  PRN Meds:.acetaminophen **OR** acetaminophen, menthol-cetylpyridinium **OR** phenol, methocarbamol **OR** methocarbamol (ROBAXIN)  IV, methocarbamol **OR** [DISCONTINUED] methocarbamol (ROBAXIN)  IV, metoCLOPramide **OR** metoCLOPramide (REGLAN) injection, ondansetron **OR** ondansetron (ZOFRAN) IV, oxyCODONE, traMADol   Antibiotics   Anti-infectives    Start     Dose/Rate Route Frequency Ordered Stop   07/17/15 1800  clindamycin (CLEOCIN) IVPB 600 mg     600 mg 100 mL/hr over 30 Minutes Intravenous Every 6 hours 07/17/15 1631 07/18/15 0031        Subjective:   [name] was seen and examined today. Patient denies dizziness, chest pain, shortness of breath, abdominal pain, N/V/D/C, new weakness, numbess, tingling. No  acute events overnight.    Objective:   Blood pressure 135/52, pulse 87, temperature 98.7 F (37.1 C), temperature source Oral, resp. rate 18, height 5\' 9"  (1.753 m), weight 75.3 kg (166 lb 0.1 oz), SpO2 96 %.  Wt Readings from Last 3 Encounters:  07/19/15 75.3 kg (166 lb 0.1 oz)  04/02/15 74.844 kg (165 lb)  06/17/12 81.874 kg (180 lb 8 oz)     Intake/Output Summary (Last 24 hours) at 07/19/15 1000 Last data filed at 07/19/15 0500  Gross per 24 hour  Intake   1497 ml  Output    675 ml  Net    822 ml    Exam  General: Alert and oriented x 3, NAD  HEENT:  PERRLA, EOMI, Anicteric Sclera, mucous membranes moist.   Neck: Supple, no JVD, no masses  CVS: S1 S2 auscultated, no rubs, murmurs or gallops. Regular rate and  rhythm.  Respiratory: Clear to auscultation bilaterally, no wheezing, rales or rhonchi  Abdomen: Soft, nontender, nondistended, + bowel sounds  Ext: no cyanosis clubbing or edema  Neuro: AAOx3, Cr N's II- XII.  Skin: No rashes  Psych: Normal affect and demeanor, alert and oriented x3    Data Review   Micro Results Recent Results (from the past 240 hour(s))  MRSA PCR Screening     Status: None   Collection Time: 07/16/15  6:05 AM  Result Value Ref Range Status   MRSA by PCR NEGATIVE NEGATIVE Final    Comment:        The GeneXpert MRSA Assay (FDA approved for NASAL specimens only), is one component of a comprehensive MRSA colonization surveillance program. It is not intended to diagnose MRSA infection nor to guide or monitor treatment for MRSA infections.     Radiology Reports Ct Head Wo Contrast  07/16/2015   CLINICAL DATA:  Fall with hip fracture  EXAM: CT HEAD WITHOUT CONTRAST  CT CERVICAL SPINE WITHOUT CONTRAST  TECHNIQUE: Multidetector CT imaging of the head and cervical spine was performed following the standard protocol without intravenous contrast. Multiplanar CT image reconstructions of the cervical spine were also generated.  COMPARISON:  05/27/2010 head CT  FINDINGS: CT HEAD FINDINGS  Skull and Sinuses:Negative for fracture or destructive process. The mastoids, middle ears, and imaged paranasal sinuses are clear.  Orbits: No acute abnormality.  Brain: No evidence of acute infarction, hemorrhage, hydrocephalus, or mass lesion/mass effect. Cerebral volume is within normal limits for age. Probable remote lacunar infarct in the left caudate head.  CT CERVICAL SPINE FINDINGS  Negative for acute fracture or subluxation. No prevertebral edema. No gross cervical canal hematoma. No significant osseous canal or foraminal stenosis.  Biapical emphysema.  IMPRESSION: No evidence of intracranial or cervical spine injury.   Electronically Signed   By: Marnee Spring M.D.   On:  07/16/2015 04:59   Ct Cervical Spine Wo Contrast  07/16/2015   CLINICAL DATA:  Fall with hip fracture  EXAM: CT HEAD WITHOUT CONTRAST  CT CERVICAL SPINE WITHOUT CONTRAST  TECHNIQUE: Multidetector CT imaging of the head and cervical spine was performed following the standard protocol without intravenous contrast. Multiplanar CT image reconstructions of the cervical spine were also generated.  COMPARISON:  05/27/2010 head CT  FINDINGS: CT HEAD FINDINGS  Skull and Sinuses:Negative for fracture or destructive process. The mastoids, middle ears, and imaged paranasal sinuses are clear.  Orbits: No acute abnormality.  Brain: No evidence of acute infarction, hemorrhage, hydrocephalus, or mass lesion/mass effect. Cerebral volume is within normal  limits for age. Probable remote lacunar infarct in the left caudate head.  CT CERVICAL SPINE FINDINGS  Negative for acute fracture or subluxation. No prevertebral edema. No gross cervical canal hematoma. No significant osseous canal or foraminal stenosis.  Biapical emphysema.  IMPRESSION: No evidence of intracranial or cervical spine injury.   Electronically Signed   By: Marnee Spring M.D.   On: 07/16/2015 04:59   Dg Pelvis Portable  07/17/2015   CLINICAL DATA:  Patient status post left hip hemiarthroplasty.  EXAM: PORTABLE PELVIS 1-2 VIEWS  COMPARISON:  None.  FINDINGS: Postoperative changes compatible with left hip arthroplasty. Overlying soft tissue gas. Surgical staple line. Hardware appears intact. No acute osseous abnormality.  IMPRESSION: Postoperative changes compatible with left hip arthroplasty.   Electronically Signed   By: Annia Belt M.D.   On: 07/17/2015 14:16   US Aorta  07/16/2015   CLINICAL DATA:  Abdominal aortic aneurysm  EXAM: ULTRASOUND OF ABDOMINAL AORTA  TECHNIQUE: Ultrasound examination of the abdominal aorta was performed to evaluate for abdominal aortic aneurysm.  COMPARISON:  None.  FINDINGS: Known abdominal aortic aneurysm which is infrarenal.  Maximal dimensions are 32 mm AP and 35 mm transverse. This is only 1 mm larger than in 2013 by CT. The bilateral common iliac arteries have normal diameter of 13 and 15 mm. The proximal most aorta is obscured by bowel gas, but this area was non aneurysmal on abdominal CT in 2013. No gross indication of aortic dissection.  IMPRESSION: 35 mm infrarenal aortic aneurysm, 1 mm larger than in 2013.   Electronically Signed   By: Marnee Spring M.D.   On: 07/16/2015 05:08   Dg Chest Port 1 View  07/15/2015   CLINICAL DATA:  Larey Seat.  Left femoral neck fracture.  EXAM: PORTABLE CHEST - 1 VIEW  COMPARISON:  04/02/2015.  FINDINGS: Interval mild cardiomegaly and prominent pulmonary vasculature. Stable mild prominence of the interstitial markings. No fracture or pneumothorax seen. Cervical spine degenerative changes.  IMPRESSION: Interval mild cardiomegaly and pulmonary vascular congestion with stable underlying chronic interstitial lung disease.   Electronically Signed   By: Beckie Salts M.D.   On: 07/15/2015 22:17   Dg C-arm 1-60 Min-no Report  07/17/2015   CLINICAL DATA: fractured left hip   C-ARM 1-60 MINUTES  Fluoroscopy was utilized by the requesting physician.  No radiographic  interpretation.    Dg Hip Unilat With Pelvis 2-3 Views Left  07/17/2015   CLINICAL DATA:  Left hip replacement  EXAM: DG C-ARM 1-60 MIN - NRPT [facility]; DG HIP (WITH OR WITHOUT PELVIS) 2-3V LEFT  COMPARISON:  07/15/2015  FLUOROSCOPY TIME:  Radiation Exposure Index (as provided by the fluoroscopic device): Not available  If the device does not provide the exposure index:  Fluoroscopy Time:  31 seconds  Number of Acquired Images:  2  FINDINGS: A left hip replacement is now seen. No acute soft tissue abnormality is noted.  IMPRESSION: Status post left hip replacement   Electronically Signed   By: Alcide Clever M.D.   On: 07/17/2015 13:45   Dg Hip Unilat With Pelvis 2-3 Views Left  07/15/2015   CLINICAL DATA:  Left hip pain and leg rotation  following a fall in her yard today.  EXAM: DG HIP (WITH OR WITHOUT PELVIS) 2-3V LEFT  COMPARISON:  None.  FINDINGS: Left femoral neck fracture with proximal displacement of the distal fragment and varus angulation. Lower lumbar spine degenerative changes.  IMPRESSION: Left femoral neck fracture, as described above.  Electronically Signed   By: Beckie Salts M.D.   On: 07/15/2015 22:06    CBC  Recent Labs Lab 07/15/15 2230 07/16/15 0516 07/17/15 0553 07/18/15 0639 07/19/15 0518  WBC 9.8 14.2* 9.7 7.6 8.9  HGB 12.6 12.6 11.0* 9.3* 10.0*  HCT 36.9 37.3 33.7* 26.9* 28.5*  PLT 93* 91* 72* 69* 83*  MCV 94.9 95.6 96.3 95.1 94.7  MCH 32.4 32.3 31.4 32.9 33.2  MCHC 34.1 33.8 32.6 34.6 35.1  RDW 13.8 13.9 14.5 14.1 13.9  LYMPHSABS 0.5* 0.9 1.5  --   --   MONOABS 0.7 0.9 1.0  --   --   EOSABS 0.0 0.0 0.2  --   --   BASOSABS 0.0 0.0 0.0  --   --     Chemistries   Recent Labs Lab 07/15/15 2230 07/16/15 0516 07/17/15 0553 07/18/15 0639 07/19/15 0518  NA 137 137 135 132* 132*  K 3.4* 3.8 3.6 3.7 3.5  CL 107 104 103 101 100*  CO2 24 27 26 25 29   GLUCOSE 122* 123* 106* 103* 105*  BUN 21* 24* 39* 40* 34*  CREATININE 0.83 1.04* 1.51* 0.90 0.81  CALCIUM 9.4 9.3 8.4* 7.8* 8.0*  AST  --  55*  --   --   --   ALT  --  28  --   --   --   ALKPHOS  --  77  --   --   --   BILITOT  --  2.9*  --   --   --    ------------------------------------------------------------------------------------------------------------------ estimated creatinine clearance is 52.1 mL/min (by C-G formula based on Cr of 0.81). ------------------------------------------------------------------------------------------------------------------ No results for input(s): HGBA1C in the last 72 hours. ------------------------------------------------------------------------------------------------------------------ No results for input(s): CHOL, HDL, LDLCALC, TRIG, CHOLHDL, LDLDIRECT in the last 72  hours. ------------------------------------------------------------------------------------------------------------------ No results for input(s): TSH, T4TOTAL, T3FREE, THYROIDAB in the last 72 hours.  Invalid input(s): FREET3 ------------------------------------------------------------------------------------------------------------------ No results for input(s): VITAMINB12, FOLATE, FERRITIN, TIBC, IRON, RETICCTPCT in the last 72 hours.  Coagulation profile  Recent Labs Lab 07/15/15 2230 07/16/15 1126  INR 1.26 1.37    No results for input(s): DDIMER in the last 72 hours.  Cardiac Enzymes  Recent Labs Lab 07/16/15 0516 07/16/15 1126 07/16/15 1715  TROPONINI 0.04* 0.05* 0.04*   ------------------------------------------------------------------------------------------------------------------ Invalid input(s): POCBNP  No results for input(s): GLUCAP in the last 72 hours.   Mycah Mcdougall M.D. [facility] 07/19/2015, 10:00 AM  Pager: [phone] Between 7am to 7pm - call Pager - [phone]  After 7pm go to [email or web address] - password [email or web address]  Call night coverage person covering after 7pm

## 2015-07-19 NOTE — Plan of Care (Signed)
Problem: Phase I Progression Outcomes Goal: Voiding-avoid urinary catheter unless indicated Outcome: Not Met (add Reason) Unable to void on own since f/c removed, reinserted catheter this AM

## 2015-07-19 NOTE — Progress Notes (Signed)
Physical Therapy Treatment Patient Details Name: [name] MRN: [id number] DOB: November 22, 1927 Today's Date: 2015-08-14    History of Present Illness 81 y.o. female with Past medical history of hypertension, GERD, neuropathy dyslipidemia, AAA admitted with mechanical fall sustained hip fx, s/p anterior approach hip hemiarthroplasty.     PT Comments    Pt assisted OOB over to recliner today and required mulitmodal cues for technique and safety.  Continue to recommend SNF.  Follow Up Recommendations  SNF     Equipment Recommendations  None recommended by PT    Recommendations for Other Services       Precautions / Restrictions Precautions Precautions: Fall Precaution Comments: direct anterior approach, monitor BP and O2 Restrictions Other Position/Activity Restrictions: WBAT    Mobility  Bed Mobility Overal bed mobility: Needs Assistance;+2 for physical assistance Bed Mobility: Supine to Sit     Supine to sit: +2 for physical assistance;Total assist     General bed mobility comments: pt given time and encouragement to assist however decreased ability ?weakness/HOH/pain? howeve utlitzed bed pad to assist with transfer and positioning EOB  Transfers Overall transfer level: Needs assistance Equipment used: Rolling walker (2 wheeled) Transfers: Sit to/from Stand Sit to Stand: Mod assist;+2 physical assistance;From elevated surface         General transfer comment: verbal cues for technique, assist to rise and steady, multimodal cues for stand pivot to recliner  Ambulation/Gait                 Stairs            Wheelchair Mobility    Modified Rankin (Stroke Patients Only)       Balance                                    Cognition Arousal/Alertness: Awake/alert Behavior During Therapy: WFL for tasks assessed/performed Overall Cognitive Status: No family/caregiver present to determine baseline cognitive functioning (overall  appropriate, however multimodal cues required)                      Exercises      General Comments        Pertinent Vitals/Pain Pain Assessment: Faces Faces Pain Scale: Hurts little more Pain Location: L hip Pain Descriptors / Indicators: Sore Pain Intervention(s): Limited activity within patient's tolerance;Repositioned;Monitored during session    Home Living                      Prior Function            PT Goals (current goals can now be found in the care plan section) Progress towards PT goals: Progressing toward goals    Frequency  Min 3X/week    PT Plan Current plan remains appropriate    Co-evaluation             End of Session Equipment Utilized During Treatment: Gait belt;Oxygen Activity Tolerance: Patient tolerated treatment well Patient left: in chair;with call bell/phone within reach     Time: 1123-1135 PT Time Calculation (min) (ACUTE ONLY): 12 min  Charges:  $Therapeutic Activity: 8-22 mins                    G Codes:      Lene Mckay,KATHrine E 08/14/2015, 1:03 PM Zenovia Jarred, PT, DPT 08-14-15 Pager: [phone]

## 2015-07-19 NOTE — Care Management Note (Signed)
Case Management Note  Patient Details  Name: [name] MRN: [id number] Date of Birth: [birth year]  Subjective/Objective:    Bladder scanned,ua, urine cx, f/c.POD#2 L THA.PT-SNF.                Action/Plan:d/c SNF   Expected Discharge Date:                  Expected Discharge Plan:  Skilled Nursing Facility  In-House Referral:  Clinical Social Work  Discharge planning Services  CM Consult  Post Acute Care Choice:    Choice offered to:     DME Arranged:    DME Agency:     HH Arranged:    HH Agency:     Status of Service:  In process, will continue to follow  Medicare Important Message Given:    Date Medicare IM Given:    Medicare IM give by:    Date Additional Medicare IM Given:    Additional Medicare Important Message give by:     If discussed at Long Length of Stay Meetings, dates discussed:    Additional Comments:  Lanier Clam, RN 07/19/2015, 10:39 AM

## 2015-07-19 NOTE — Progress Notes (Signed)
I and O cath returned 350 cc's clear dark amber urine.  Pt tolerated it well.  Has drank 240 more cc's.

## 2015-07-19 NOTE — Clinical Social Work Placement (Signed)
CSW spoke with patient's son, [name] (cell#: [phone]) at bedside & provided SNF bed offers - son accepted bed at [facility]. CSW confirmed with Keri & Tami at [facility] that they would have a semi-private room available tomorrow.     Lincoln Maxin, LCSW [facility] Clinical Social Worker cell #: [phone]    CLINICAL SOCIAL WORK PLACEMENT  NOTE  Date:  07/19/2015  Patient Details  Name: [name] MRN: [id number] Date of Birth: 08/13/1927  Clinical Social Work is seeking post-discharge placement for this patient at the Skilled  Nursing Facility level of care (*CSW will initial, date and re-position this form in  chart as items are completed):  Yes   Patient/family provided with [facility] Clinical Social Work Department's list of facilities offering this level of care within the geographic area requested by the patient (or if unable, by the patient's family).  Yes   Patient/family informed of their freedom to choose among providers that offer the needed level of care, that participate in Medicare, Medicaid or managed care program needed by the patient, have an available bed and are willing to accept the patient.  Yes   Patient/family informed of [facility]'s ownership interest in [facility] and [facility], as well as of the fact that they are under no obligation to receive care at these facilities.  PASRR submitted to EDS on 07/16/15     PASRR number received on 07/16/15     Existing PASRR number confirmed on       FL2 transmitted to all facilities in geographic area requested by pt/family on 07/16/15     FL2 transmitted to all facilities within larger geographic area on       Patient informed that his/her managed care company has contracts with or will negotiate with certain facilities, including the following:        Yes   Patient/family informed of bed offers received.  Patient chooses bed at [facility]      Physician recommends and patient chooses bed at      Patient to be transferred to [facility] on  .  Patient to be transferred to facility by       Patient family notified on   of transfer.  Name of family member notified:        PHYSICIAN       Additional Comment:    _______________________________________________ Arlyss Repress, LCSW 07/19/2015, 2:19 PM

## 2015-07-19 NOTE — Progress Notes (Signed)
It has been about 9  Hours since foley was d/c'd.  Pt unable to urinate.  Has had at least 720 cc intake.  Bladder scan reveals between 135 and 243 cc's in bladder.  On call notified and am awaiting response.

## 2015-07-20 ENCOUNTER — Inpatient Hospital Stay
Admission: RE | Admit: 2015-07-20 | Discharge: 2015-08-09 | Disposition: A | Discharge status: Home / Self Care | Payer: Medicare Other | Source: Ambulatory Visit | Attending: Internal Medicine | Admitting: Internal Medicine

## 2015-07-20 DIAGNOSIS — M7989 Other specified soft tissue disorders: Principal | ICD-10-CM

## 2015-07-20 LAB — CBC
HEMATOCRIT: 25.3 % — AB (ref 36.0–46.0)
HEMOGLOBIN: 8.6 g/dL — AB (ref 12.0–15.0)
MCH: 32 pg (ref 26.0–34.0)
MCHC: 34 g/dL (ref 30.0–36.0)
MCV: 94.1 fL (ref 78.0–100.0)
Platelets: 98 10*3/uL — ABNORMAL LOW (ref 150–400)
RBC: 2.69 MIL/uL — ABNORMAL LOW (ref 3.87–5.11)
RDW: 13.8 % (ref 11.5–15.5)
WBC: 10.1 10*3/uL (ref 4.0–10.5)

## 2015-07-20 LAB — URINE CULTURE: Culture: NO GROWTH

## 2015-07-20 MED ORDER — HYDROCODONE-ACETAMINOPHEN 5-325 MG PO TABS: 1.0000 | ORAL_TABLET | Freq: Four times a day (QID) | ORAL | Status: DC | PRN

## 2015-07-20 MED ORDER — POLYETHYLENE GLYCOL 3350 17 G PO PACK: 17.0000 g | PACK | Freq: Every day | ORAL | Status: DC

## 2015-07-20 MED ORDER — TRAMADOL HCL 50 MG PO TABS: 50.0000 mg | ORAL_TABLET | Freq: Two times a day (BID) | ORAL | Status: DC | PRN

## 2015-07-20 MED ORDER — METHOCARBAMOL 500 MG PO TABS: 500.0000 mg | ORAL_TABLET | Freq: Four times a day (QID) | ORAL | Status: DC | PRN

## 2015-07-20 NOTE — Discharge Summary (Signed)
Physician Discharge Summary   Patient ID: [name] MRN: [id number] DOB/AGE: 03-20-1927 81 y.o.  Admit date: 07/15/2015 Discharge date: 07/20/2015  Primary Care Physician:  Cassell Smiles., MD  Discharge Diagnoses:    . left hip Hip fracture status post hemiarthroplasty  . Abdominal aortic aneurysm . Aortic heart murmur . Essential hypertension  Consults:  Orthopedics, Dr. Magnus Ivan   Recommendations for Outpatient Follow-up:  Per orthopedics ANTERIOR APPROACH HEMI HIP ARTHROPLASTY (Left) Up with therapy - WBAT left hip, no precautions Aspirin for DVT coverage only given fall risk.  Patient did have urinary retention after the hip surgery, continue Foley catheter for 1 week, then voiding trial. If patient still continues to have urinary retention issues, please refer to [facility] outpatient  TESTS THAT NEED FOLLOW-UP CBC, BMET   DIET:     Allergies:   Allergies  Allergen Reactions  . Sulfa Antibiotics Anaphylaxis  . Cephalexin Itching and Rash     Discharge Medications:   Medication List    STOP taking these medications        ALPRAZolam 0.5 MG tablet  Commonly known as:  XANAX     lisinopril-hydrochlorothiazide 20-25 MG per tablet  Commonly known as:  PRINZIDE,ZESTORETIC     valsartan-hydrochlorothiazide 160-12.5 MG per tablet  Commonly known as:  DIOVAN-HCT      TAKE these medications        aspirin 325 MG EC tablet  Take 1 tablet (325 mg total) by mouth daily with breakfast.     gabapentin 100 MG capsule  Commonly known as:  NEURONTIN  Take 100-300 mg by mouth 3 (three) times daily.     HYDROcodone-acetaminophen 5-325 MG per tablet  Commonly known as:  NORCO/VICODIN  Take 1 tablet by mouth every 6 (six) hours as needed for moderate pain.     methocarbamol 500 MG tablet  Commonly known as:  ROBAXIN  Take 1 tablet (500 mg total) by mouth every 6 (six) hours as needed for muscle spasms.     polyethylene glycol packet  Commonly  known as:  MIRALAX / GLYCOLAX  Take 17 g by mouth daily.     pravastatin 40 MG tablet  Commonly known as:  PRAVACHOL  Take 40 mg by mouth at bedtime.     traMADol 50 MG tablet  Commonly known as:  ULTRAM  Take 1 tablet (50 mg total) by mouth every 12 (twelve) hours as needed for moderate pain.         Brief H and P: For complete details please refer to admission H and P, but in briefPatient is a 81 year old female with hypertension, GERD, neuropathy, dyslipidemia, AAA, presented on 8/18 with mechanical fall. Patient had reported that she was watering her tomatoes in the garden and suddenly stepped over her waterhose and fell down. Patient was found to have left hip fracture. Orthopedics was consulted. Patient underwent left hemi-hip arthroplasty on 8/20.  Hospital Course:  Left Hip fracture: Postop day #3 Currently stable. Orthopedics was consulted and patient underwent left hemi-hip arthroplasty on 8/20. Postoperatively hemoglobin did drop to 9.3 however did not require any transfusion, currently stable. Please check CBC on Thursday, 8/25 to follow closely. H&H has remained stable. Per orthopedics, continue aspirin only for the DVT prophylaxis due to high fall risk WBAT left hip  Acute on chronic hypoxic respiratory failure- improved, possibly due to mild acute on chronic diastolic CHF - Currently stable, 2-D echo 8/19 showed EF of 60%, no wall motion abnormalities. Patient received  Lasix IV on 8/19.  Acute blood loss anemia postoperative - Currently H&H stable, follow CBC closely  Acute urinary retention: Postoperatively - Bladder scan showed 850 mL, UA did not show any UTI. Please continue Foley catheter for one week and then voiding trial. If patient continues to have any retention issues, please refer to [facility] for further management.    Mild acute kidney injury: Possibly due to diuresis, received Lasix on 8/19 - Now resolved  Essential hypertension - Currently  stable, holding Diovan/HCTZ  Hyperlipidemia  - Continue Pravachol   Day of Discharge BP 112/86 mmHg  Pulse 87  Temp(Src) 98.2 F (36.8 C) (Oral)  Resp 20  Ht 5\' 9"  (1.753 m)  Wt 75.1 kg (165 lb 9.1 oz)  BMI 24.44 kg/m2  SpO2 95%  Physical Exam: General: Alert and awake oriented x3 not in any acute distress. HEENT: anicteric sclera, pupils reactive to light and accommodation CVS: S1-S2 clear no murmur rubs or gallops Chest: clear to auscultation bilaterally, no wheezing rales or rhonchi Abdomen: soft nontender, nondistended, normal bowel sounds Extremities: no cyanosis, clubbing or edema noted bilaterally    The results of significant diagnostics from this hospitalization (including imaging, microbiology, ancillary and laboratory) are listed below for reference.    LAB RESULTS: Basic Metabolic Panel:  Recent Labs Lab 07/18/15 0639 07/19/15 0518  NA 132* 132*  K 3.7 3.5  CL 101 100*  CO2 25 29  GLUCOSE 103* 105*  BUN 40* 34*  CREATININE 0.90 0.81  CALCIUM 7.8* 8.0*   Liver Function Tests:  Recent Labs Lab 07/16/15 0516  AST 55*  ALT 28  ALKPHOS 77  BILITOT 2.9*  PROT 6.6  ALBUMIN 2.8*   No results for input(s): LIPASE, AMYLASE in the last 168 hours. No results for input(s): AMMONIA in the last 168 hours. CBC:  Recent Labs Lab 07/17/15 0553  07/19/15 0518 07/20/15 0520  WBC 9.7  < > 8.9 10.1  NEUTROABS 7.1  --   --   --   HGB 11.0*  < > 10.0* 8.6*  HCT 33.7*  < > 28.5* 25.3*  MCV 96.3  < > 94.7 94.1  PLT 72*  < > 83* 98*  < > = values in this interval not displayed. Cardiac Enzymes:  Recent Labs Lab 07/16/15 1126 07/16/15 1715  CKTOTAL 234  --   TROPONINI 0.05* 0.04*   BNP: Invalid input(s): POCBNP CBG: No results for input(s): GLUCAP in the last 168 hours.  Significant Diagnostic Studies:  Ct Head Wo Contrast  07/16/2015   CLINICAL DATA:  Fall with hip fracture  EXAM: CT HEAD WITHOUT CONTRAST  CT CERVICAL SPINE WITHOUT CONTRAST   TECHNIQUE: Multidetector CT imaging of the head and cervical spine was performed following the standard protocol without intravenous contrast. Multiplanar CT image reconstructions of the cervical spine were also generated.  COMPARISON:  05/27/2010 head CT  FINDINGS: CT HEAD FINDINGS  Skull and Sinuses:Negative for fracture or destructive process. The mastoids, middle ears, and imaged paranasal sinuses are clear.  Orbits: No acute abnormality.  Brain: No evidence of acute infarction, hemorrhage, hydrocephalus, or mass lesion/mass effect. Cerebral volume is within normal limits for age. Probable remote lacunar infarct in the left caudate head.  CT CERVICAL SPINE FINDINGS  Negative for acute fracture or subluxation. No prevertebral edema. No gross cervical canal hematoma. No significant osseous canal or foraminal stenosis.  Biapical emphysema.  IMPRESSION: No evidence of intracranial or cervical spine injury.   Electronically Signed  By: Marnee Spring M.D.   On: 07/16/2015 04:59   Ct Cervical Spine Wo Contrast  07/16/2015   CLINICAL DATA:  Fall with hip fracture  EXAM: CT HEAD WITHOUT CONTRAST  CT CERVICAL SPINE WITHOUT CONTRAST  TECHNIQUE: Multidetector CT imaging of the head and cervical spine was performed following the standard protocol without intravenous contrast. Multiplanar CT image reconstructions of the cervical spine were also generated.  COMPARISON:  05/27/2010 head CT  FINDINGS: CT HEAD FINDINGS  Skull and Sinuses:Negative for fracture or destructive process. The mastoids, middle ears, and imaged paranasal sinuses are clear.  Orbits: No acute abnormality.  Brain: No evidence of acute infarction, hemorrhage, hydrocephalus, or mass lesion/mass effect. Cerebral volume is within normal limits for age. Probable remote lacunar infarct in the left caudate head.  CT CERVICAL SPINE FINDINGS  Negative for acute fracture or subluxation. No prevertebral edema. No gross cervical canal hematoma. No significant  osseous canal or foraminal stenosis.  Biapical emphysema.  IMPRESSION: No evidence of intracranial or cervical spine injury.   Electronically Signed   By: Marnee Spring M.D.   On: 07/16/2015 04:59   US Aorta  07/16/2015   CLINICAL DATA:  Abdominal aortic aneurysm  EXAM: ULTRASOUND OF ABDOMINAL AORTA  TECHNIQUE: Ultrasound examination of the abdominal aorta was performed to evaluate for abdominal aortic aneurysm.  COMPARISON:  None.  FINDINGS: Known abdominal aortic aneurysm which is infrarenal. Maximal dimensions are 32 mm AP and 35 mm transverse. This is only 1 mm larger than in 2013 by CT. The bilateral common iliac arteries have normal diameter of 13 and 15 mm. The proximal most aorta is obscured by bowel gas, but this area was non aneurysmal on abdominal CT in 2013. No gross indication of aortic dissection.  IMPRESSION: 35 mm infrarenal aortic aneurysm, 1 mm larger than in 2013.   Electronically Signed   By: Marnee Spring M.D.   On: 07/16/2015 05:08   Dg Chest Port 1 View  07/15/2015   CLINICAL DATA:  Larey Seat.  Left femoral neck fracture.  EXAM: PORTABLE CHEST - 1 VIEW  COMPARISON:  04/02/2015.  FINDINGS: Interval mild cardiomegaly and prominent pulmonary vasculature. Stable mild prominence of the interstitial markings. No fracture or pneumothorax seen. Cervical spine degenerative changes.  IMPRESSION: Interval mild cardiomegaly and pulmonary vascular congestion with stable underlying chronic interstitial lung disease.   Electronically Signed   By: Beckie Salts M.D.   On: 07/15/2015 22:17   Dg Hip Unilat With Pelvis 2-3 Views Left  07/15/2015   CLINICAL DATA:  Left hip pain and leg rotation following a fall in her yard today.  EXAM: DG HIP (WITH OR WITHOUT PELVIS) 2-3V LEFT  COMPARISON:  None.  FINDINGS: Left femoral neck fracture with proximal displacement of the distal fragment and varus angulation. Lower lumbar spine degenerative changes.  IMPRESSION: Left femoral neck fracture, as described above.    Electronically Signed   By: Beckie Salts M.D.   On: 07/15/2015 22:06    2D ECHO:   Disposition and Follow-up:     Discharge Instructions    Diet - low sodium heart healthy    Complete by:  As directed      Full weight bearing    Complete by:  As directed   Laterality:  left  Extremity:  Lower     Increase activity slowly    Complete by:  As directed             DISPOSITION: Skilled nursing facility  DISCHARGE FOLLOW-UP Follow-up Information    Follow up with Kathryne Hitch, MD. Schedule an appointment as soon as possible for a visit in 2 weeks.   Specialty:  Orthopedic Surgery   Contact information:   [address] [place] [address] [phone]       Follow up with Cassell Smiles., MD. Schedule an appointment as soon as possible for a visit in 2 weeks.   Specialty:  Internal Medicine   Why:  for hospital follow-up   Contact information:   [address] [place] [address] [phone]        Time spent on Discharge: 35 minutes   Signed:   RAI,RIPUDEEP M.D. [facility] 07/20/2015, 10:11 AM Pager: [phone]

## 2015-07-20 NOTE — Clinical Social Work Placement (Signed)
Patient is set to discharge to [facility] today. Patient & son, Ron at bedside aware. Discharge packet given to RN, Skeet Simmer. [facility] called for transport.     Lincoln Maxin, LCSW [facility] Clinical Social Worker cell #: [phone]    CLINICAL SOCIAL WORK PLACEMENT  NOTE  Date:  07/20/2015  Patient Details  Name: Marice Brumleve Whetsel MRN: [id number] Date of Birth: [birth year]  Clinical Social Work is seeking post-discharge placement for this patient at the Skilled  Nursing Facility level of care (*CSW will initial, date and re-position this form in  chart as items are completed):  Yes   Patient/family provided with [facility] Clinical Social Work Department's list of facilities offering this level of care within the geographic area requested by the patient (or if unable, by the patient's family).  Yes   Patient/family informed of their freedom to choose among providers that offer the needed level of care, that participate in Medicare, Medicaid or managed care program needed by the patient, have an available bed and are willing to accept the patient.  Yes   Patient/family informed of [facility]'s ownership interest in [facility] and [facility], as well as of the fact that they are under no obligation to receive care at these facilities.  PASRR submitted to EDS on 07/16/15     PASRR number received on 07/16/15     Existing PASRR number confirmed on       FL2 transmitted to all facilities in geographic area requested by pt/family on 07/16/15     FL2 transmitted to all facilities within larger geographic area on       Patient informed that his/her managed care company has contracts with or will negotiate with certain facilities, including the following:        Yes   Patient/family informed of bed offers received.  Patient chooses bed at [facility]     Physician recommends and patient chooses bed at      Patient to be transferred to  [facility] on 07/20/15.  Patient to be transferred to facility by [facility]     Patient family notified on 07/20/15 of transfer.  Name of family member notified:  patient's son, Ron at bedside     PHYSICIAN       Additional Comment:    _______________________________________________ Arlyss Repress, LCSW 07/20/2015, 11:40 AM

## 2015-07-20 NOTE — Progress Notes (Signed)
Report called to [facility] and given to [facility].  Patient ready for discharge.  Awaiting [facility] for transport.

## 2015-07-20 NOTE — Care Management Note (Signed)
Case Management Note  Patient Details  Name: Khristian Artiga Gunnarson MRN: [id number] Date of Birth: [birth year]  Subjective/Objective:                    Action/Plan:d/c SNF   Expected Discharge Date:                  Expected Discharge Plan:  Skilled Nursing Facility  In-House Referral:  Clinical Social Work  Discharge planning Services  CM Consult  Post Acute Care Choice:    Choice offered to:     DME Arranged:    DME Agency:     HH Arranged:    HH Agency:     Status of Service:  Completed, signed off  Medicare Important Message Given:  Yes-second notification given Date Medicare IM Given:    Medicare IM give by:    Date Additional Medicare IM Given:    Additional Medicare Important Message give by:     If discussed at Long Length of Stay Meetings, dates discussed:    Additional Comments:  Lanier Clam, RN 07/20/2015, 10:17 AM

## 2015-07-21 ENCOUNTER — Non-Acute Institutional Stay (SKILLED_NURSING_FACILITY): Payer: Medicare Other | Admitting: Internal Medicine

## 2015-07-21 DIAGNOSIS — I10 Essential (primary) hypertension: Secondary | ICD-10-CM

## 2015-07-21 DIAGNOSIS — M810 Age-related osteoporosis without current pathological fracture: Secondary | ICD-10-CM | POA: Diagnosis not present

## 2015-07-21 DIAGNOSIS — S72142D Displaced intertrochanteric fracture of left femur, subsequent encounter for closed fracture with routine healing: Secondary | ICD-10-CM

## 2015-07-21 DIAGNOSIS — R339 Retention of urine, unspecified: Secondary | ICD-10-CM

## 2015-07-21 DIAGNOSIS — G609 Hereditary and idiopathic neuropathy, unspecified: Secondary | ICD-10-CM

## 2015-07-21 NOTE — Progress Notes (Signed)
Patient ID: [name], female   DOB: 1926/12/31, 81 y.o.   MRN: [id number]    Facility; [facility] Chief complaint; admission to SNF post admit to [facility] from 8/18 to 8/23  History; this is alert 81 year old who lives in [place] on her own. She apparently has stairs in both the front and back of her home. She fell in her garden while watering her tomatoes. She suffered a left hip fracture and underwent a left hip hemi- arthroplasty. She appears to tolerated the surgery well. She had postop anemia with a hemoglobin dropped to 9.3 however she did not require a transfusion. Other medical issues during the hospitalization included;  -Urinary retention requiring a Foley catheter. Over 800 cc of residual urine obtained. The patient does not give a history of this but did say that she is up 4 times a night to avoid. Urine culture was apparently negative  -Fluid volume overload requiring a dose of IV Lasix on 8/19 her 2-D echo showed an EF of 60%  ; Her Diovan/hydrochlorothiazide were put on hold not exactly sure of the issue here. May have been at the time she received IV Lasix  -Mild acute renal failure possibly due to diuresis  CBC Latest Ref Rng 07/20/2015 07/19/2015 07/18/2015  WBC 4.0 - 10.5 K/uL 10.1 8.9 7.6  Hemoglobin 12.0 - 15.0 g/dL 1.0(V) 10.0(L) 9.3(L)  Hematocrit 36.0 - 46.0 % 25.3(L) 28.5(L) 26.9(L)  Platelets 150 - 400 K/uL 98(L) 83(L) 69(L)   Lab Results  Component Value Date   CREATININE 0.81 07/19/2015   CREATININE 0.90 07/18/2015   CREATININE 1.51* 07/17/2015    Past Medical History  Diagnosis Date  . Hypertension   . GERD (gastroesophageal reflux disease)   . Neuromuscular disorder   . Hyperlipidemia     Past Surgical History  Procedure Laterality Date  . Abdominal aortic aneurysm repair    . Breast biopsy    . Inguinal hernia repair    . Cholecystectomy     Current Outpatient Prescriptions on File Prior to Visit  Medication Sig Dispense Refill  . aspirin  EC 325 MG EC tablet Take 1 tablet (325 mg total) by mouth daily with breakfast. 30 tablet 0  . gabapentin (NEURONTIN) 100 MG capsule Take 100-300 mg by mouth 3 (three) times daily.     Marland Kitchen HYDROcodone-acetaminophen (NORCO/VICODIN) 5-325 MG per tablet Take 1 tablet by mouth every 6 (six) hours as needed for moderate pain. 30 tablet 0  . methocarbamol (ROBAXIN) 500 MG tablet Take 1 tablet (500 mg total) by mouth every 6 (six) hours as needed for muscle spasms. 30 tablet 0  . polyethylene glycol (MIRALAX / GLYCOLAX) packet Take 17 g by mouth daily. 14 each 0  . pravastatin (PRAVACHOL) 40 MG tablet Take 40 mg by mouth at bedtime.    . traMADol (ULTRAM) 50 MG tablet Take 1 tablet (50 mg total) by mouth every 12 (twelve) hours as needed for moderate pain. 20 tablet 0    Social; the patient tells me she lives in her own home she has stairs at the front and back but not within the home. She using a cane but also has a walker at home. She does not drive. Her son does outside activities  reports that she quit smoking about 23 years ago. Her smoking use included Cigarettes. She smoked 0.50 packs per day. She does not have any smokeless tobacco history on file. She reports that she does not drink alcohol or use illicit  drugs.  indicated that her mother is deceased. She indicated that her father is deceased.    Review of systems; Gen; no weight loss HEENT; uses bilateral hearing aids Respiratory; no shortness of breath, no cough, Cardiac; no exertional chest pain, no palpitations GI; no abnormal pain, no change in bowel habits GU; no dysuria, no voiding difficulties However she states that she was up 4x to void Musculoskeletal; no joint pain. Left hip a bit uncomfortable. She had a DEXA scan in 2012 which showed osteopenia T score in the left femoral neck -2.4 Neurologic: Complains of some numbness in her hands and feet. No weakness described no orthostatic symptoms Endocrine; patient states she is not a  diabetic Psychiatric; patient does not feel depressed Gait; says she had one fall earlier this summer. Indeed she was in the ER but had no fractures.   Physical examination; Vitals; O2 sat 95% on room air, respirations 18 and unlabored, pulse rate 78 and regular Gen.; the patient does not appear to be in any distress HEENT; no oral lesions were seen. Communication is much better with her hearing aids. Lymph; none palpable in the clavicular, axillary or cervical areas Respiratory; clear air entry bilaterally, no wheezing,  Cardiac; S1-S2 normal, soft 2/6 systolic ejection murmur that does not radiate no gallops JVP does not seem elevated Abdomen; no liver no spleen, no tenderness, no masses GU; Foley catheter in place no suprapubic or costovertebral angle tenderness Extremities; peripheral pulp pulses are palpably normal. Some degree of stasis physiology but no edema and no evidence of a DVT Neurologic; cranial nerves within normal limits. She has weakness in the right hip flexor at 4 out of 5. Noted wasting in her thenar eminence in her hands bilaterally. She is diffusely hyporeflexic at 1+. Toes are downgoing Gait; deferred; I spoke to the physical therapist use as she is doing well in therapy. Mental status; no evidence of cognitive loss or depression  Impression/plan #1; left hip fracture status post left hemi-arthroplasty. She is weightbearing as tolerated. #2 probable now osteoporosis she had osteopenia in the left femoral neck area 4 years ago. She doesn't appear to have primary care in our system although she lives in [place]. I wonder about repeating her DEXA scan here #3 she is only on one adult strength aspirin daily for DVT prophylaxis due to "fall risk" #4 hypertension her lisinopril/hydrochlorothiazide were put on hold. #5 congestive heart failure in the hospital; given a single dose of Lasix. #6 postoperative anemia I will follow-up on this #7 hyperlipidemia on Pravachol  there should be no reason to check lipids while she is here #8 peripheral neuropathy? Do not see a hemoglobin A1c in the system and I'll order this #9 hearing loss; does much better with her hearing aids

## 2015-07-26 ENCOUNTER — Encounter (HOSPITAL_COMMUNITY)
Admission: RE | Admit: 2015-07-26 | Discharge: 2015-07-26 | Disposition: A | Discharge status: Home / Self Care | Payer: Medicare Other | Source: Skilled Nursing Facility | Attending: Internal Medicine | Admitting: Internal Medicine

## 2015-07-27 ENCOUNTER — Encounter (HOSPITAL_COMMUNITY)
Admission: AD | Admit: 2015-07-27 | Discharge: 2015-07-27 | Disposition: A | Discharge status: Home / Self Care | Payer: Medicare Other | Source: Skilled Nursing Facility | Attending: Internal Medicine | Admitting: Internal Medicine

## 2015-07-27 LAB — CBC WITH DIFFERENTIAL/PLATELET
BASOS PCT: 1 % (ref 0–1)
Basophils Absolute: 0 10*3/uL (ref 0.0–0.1)
EOS ABS: 0.2 10*3/uL (ref 0.0–0.7)
Eosinophils Relative: 2 % (ref 0–5)
HEMATOCRIT: 28.6 % — AB (ref 36.0–46.0)
Hemoglobin: 10 g/dL — ABNORMAL LOW (ref 12.0–15.0)
Lymphocytes Relative: 32 % (ref 12–46)
Lymphs Abs: 2.7 10*3/uL (ref 0.7–4.0)
MCH: 36.4 pg — AB (ref 26.0–34.0)
MCHC: 35 g/dL (ref 30.0–36.0)
MCV: 104 fL — ABNORMAL HIGH (ref 78.0–100.0)
MONO ABS: 0.7 10*3/uL (ref 0.1–1.0)
MONOS PCT: 8 % (ref 3–12)
Neutro Abs: 4.8 10*3/uL (ref 1.7–7.7)
Neutrophils Relative %: 57 % (ref 43–77)
Platelets: 225 10*3/uL (ref 150–400)
RBC: 2.75 MIL/uL — ABNORMAL LOW (ref 3.87–5.11)
RDW: 16.9 % — AB (ref 11.5–15.5)
WBC: 8.4 10*3/uL (ref 4.0–10.5)

## 2015-07-27 LAB — BASIC METABOLIC PANEL
Anion gap: 5 (ref 5–15)
BUN: 17 mg/dL (ref 6–20)
CALCIUM: 8.7 mg/dL — AB (ref 8.9–10.3)
CO2: 25 mmol/L (ref 22–32)
CREATININE: 0.75 mg/dL (ref 0.44–1.00)
Chloride: 104 mmol/L (ref 101–111)
GFR calc Af Amer: >60 mL/min (ref >60)
GFR calc non Af Amer: >60 mL/min (ref >60)
GLUCOSE: 91 mg/dL (ref 65–99)
Potassium: 4.2 mmol/L (ref 3.5–5.1)
Sodium: 134 mmol/L — ABNORMAL LOW (ref 135–145)

## 2015-07-28 LAB — HEMOGLOBIN A1C
HEMOGLOBIN A1C: 5.2 % (ref 4.8–5.6)
MEAN PLASMA GLUCOSE: 103 mg/dL

## 2015-07-28 LAB — VITAMIN D 25 HYDROXY (VIT D DEFICIENCY, FRACTURES): VIT D 25 HYDROXY: 34 ng/mL (ref 30.0–100.0)

## 2015-08-10 ENCOUNTER — Encounter: Payer: Self-pay | Admitting: Internal Medicine

## 2015-08-10 ENCOUNTER — Ambulatory Visit (HOSPITAL_COMMUNITY)
Admit: 2015-08-10 | Discharge: 2015-08-10 | Disposition: A | Discharge status: Home / Self Care | Payer: Medicare Other | Source: Ambulatory Visit | Attending: Internal Medicine | Admitting: Internal Medicine

## 2015-08-10 ENCOUNTER — Non-Acute Institutional Stay (SKILLED_NURSING_FACILITY): Payer: Medicare Other | Admitting: Internal Medicine

## 2015-08-10 DIAGNOSIS — S72009D Fracture of unspecified part of neck of unspecified femur, subsequent encounter for closed fracture with routine healing: Secondary | ICD-10-CM | POA: Diagnosis not present

## 2015-08-10 DIAGNOSIS — I10 Essential (primary) hypertension: Secondary | ICD-10-CM | POA: Diagnosis not present

## 2015-08-10 DIAGNOSIS — I509 Heart failure, unspecified: Secondary | ICD-10-CM | POA: Diagnosis not present

## 2015-08-10 DIAGNOSIS — R600 Localized edema: Secondary | ICD-10-CM | POA: Insufficient documentation

## 2015-08-10 NOTE — Progress Notes (Signed)
Patient ID: [name], female   DOB: 06-30-1927, 81 y.o.   MRN: [id number]      Facility; [facility] Chief complaint; discharge note  HPI this is alert 81 year old who lives in [place] on her own. She apparently has stairs in both the front and back of her home. She fell in her garden while watering her tomatoes. She suffered a left hip fracture and underwent a left hip hemi- arthroplasty. She appears to tolerated the surgery well. She had postop anemia with a hemoglobin dropped to 9.3 however she did not require a transfusion-and update a hemoglobin has risen up to 10.0 on August 30-. Other medical issues during the hospitalization included;  -Urinary retention requiring a Foley catheter. Over 800 cc of residual urine obtained. The patient does not give a history of this but did say that she is up 4 times a night to avoid. Urine culture was apparently negative  -Fluid volume overload requiring a dose of IV Lasix on 8/19 her 2-D echo showed an EF of 60%  ; Her Diovan/hydrochlorothiazide were put on hold not exactly sure of the issue here. May have been at the time she received IV Lasix  -Mild acute renal failure possibly due to diuresis this appears to be stable with recent creatinine of 0.75 BUN of 17 on August 30 Patient has done well during her rehabilitation here she has seen orthopedics-she is currently ambulating with a walker appears to be doing well but would benefit from continued PT and OT-.  She will be with her daughter for a while status post discharge.  She does have orders for Norco as well as tramadol as needed for pain she does not take these very often  CBC Latest Ref Rng 07/20/2015 07/19/2015 07/18/2015  WBC 4.0 - 10.5 K/uL 10.1 8.9 7.6  Hemoglobin 12.0 - 15.0 g/dL 4.0(J) 10.0(L) 9.3(L)  Hematocrit 36.0 - 46.0 % 25.3(L) 28.5(L) 26.9(L)  Platelets 150 - 400 K/uL 98(L) 83(L) 69(L)   Lab Results  Component Value Date   CREATININE 0.81 07/19/2015   CREATININE 0.90  07/18/2015   CREATININE 1.51* 07/17/2015    Past Medical History  Diagnosis Date  . Hypertension   . GERD (gastroesophageal reflux disease)   . Neuromuscular disorder   . Hyperlipidemia     Past Surgical History  Procedure Laterality Date  . Abdominal aortic aneurysm repair    . Breast biopsy    . Inguinal hernia repair    . Cholecystectomy     Current Outpatient Prescriptions on File Prior to Visit  Medication Sig Dispense Refill  . aspirin EC 325 MG EC tablet Take 1 tablet (325 mg total) by mouth daily with breakfast. 30 tablet 0  . gabapentin (NEURONTIN) 100 MG capsule Take 100-300 mg by mouth 3 (three) times daily.     Marland Kitchen HYDROcodone-acetaminophen (NORCO/VICODIN) 5-325 MG per tablet Take 1 tablet by mouth every 6 (six) hours as needed for moderate pain. 30 tablet 0  . methocarbamol (ROBAXIN) 500 MG tablet Take 1 tablet (500 mg total) by mouth every 6 (six) hours as needed for muscle spasms. 30 tablet 0  . polyethylene glycol (MIRALAX / GLYCOLAX) packet Take 17 g by mouth daily. 14 each 0  . pravastatin (PRAVACHOL) 40 MG tablet Take 40 mg by mouth at bedtime.    . traMADol (ULTRAM) 50 MG tablet Take 1 tablet (50 mg total) by mouth every 12 (twelve) hours as needed for moderate pain. 20 tablet 0    Social;  the patient  lives in her own home she has stairs at the front and back but not within the home. She uses a cane but also has a walker at home. She does not drive. Her son does outside activities  reports that she quit smoking about 23 years ago. Her smoking use included Cigarettes. She smoked 0.50 packs per day. She does not have any smokeless tobacco history on file. She reports that she does not drink alcohol or use illicit drugs.  indicated that her mother is deceased. She indicated that her father is deceased.    Review of systems; Gen; no fever or chills her weight appears to be relatively stable.  Skin does not complain of rashes or itching surgical site appears to  have healed unremarkably HEENT; uses bilateral hearing aids not complain a sore throat or nasal discharge Respiratory; no shortness of breath, no cough, Cardiac; no exertional chest pain, no palpitations and some edema on her left leg she says this is relatively baseline status post surgery and apparently improves when she has it elevated GI; no abnormal pain, no change in bowel habits GU; no dysuria, no voiding difficulties However she states that she was up 4x to void Musculoskeletal; no joint pain. Is able to ambulate without acute discomfort it appears. She had a DEXA scan in 2012 which showed osteopenia T score in the left femoral neck -2.4 Neurologic: Complains of some numbness in her hands and feet. No weakness described no orthostatic symptoms Endocrine; patient states she is not a diabetic Psychiatric; patient does not feel depressed Gait;  she had one fall earlier this summer. Indeed she was in the ER but had no fractures.   Physical examination;  Temperature 97.5 pulse 78 respirations 18 blood pressure 128/69-127/61 most recently weight appears to be relatively stable at 167.2 Gen.; the patient does not appear to be in any distress Her skin is warm and dry surgical site left hip appears unremarkable HEENT; no oral lesions were seen. Communication is much better with her hearing aids.  Respiratory; clear air entry bilaterally, no wheezing,  Cardiac; , soft 2/6 systolic ejection murmur that does not radiate no gallops JVP does not seem elevated--she has some increased ederally with some mild pedal edema-again she states this edema is somewhat chronic status post surgery -- improves when she has her left leg elevated Abdomen; positive bowel sounds no tenderness, no masses ema of her left leg versus the right pedal pulses are intact bilaterally slightly reduced in the feet bilat Extremities; peripheral pulp pulses are palpably normal. Muscle skeletal moves all extremities 4 is able  to stand without assistance and uses a walker. Appears to ambulate fairly well with a walker  Neurologic; cranial nerves within normal limits. Her speech is clear no lateralizing findings  Mental status; no evidence of cognitive loss or depression-pleasant and appropriate.  Labs.  07/27/2015.  WBC 8.4 hemoglobin 10.0 platelets 225.  Sodium 134 potassium 4.2 BUN 17 creatinine 0.75  Impression/plan #1; left hip fracture status post left hemi-arthroplasty. --She has done well with therapy will need continued PT and OT for strengthening as an outpatient she will have some family help at home at least for a while.  She is on aspirin for anticoagulation apparently conservative here secondary to fall risk.  She does have some edema of her left leg prior to discharge-- will obtain a venous Doppler to be safe and rule out a DVT  Vitamin D level was 34-this will warrant follow up  by primary care provider    #2 hypertension her lisinopril/hydrochlorothiazide was put on hold during her hospitalization-nonetheless her blood pressures appear to be fairly satisfactory most recently 128/69-127/61-I see systolics ranging from the low 100s up to 142.  #3 congestive heart failure in the hospital; given a single dose of Lasix--this does not really appear to have been an issue during her stay here.  #4 postoperative anemia --this appears stable to improved with hemoglobin 10.0 will have home health redraw this later this week to make sure continues to be stable #5 hyperlipidemia on Pravachol --says her stay here was quite short lipid panel was not pursued #6 peripheral neuropathy? She continues on Neurontin-hemoglobin A1c was 5.2 #7 hearing loss; does much better with her hearing aids  She appears to have done quite well here with her rehabilitation she will need continued PT and OT for strengthening-will discharge her on tramadol when necessary for pain apparently she does not take this often she is  weightbearing as tolerated-again she will have family support at home as well for the time being  [id number]-of note greater than 30 minutes spent on this discharge summary-greater than 50% of time spent coordinating plan of care including reviewing labs chart and speaking with nursing

## 2015-08-11 ENCOUNTER — Other Ambulatory Visit (HOSPITAL_COMMUNITY)
Admission: RE | Admit: 2015-08-11 | Discharge: 2015-08-11 | Disposition: A | Discharge status: Home / Self Care | Payer: Medicare Other | Source: Other Acute Inpatient Hospital | Attending: Internal Medicine | Admitting: Internal Medicine

## 2015-08-11 DIAGNOSIS — I501 Left ventricular failure: Secondary | ICD-10-CM | POA: Insufficient documentation

## 2015-08-11 LAB — CBC WITH DIFFERENTIAL/PLATELET
Basophils Absolute: 0 10*3/uL (ref 0.0–0.1)
Basophils Relative: 0 %
EOS ABS: 0.1 10*3/uL (ref 0.0–0.7)
Eosinophils Relative: 2 %
HCT: 27 % — ABNORMAL LOW (ref 36.0–46.0)
HEMOGLOBIN: 9.4 g/dL — AB (ref 12.0–15.0)
LYMPHS ABS: 2 10*3/uL (ref 0.7–4.0)
LYMPHS PCT: 40 %
MCH: 37.5 pg — AB (ref 26.0–34.0)
MCHC: 34.8 g/dL (ref 30.0–36.0)
MCV: 107.6 fL — AB (ref 78.0–100.0)
Monocytes Absolute: 0.5 10*3/uL (ref 0.1–1.0)
Monocytes Relative: 10 %
NEUTROS PCT: 48 %
Neutro Abs: 2.4 10*3/uL (ref 1.7–7.7)
Platelets: 150 10*3/uL (ref 150–400)
RBC: 2.51 MIL/uL — AB (ref 3.87–5.11)
RDW: 17.5 % — ABNORMAL HIGH (ref 11.5–15.5)
WBC: 4.9 10*3/uL (ref 4.0–10.5)

## 2015-08-11 LAB — BASIC METABOLIC PANEL
ANION GAP: 4 — AB (ref 5–15)
BUN: 15 mg/dL (ref 6–20)
CHLORIDE: 110 mmol/L (ref 101–111)
CO2: 24 mmol/L (ref 22–32)
Calcium: 8.6 mg/dL — ABNORMAL LOW (ref 8.9–10.3)
Creatinine, Ser: 0.72 mg/dL (ref 0.44–1.00)
GFR calc non Af Amer: >60 mL/min (ref >60)
Glucose, Bld: 108 mg/dL — ABNORMAL HIGH (ref 65–99)
POTASSIUM: 3.8 mmol/L (ref 3.5–5.1)
SODIUM: 138 mmol/L (ref 135–145)

## 2016-03-03 DIAGNOSIS — J060 Acute laryngopharyngitis: Secondary | ICD-10-CM | POA: Diagnosis not present

## 2016-03-17 DIAGNOSIS — E782 Mixed hyperlipidemia: Secondary | ICD-10-CM | POA: Diagnosis not present

## 2016-03-21 DIAGNOSIS — E782 Mixed hyperlipidemia: Secondary | ICD-10-CM | POA: Diagnosis not present

## 2016-03-21 DIAGNOSIS — G629 Polyneuropathy, unspecified: Secondary | ICD-10-CM | POA: Diagnosis not present

## 2016-03-21 DIAGNOSIS — D509 Iron deficiency anemia, unspecified: Secondary | ICD-10-CM | POA: Diagnosis not present

## 2016-03-21 DIAGNOSIS — I10 Essential (primary) hypertension: Secondary | ICD-10-CM | POA: Diagnosis not present

## 2016-05-03 DIAGNOSIS — L570 Actinic keratosis: Secondary | ICD-10-CM | POA: Diagnosis not present

## 2016-05-03 DIAGNOSIS — L821 Other seborrheic keratosis: Secondary | ICD-10-CM | POA: Diagnosis not present

## 2016-05-03 DIAGNOSIS — D485 Neoplasm of uncertain behavior of skin: Secondary | ICD-10-CM | POA: Diagnosis not present

## 2016-05-03 DIAGNOSIS — D692 Other nonthrombocytopenic purpura: Secondary | ICD-10-CM | POA: Diagnosis not present

## 2016-05-03 DIAGNOSIS — I872 Venous insufficiency (chronic) (peripheral): Secondary | ICD-10-CM | POA: Diagnosis not present

## 2016-06-02 DIAGNOSIS — G629 Polyneuropathy, unspecified: Secondary | ICD-10-CM | POA: Diagnosis not present

## 2016-06-02 DIAGNOSIS — I890 Lymphedema, not elsewhere classified: Secondary | ICD-10-CM | POA: Diagnosis not present

## 2016-06-16 ENCOUNTER — Encounter (HOSPITAL_COMMUNITY): Payer: Self-pay | Admitting: Emergency Medicine

## 2016-06-16 ENCOUNTER — Emergency Department (HOSPITAL_COMMUNITY): Payer: Medicare Other

## 2016-06-16 ENCOUNTER — Emergency Department (HOSPITAL_COMMUNITY)
Admission: EM | Admit: 2016-06-16 | Discharge: 2016-06-16 | Disposition: A | Discharge status: Home / Self Care | Payer: Medicare Other | Attending: Emergency Medicine | Admitting: Emergency Medicine

## 2016-06-16 DIAGNOSIS — Y92410 Unspecified street and highway as the place of occurrence of the external cause: Secondary | ICD-10-CM | POA: Insufficient documentation

## 2016-06-16 DIAGNOSIS — S59902A Unspecified injury of left elbow, initial encounter: Secondary | ICD-10-CM | POA: Diagnosis present

## 2016-06-16 DIAGNOSIS — Z87891 Personal history of nicotine dependence: Secondary | ICD-10-CM | POA: Diagnosis not present

## 2016-06-16 DIAGNOSIS — Y9389 Activity, other specified: Secondary | ICD-10-CM | POA: Diagnosis not present

## 2016-06-16 DIAGNOSIS — S51012A Laceration without foreign body of left elbow, initial encounter: Secondary | ICD-10-CM | POA: Diagnosis not present

## 2016-06-16 DIAGNOSIS — Z7982 Long term (current) use of aspirin: Secondary | ICD-10-CM | POA: Diagnosis not present

## 2016-06-16 DIAGNOSIS — I10 Essential (primary) hypertension: Secondary | ICD-10-CM | POA: Insufficient documentation

## 2016-06-16 DIAGNOSIS — Y999 Unspecified external cause status: Secondary | ICD-10-CM | POA: Diagnosis not present

## 2016-06-16 DIAGNOSIS — S50312A Abrasion of left elbow, initial encounter: Secondary | ICD-10-CM | POA: Diagnosis not present

## 2016-06-16 MED ORDER — TETANUS-DIPHTH-ACELL PERTUSSIS 5-2.5-18.5 LF-MCG/0.5 IM SUSP
0.5000 mL | Freq: Once | INTRAMUSCULAR | Status: AC
  Administered 2016-06-16: 0.5 mL via INTRAMUSCULAR
  Filled 2016-06-16: qty 0.5
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
## 2016-07-11 DIAGNOSIS — E782 Mixed hyperlipidemia: Secondary | ICD-10-CM | POA: Diagnosis not present

## 2016-07-11 DIAGNOSIS — D509 Iron deficiency anemia, unspecified: Secondary | ICD-10-CM | POA: Diagnosis not present

## 2016-08-16 DIAGNOSIS — R109 Unspecified abdominal pain: Secondary | ICD-10-CM | POA: Diagnosis not present

## 2016-08-16 DIAGNOSIS — R1084 Generalized abdominal pain: Secondary | ICD-10-CM | POA: Diagnosis not present

## 2016-08-25 DIAGNOSIS — G629 Polyneuropathy, unspecified: Secondary | ICD-10-CM | POA: Diagnosis not present

## 2016-08-25 DIAGNOSIS — D509 Iron deficiency anemia, unspecified: Secondary | ICD-10-CM | POA: Diagnosis not present

## 2016-08-25 DIAGNOSIS — E782 Mixed hyperlipidemia: Secondary | ICD-10-CM | POA: Diagnosis not present

## 2016-08-25 DIAGNOSIS — I10 Essential (primary) hypertension: Secondary | ICD-10-CM | POA: Diagnosis not present

## 2016-08-25 DIAGNOSIS — Z23 Encounter for immunization: Secondary | ICD-10-CM | POA: Diagnosis not present

## 2016-10-12 IMAGING — CR DG CHEST 1V PORT
1 series · 1 of 1 positions shown · non-contrast
Comparison: 04/02/2015.

CLINICAL DATA: Fell.  Left femoral neck fracture.

EXAM:
PORTABLE CHEST - 1 VIEW

[AP]
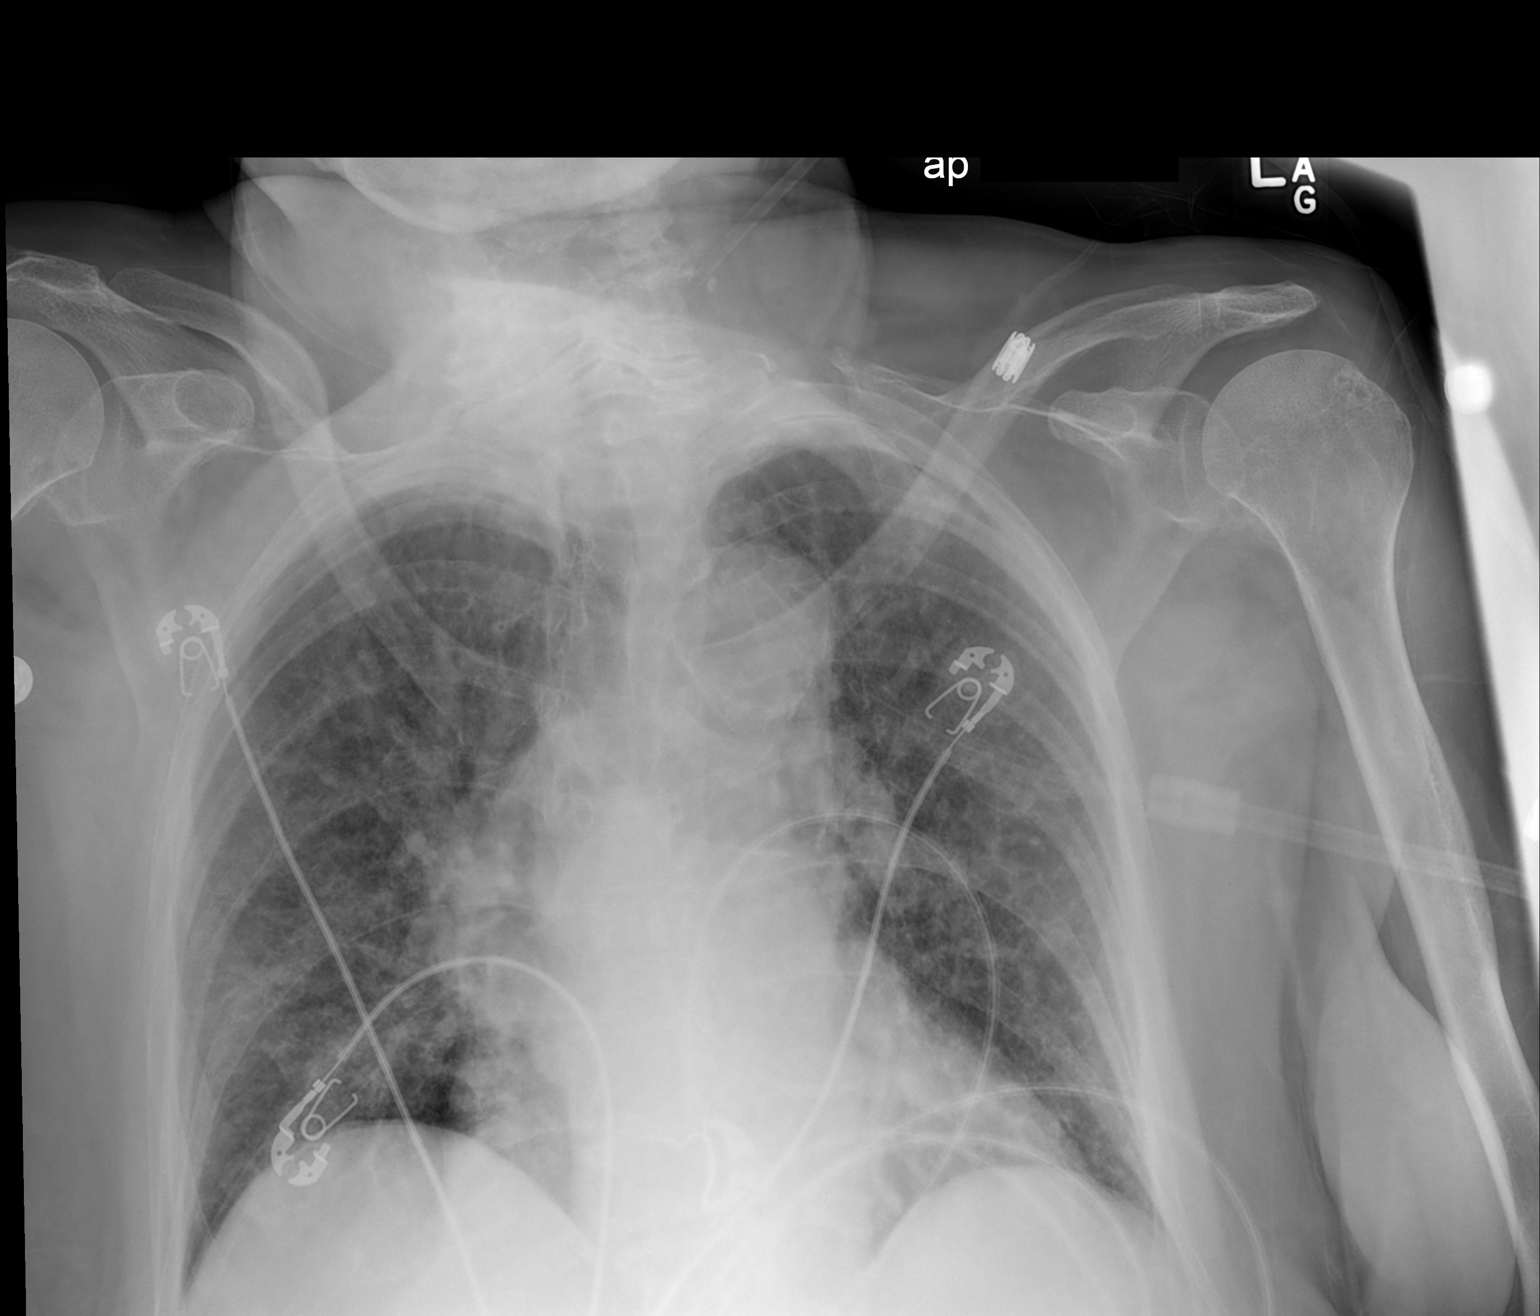

[1 of 1 positions shown; findings below may reference images not displayed]

FINDINGS: Interval mild cardiomegaly and prominent pulmonary vasculature.
Stable mild prominence of the interstitial markings. No fracture or
pneumothorax seen. Cervical spine degenerative changes.
IMPRESSION: Interval mild cardiomegaly and pulmonary vascular congestion with
stable underlying chronic interstitial lung disease.

## 2016-10-13 IMAGING — CT CT HEAD W/O CM
5 of 6 series · 17 of 47 positions shown, 19 images · non-contrast
Comparison: 05/27/2010 head CT

CLINICAL DATA: Fall with hip fracture

EXAM:
CT HEAD WITHOUT CONTRAST
CT CERVICAL SPINE WITHOUT CONTRAST
TECHNIQUE: Multidetector CT imaging of the head and cervical spine was
performed following the standard protocol without intravenous
contrast. Multiplanar CT image reconstructions of the cervical spine
were also generated.

[Series 3: head_seq 4.5 h37s st · axial · 0.43mm/px · z∈[-141,-28]mm · 6 of 36 slices shown, 8 images]
[im 6/36  brain]
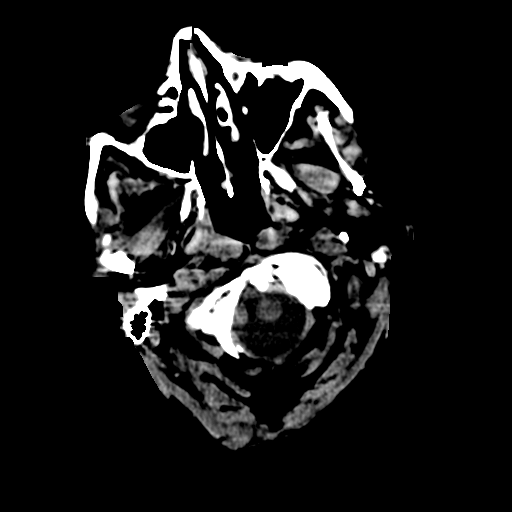
[im 6/36  bone]
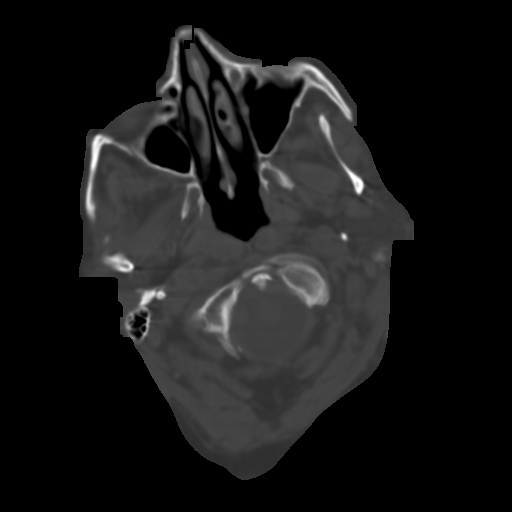
[im 11/36  brain]
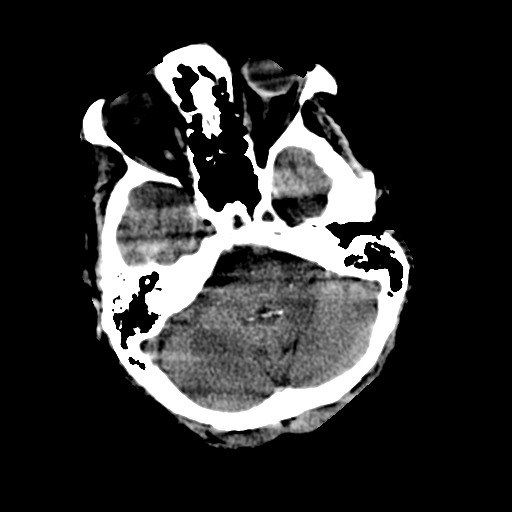
[im 16/36  brain]
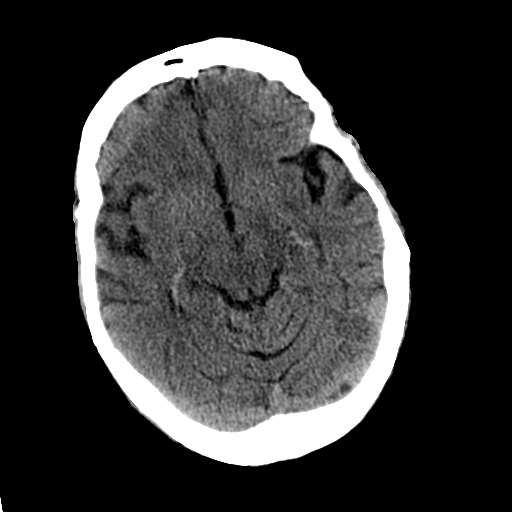
[im 21/36  brain]
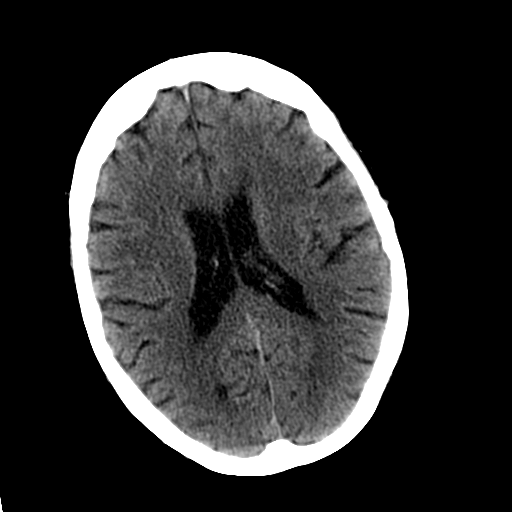
[im 26/36  brain]
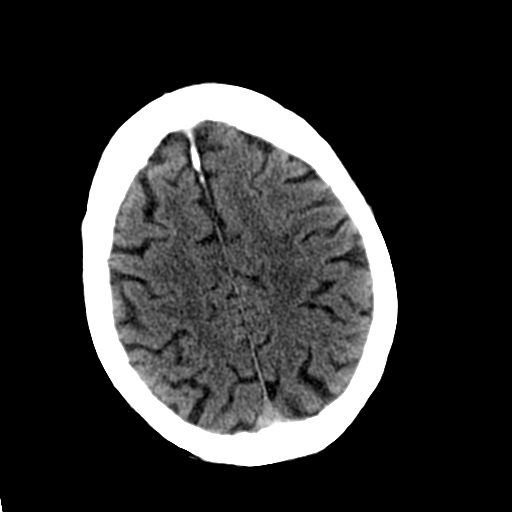
[im 26/36  bone]
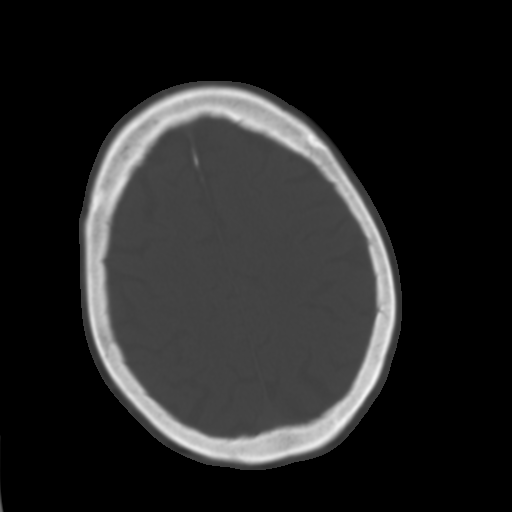
[im 31/36  brain]
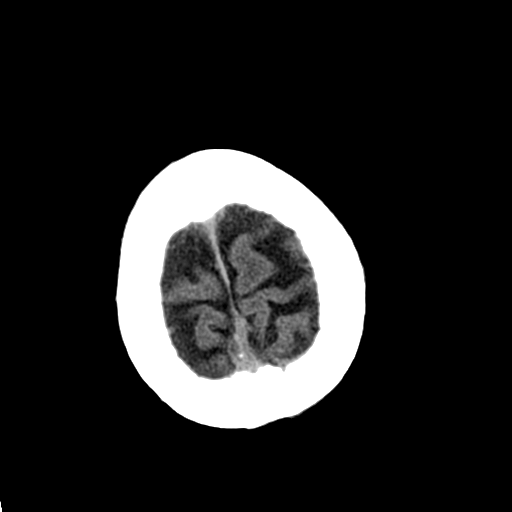

[Series 6: head_seq 3.0 h60s bone · axial · 0.43mm/px · z∈[-131,-113]mm · 2 of 18 slices shown]
[im 6/18  bone]
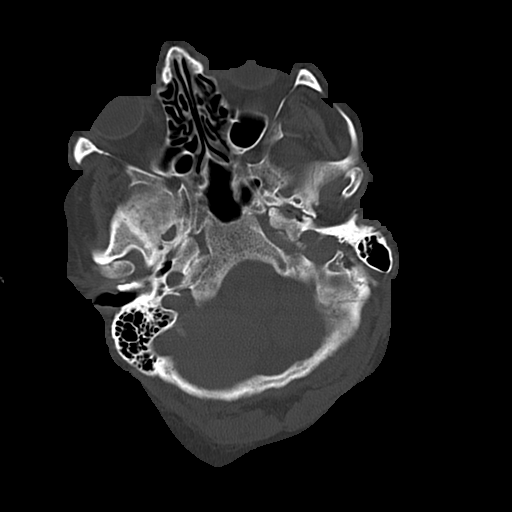
[im 12/18  bone]
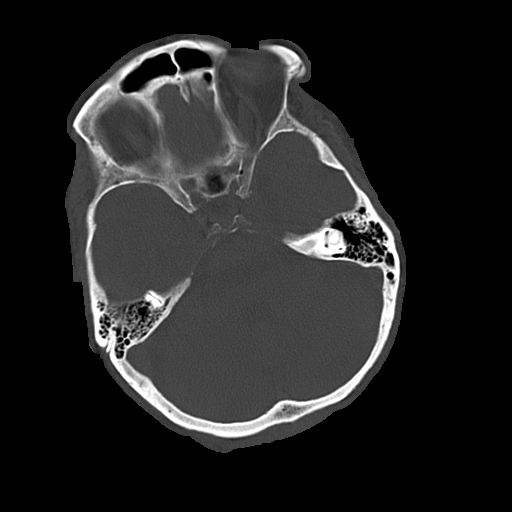

[Series 7: c_spine 2.0 b31s · axial · 0.27mm/px · z∈[-282,-240]mm · 3 of 80 slices shown]
[im 6/80  brain]
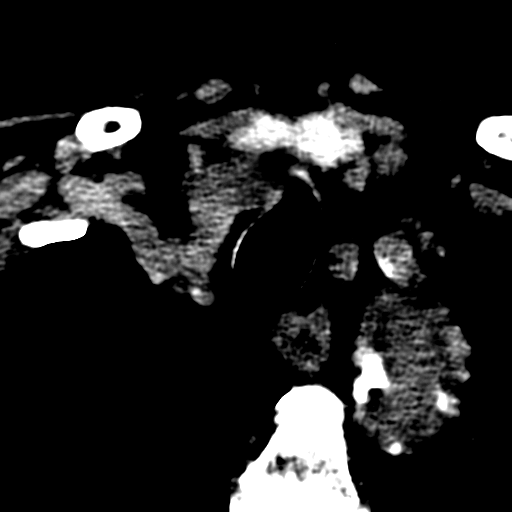
[im 16/80  brain]
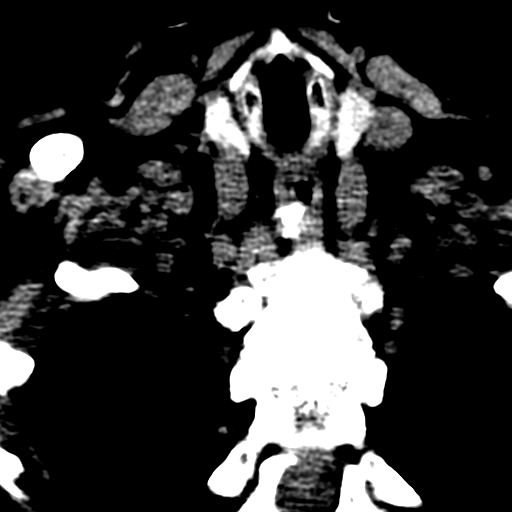
[im 27/80  brain]
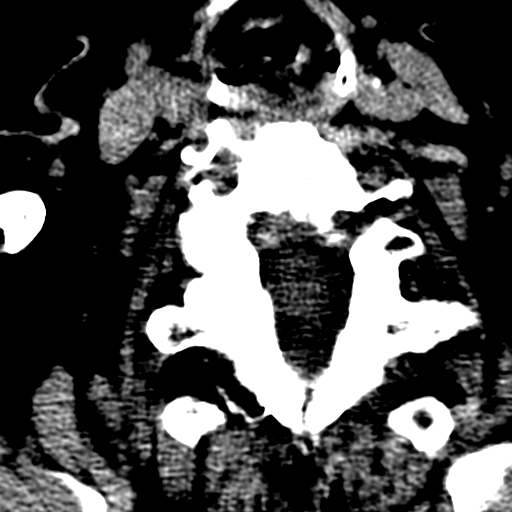

[Series 602: <mpr thick range> · sagittal · 0.31mm/px · 3 of 48 slices shown]
[im 17/48  brain]
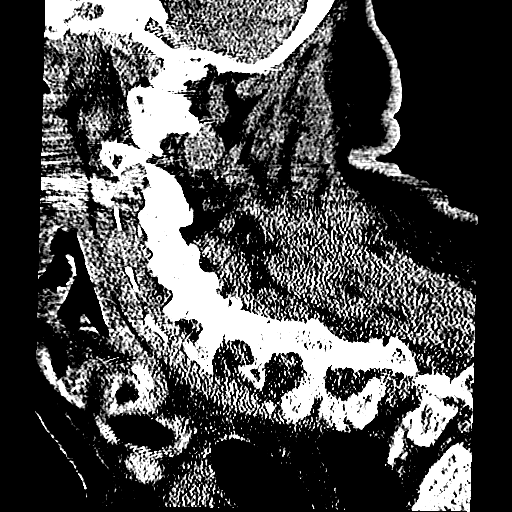
[im 24/48  brain]
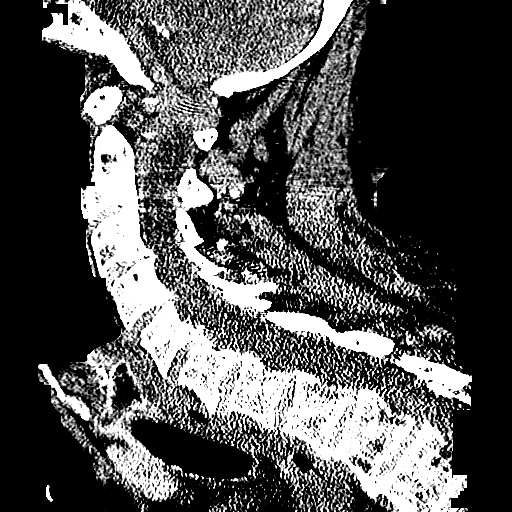
[im 31/48  brain]
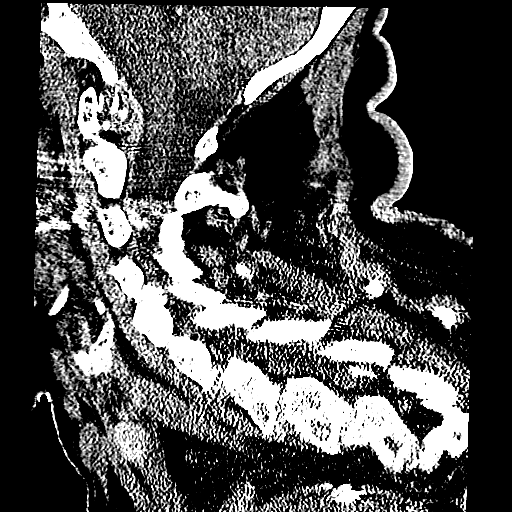

[Series 604: <mpr thick range(2)> · coronal · 0.31mm/px · 3 of 49 slices shown]
[im 17/49  brain]
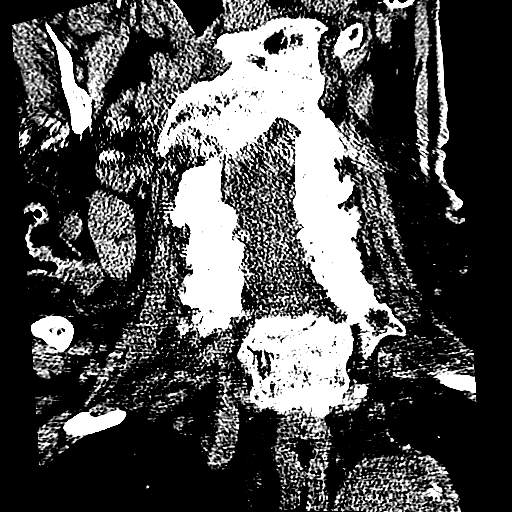
[im 22/49  brain]
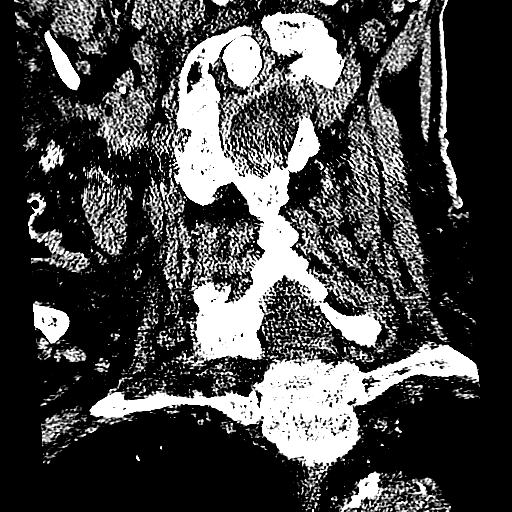
[im 27/49  brain]
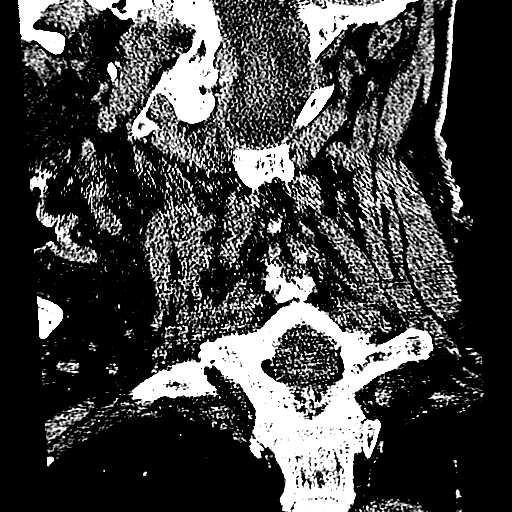

[17 of 47 positions shown; findings below may reference images not displayed]

FINDINGS: CT HEAD FINDINGS

Skull and Sinuses:Negative for fracture or destructive process. The
mastoids, middle ears, and imaged paranasal sinuses are clear.

Orbits: No acute abnormality.

Brain: No evidence of acute infarction, hemorrhage, hydrocephalus,
or mass lesion/mass effect. Cerebral volume is within normal limits
for age. Probable remote lacunar infarct in the left caudate head.

CT CERVICAL SPINE FINDINGS

Negative for acute fracture or subluxation. No prevertebral edema.
No gross cervical canal hematoma. No significant osseous canal or
foraminal stenosis.

Biapical emphysema.
IMPRESSION: No evidence of intracranial or cervical spine injury.

## 2017-03-22 ENCOUNTER — Emergency Department (HOSPITAL_COMMUNITY): Payer: Medicare Other

## 2017-03-22 ENCOUNTER — Inpatient Hospital Stay (HOSPITAL_COMMUNITY)
Admission: EM | Admit: 2017-03-22 | Discharge: 2017-03-27 | DRG: 871 | Disposition: A | Discharge status: Skilled Nursing Facility | Payer: Medicare Other | Attending: Internal Medicine | Admitting: Internal Medicine

## 2017-03-22 ENCOUNTER — Encounter (HOSPITAL_COMMUNITY): Payer: Self-pay

## 2017-03-22 DIAGNOSIS — J181 Lobar pneumonia, unspecified organism: Secondary | ICD-10-CM | POA: Diagnosis not present

## 2017-03-22 DIAGNOSIS — R451 Restlessness and agitation: Secondary | ICD-10-CM | POA: Diagnosis not present

## 2017-03-22 DIAGNOSIS — I5033 Acute on chronic diastolic (congestive) heart failure: Secondary | ICD-10-CM | POA: Diagnosis present

## 2017-03-22 DIAGNOSIS — R1310 Dysphagia, unspecified: Secondary | ICD-10-CM

## 2017-03-22 DIAGNOSIS — N3000 Acute cystitis without hematuria: Secondary | ICD-10-CM

## 2017-03-22 DIAGNOSIS — Z87891 Personal history of nicotine dependence: Secondary | ICD-10-CM

## 2017-03-22 DIAGNOSIS — Z66 Do not resuscitate: Secondary | ICD-10-CM | POA: Diagnosis present

## 2017-03-22 DIAGNOSIS — R7989 Other specified abnormal findings of blood chemistry: Secondary | ICD-10-CM | POA: Diagnosis not present

## 2017-03-22 DIAGNOSIS — J9601 Acute respiratory failure with hypoxia: Secondary | ICD-10-CM | POA: Diagnosis not present

## 2017-03-22 DIAGNOSIS — R509 Fever, unspecified: Secondary | ICD-10-CM | POA: Diagnosis not present

## 2017-03-22 DIAGNOSIS — I10 Essential (primary) hypertension: Secondary | ICD-10-CM | POA: Diagnosis present

## 2017-03-22 DIAGNOSIS — K224 Dyskinesia of esophagus: Secondary | ICD-10-CM | POA: Diagnosis present

## 2017-03-22 DIAGNOSIS — Z79899 Other long term (current) drug therapy: Secondary | ICD-10-CM | POA: Diagnosis not present

## 2017-03-22 DIAGNOSIS — E876 Hypokalemia: Secondary | ICD-10-CM | POA: Diagnosis not present

## 2017-03-22 DIAGNOSIS — J9811 Atelectasis: Secondary | ICD-10-CM | POA: Diagnosis not present

## 2017-03-22 DIAGNOSIS — I5032 Chronic diastolic (congestive) heart failure: Secondary | ICD-10-CM | POA: Diagnosis not present

## 2017-03-22 DIAGNOSIS — Z9181 History of falling: Secondary | ICD-10-CM | POA: Diagnosis not present

## 2017-03-22 DIAGNOSIS — G9340 Encephalopathy, unspecified: Secondary | ICD-10-CM | POA: Diagnosis present

## 2017-03-22 DIAGNOSIS — J180 Bronchopneumonia, unspecified organism: Secondary | ICD-10-CM | POA: Diagnosis not present

## 2017-03-22 DIAGNOSIS — K7290 Hepatic failure, unspecified without coma: Secondary | ICD-10-CM | POA: Diagnosis present

## 2017-03-22 DIAGNOSIS — I714 Abdominal aortic aneurysm, without rupture: Secondary | ICD-10-CM | POA: Diagnosis not present

## 2017-03-22 DIAGNOSIS — G629 Polyneuropathy, unspecified: Secondary | ICD-10-CM | POA: Diagnosis present

## 2017-03-22 DIAGNOSIS — N390 Urinary tract infection, site not specified: Secondary | ICD-10-CM | POA: Diagnosis not present

## 2017-03-22 DIAGNOSIS — A419 Sepsis, unspecified organism: Principal | ICD-10-CM | POA: Diagnosis present

## 2017-03-22 DIAGNOSIS — K449 Diaphragmatic hernia without obstruction or gangrene: Secondary | ICD-10-CM | POA: Diagnosis present

## 2017-03-22 DIAGNOSIS — R531 Weakness: Secondary | ICD-10-CM | POA: Diagnosis not present

## 2017-03-22 DIAGNOSIS — H9190 Unspecified hearing loss, unspecified ear: Secondary | ICD-10-CM | POA: Diagnosis present

## 2017-03-22 DIAGNOSIS — M6281 Muscle weakness (generalized): Secondary | ICD-10-CM | POA: Diagnosis not present

## 2017-03-22 DIAGNOSIS — Z96649 Presence of unspecified artificial hip joint: Secondary | ICD-10-CM | POA: Diagnosis present

## 2017-03-22 DIAGNOSIS — Z7982 Long term (current) use of aspirin: Secondary | ICD-10-CM

## 2017-03-22 DIAGNOSIS — K219 Gastro-esophageal reflux disease without esophagitis: Secondary | ICD-10-CM | POA: Diagnosis present

## 2017-03-22 DIAGNOSIS — S299XXA Unspecified injury of thorax, initial encounter: Secondary | ICD-10-CM | POA: Diagnosis not present

## 2017-03-22 DIAGNOSIS — B9620 Unspecified Escherichia coli [E. coli] as the cause of diseases classified elsewhere: Secondary | ICD-10-CM | POA: Diagnosis present

## 2017-03-22 DIAGNOSIS — R404 Transient alteration of awareness: Secondary | ICD-10-CM | POA: Diagnosis not present

## 2017-03-22 DIAGNOSIS — I110 Hypertensive heart disease with heart failure: Secondary | ICD-10-CM | POA: Diagnosis present

## 2017-03-22 DIAGNOSIS — E785 Hyperlipidemia, unspecified: Secondary | ICD-10-CM | POA: Diagnosis not present

## 2017-03-22 DIAGNOSIS — D696 Thrombocytopenia, unspecified: Secondary | ICD-10-CM | POA: Diagnosis present

## 2017-03-22 DIAGNOSIS — J189 Pneumonia, unspecified organism: Secondary | ICD-10-CM

## 2017-03-22 DIAGNOSIS — I509 Heart failure, unspecified: Secondary | ICD-10-CM

## 2017-03-22 LAB — CBC WITH DIFFERENTIAL/PLATELET
BASOS PCT: 0 %
Basophils Absolute: 0 10*3/uL (ref 0.0–0.1)
EOS ABS: 0 10*3/uL (ref 0.0–0.7)
Eosinophils Relative: 0 %
HCT: 36.4 % (ref 36.0–46.0)
HEMOGLOBIN: 12.7 g/dL (ref 12.0–15.0)
LYMPHS ABS: 0.5 10*3/uL — AB (ref 0.7–4.0)
Lymphocytes Relative: 3 %
MCH: 33.6 pg (ref 26.0–34.0)
MCHC: 34.9 g/dL (ref 30.0–36.0)
MCV: 96.3 fL (ref 78.0–100.0)
MONOS PCT: 5 %
Monocytes Absolute: 0.6 10*3/uL (ref 0.1–1.0)
NEUTROS PCT: 92 %
Neutro Abs: 13.2 10*3/uL — ABNORMAL HIGH (ref 1.7–7.7)
Platelets: 84 10*3/uL — ABNORMAL LOW (ref 150–400)
RBC: 3.78 MIL/uL — AB (ref 3.87–5.11)
RDW: 14.2 % (ref 11.5–15.5)
WBC: 14.3 10*3/uL — ABNORMAL HIGH (ref 4.0–10.5)

## 2017-03-22 LAB — COMPREHENSIVE METABOLIC PANEL
ALBUMIN: 2.8 g/dL — AB (ref 3.5–5.0)
ALK PHOS: 80 U/L (ref 38–126)
ALT: 23 U/L (ref 14–54)
ANION GAP: 9 (ref 5–15)
AST: 88 U/L — ABNORMAL HIGH (ref 15–41)
BILIRUBIN TOTAL: 2.9 mg/dL — AB (ref 0.3–1.2)
BUN: 20 mg/dL (ref 6–20)
CALCIUM: 8.8 mg/dL — AB (ref 8.9–10.3)
CO2: 22 mmol/L (ref 22–32)
CREATININE: 0.77 mg/dL (ref 0.44–1.00)
Chloride: 106 mmol/L (ref 101–111)
GFR calc Af Amer: >60 mL/min (ref >60)
GFR calc non Af Amer: >60 mL/min (ref >60)
GLUCOSE: 105 mg/dL — AB (ref 65–99)
Potassium: 3.5 mmol/L (ref 3.5–5.1)
SODIUM: 137 mmol/L (ref 135–145)
TOTAL PROTEIN: 6.6 g/dL (ref 6.5–8.1)

## 2017-03-22 LAB — URINALYSIS, ROUTINE W REFLEX MICROSCOPIC
Bilirubin Urine: NEGATIVE
Glucose, UA: NEGATIVE mg/dL
Ketones, ur: 5 mg/dL — AB
Nitrite: NEGATIVE
Protein, ur: 30 mg/dL — AB
Specific Gravity, Urine: 1.019 (ref 1.005–1.030)
pH: 6 (ref 5.0–8.0)

## 2017-03-22 LAB — I-STAT TROPONIN, ED: TROPONIN I, POC: 0.04 ng/mL (ref 0.00–0.08)

## 2017-03-22 LAB — LACTIC ACID, PLASMA
LACTIC ACID, VENOUS: 3.7 mmol/L — AB (ref 0.5–1.9)
Lactic Acid, Venous: 2.8 mmol/L (ref 0.5–1.9)
Lactic Acid, Venous: 2 mmol/L (ref 0.5–1.9)
Lactic Acid, Venous: 2.3 mmol/L (ref 0.5–1.9)

## 2017-03-22 LAB — BRAIN NATRIURETIC PEPTIDE: B Natriuretic Peptide: 663 pg/mL — ABNORMAL HIGH (ref 0.0–100.0)

## 2017-03-22 LAB — MRSA PCR SCREENING: MRSA BY PCR: NEGATIVE

## 2017-03-22 MED ORDER — VANCOMYCIN HCL IN DEXTROSE 1-5 GM/200ML-% IV SOLN
1000.0000 mg | Freq: Once | INTRAVENOUS | Status: DC
  Filled 2017-03-22: qty 200

## 2017-03-22 MED ORDER — SODIUM CHLORIDE 0.9 % IV BOLUS (SEPSIS)
500.0000 mL | Freq: Once | INTRAVENOUS | Status: AC
  Administered 2017-03-22: 500 mL via INTRAVENOUS

## 2017-03-22 MED ORDER — LEVOFLOXACIN IN D5W 750 MG/150ML IV SOLN
750.0000 mg | Freq: Once | INTRAVENOUS | Status: AC
Start: 2017-03-22 — End: 2017-03-22
  Administered 2017-03-22: 750 mg via INTRAVENOUS
  Filled 2017-03-22: qty 150

## 2017-03-22 MED ORDER — DEXTROSE 5 % IV SOLN
1.0000 g | Freq: Three times a day (TID) | INTRAVENOUS | Status: DC
  Administered 2017-03-22 – 2017-03-25 (×8): 1 g via INTRAVENOUS
  Filled 2017-03-22 (×12): qty 1

## 2017-03-22 MED ORDER — ONDANSETRON HCL 4 MG PO TABS: 4.0000 mg | ORAL_TABLET | Freq: Four times a day (QID) | ORAL | Status: DC | PRN

## 2017-03-22 MED ORDER — TRAMADOL HCL 50 MG PO TABS
50.0000 mg | ORAL_TABLET | Freq: Two times a day (BID) | ORAL | Status: DC | PRN
  Filled 2017-03-22: qty 1

## 2017-03-22 MED ORDER — ACETAMINOPHEN 325 MG PO TABS
650.0000 mg | ORAL_TABLET | Freq: Four times a day (QID) | ORAL | Status: DC | PRN
  Administered 2017-03-23: 650 mg via ORAL
  Filled 2017-03-22: qty 2

## 2017-03-22 MED ORDER — SODIUM CHLORIDE 0.9 % IV BOLUS (SEPSIS): 500.0000 mL | Freq: Once | INTRAVENOUS | Status: DC

## 2017-03-22 MED ORDER — ASPIRIN EC 325 MG PO TBEC
325.0000 mg | DELAYED_RELEASE_TABLET | Freq: Every day | ORAL | Status: DC
  Administered 2017-03-24 – 2017-03-27 (×4): 325 mg via ORAL
  Filled 2017-03-22 (×5): qty 1

## 2017-03-22 MED ORDER — SODIUM CHLORIDE 0.9 % IV SOLN
INTRAVENOUS | Status: DC
  Administered 2017-03-22 – 2017-03-23 (×2): via INTRAVENOUS

## 2017-03-22 MED ORDER — GABAPENTIN 100 MG PO CAPS
100.0000 mg | ORAL_CAPSULE | Freq: Two times a day (BID) | ORAL | Status: DC
  Administered 2017-03-22 – 2017-03-23 (×2): 100 mg via ORAL
  Filled 2017-03-22 (×3): qty 1

## 2017-03-22 MED ORDER — SODIUM CHLORIDE 0.9 % IV SOLN
1500.0000 mg | Freq: Once | INTRAVENOUS | Status: AC
  Administered 2017-03-22: 1500 mg via INTRAVENOUS
  Filled 2017-03-22: qty 1500

## 2017-03-22 MED ORDER — ONDANSETRON HCL 4 MG/2ML IJ SOLN: 4.0000 mg | Freq: Four times a day (QID) | INTRAMUSCULAR | Status: DC | PRN

## 2017-03-22 MED ORDER — ACETAMINOPHEN 650 MG RE SUPP: 650.0000 mg | Freq: Four times a day (QID) | RECTAL | Status: DC | PRN

## 2017-03-22 MED ORDER — ACETAMINOPHEN 650 MG RE SUPP
RECTAL | Status: AC
  Administered 2017-03-22: 650 mg
  Filled 2017-03-22: qty 1

## 2017-03-22 MED ORDER — VANCOMYCIN HCL IN DEXTROSE 750-5 MG/150ML-% IV SOLN
750.0000 mg | Freq: Two times a day (BID) | INTRAVENOUS | Status: DC
  Administered 2017-03-23: 750 mg via INTRAVENOUS
  Filled 2017-03-22 (×7): qty 150

## 2017-03-22 MED ORDER — AZTREONAM 2 G IJ SOLR
2.0000 g | Freq: Once | INTRAMUSCULAR | Status: AC
  Administered 2017-03-22: 2 g via INTRAVENOUS
  Filled 2017-03-22: qty 2

## 2017-03-22 MED ORDER — PRAVASTATIN SODIUM 40 MG PO TABS
40.0000 mg | ORAL_TABLET | Freq: Every day | ORAL | Status: DC
  Administered 2017-03-22 – 2017-03-26 (×5): 40 mg via ORAL
  Filled 2017-03-22 (×5): qty 1

## 2017-03-22 MED ORDER — SODIUM CHLORIDE 0.9 % IV BOLUS (SEPSIS)
500.0000 mL | Freq: Once | INTRAVENOUS | Status: AC
  Administered 2017-03-23: 500 mL via INTRAVENOUS

## 2017-03-22 MED ORDER — LEVOFLOXACIN IN D5W 750 MG/150ML IV SOLN: 750.0000 mg | INTRAVENOUS | Status: DC

## 2017-03-22 MED ORDER — SODIUM CHLORIDE 0.9 % IV BOLUS (SEPSIS)
1000.0000 mL | Freq: Once | INTRAVENOUS | Status: AC
  Administered 2017-03-22: 1000 mL via INTRAVENOUS

## 2017-03-22 MED ORDER — POLYETHYLENE GLYCOL 3350 17 G PO PACK
17.0000 g | PACK | Freq: Every day | ORAL | Status: DC
  Administered 2017-03-22 – 2017-03-27 (×4): 17 g via ORAL
  Filled 2017-03-22 (×5): qty 1

## 2017-03-22 NOTE — ED Triage Notes (Addendum)
Pt was brought in by EMS due to fall. Pt reports she was getting up to fix breakfast and fell. Denies LOC. Reports some pain lower back. Has bruises to bilateral elbows. Answers questions appropriately, but very HOH . Family reports fever and chills yesterday

## 2017-03-22 NOTE — Progress Notes (Signed)
Pharmacy Antibiotic Note  [name] is a 83 y.o. female admitted on 03/22/2017 with sepsis.  Pharmacy has been consulted for Vancomycin, Aztreonam, and Levaquin dosing.  Plan: Vancomycin 1500mg  loading dose then 750mg  IV every 12 hours.  Goal trough 15-20 mcg/mL.  Aztreonam 2gmLD, then 1gm IV q8h Levaquin 750mg  IV q24h f/u cxs and clinical progress Monitor V/S, labs, and levels as indicated  Height: 5' 8.9" (175 cm) Weight: 161 lb (73 kg) IBW/kg (Calculated) : 65.97  Temp (24hrs), Avg:100.1 F (37.8 C), Min:98.9 F (37.2 C), Max:101.1 F (38.4 C)   Recent Labs Lab 03/22/17 1156  WBC 14.3*  CREATININE 0.77  LATICACIDVEN 2.8*    Estimated Creatinine Clearance: 50.6 mL/min (by C-G formula based on SCr of 0.77 mg/dL).    Allergies  Allergen Reactions  . Sulfa Antibiotics Anaphylaxis  . Cephalexin Itching and Rash    Antimicrobials this admission: Vancomycin 4/26 >>  Aztreonam 4/26 >> Levaquin 4/26 >>   Dose adjustments this admission: N/A Microbiology results: 4/26 BCx: pending 4/26 Sputum: pending 4/25 MRSA PCR: pending  Thank you for allowing pharmacy to be a part of this patient's care.  Elder Cyphers, BS Loura Back, [place] Clinical Pharmacist Pager [phone] 03/22/2017 3:34 PM

## 2017-03-22 NOTE — ED Notes (Signed)
CRITICAL VALUE ALERT  Critical value received:  Lactic acid 2.8  Date of notification:  03/22/17  Time of notification:  1240  Critical value read back:Yes.    Nurse who received alert:  RMinter, RN  MD notified (1st page):  Dr. Jacqulyn Bath  Time of first page:  1242  MD notified (2nd page):  Time of second page:  Responding MD:  Dr. Jacqulyn Bath  Time MD responded:  [phone]

## 2017-03-22 NOTE — ED Provider Notes (Signed)
Emergency Department Provider Note  By signing my name below, I, Bobbie Stack, attest that this documentation has been prepared under the direction and in the presence of Maia Plan, MD. Electronically Signed: Bobbie Stack, Scribe. 03/22/17. 12:19 PM.   I have reviewed the triage vital signs and the nursing notes.   HISTORY  Chief Complaint Fall and Fever    HPI Comments: [name] is a 83 y.o. female brought in by ambulance, who presents to the Emergency Department s/p fall that occurred earlier today. Per Family: The patient was coming from the bathroom when she fell. They do not believe she hit her head on anything. The patient had fever/chills yesterday. The patient has not ate much today. She is not on any blood thinners. The patient reports some pain in her lower back. The patient has been seen several times in the past for dysuria and was given cipro. The patient doesn't typically drink water. She mostly drinks juice and soft drinks on occasion. She denies chest pain or SOB.  Past Medical History:  Diagnosis Date  . GERD (gastroesophageal reflux disease)   . Hyperlipidemia   . Hypertension   . Neuromuscular disorder [facility])     Patient Active Problem List   Diagnosis Date Noted  . Sepsis (HCC) 03/22/2017  . Acute lower UTI 03/22/2017  . Community acquired pneumonia 03/22/2017  . Hip fracture (HCC) 07/16/2015  . Aortic heart murmur 07/16/2015  . Chronic CHF (HCC) 07/16/2015  . Essential hypertension 07/16/2015  . Neuropathy 07/16/2015  . Abdominal aortic aneurysm (HCC) 06/17/2012  . Aftercare following surgery of the circulatory system, NEC 06/17/2012    Past Surgical History:  Procedure Laterality Date  . ABDOMINAL AORTIC ANEURYSM REPAIR    . BREAST BIOPSY    . CHOLECYSTECTOMY    . INGUINAL HERNIA REPAIR    . TOTAL HIP ARTHROPLASTY        Allergies Sulfa antibiotics and Cephalexin  Family History  Problem Relation Age of Onset  . Heart  disease Father     Social History Social History  Substance Use Topics  . Smoking status: Former Smoker    Packs/day: 0.50    Types: Cigarettes    Quit date: 11/28/1991  . Smokeless tobacco: Never Used  . Alcohol use No    Review of Systems Constitutional: Positive for fever/chills Eyes: No visual changes. ENT: No sore throat. Cardiovascular: Denies chest pain. Respiratory: Denies shortness of breath. Gastrointestinal: No abdominal pain.  No nausea, no vomiting.  No diarrhea.  No constipation. Genitourinary: Positive for dysuria. Musculoskeletal: Positive for lower back pain. Skin: Negative for rash. Neurological: Negative for headaches, focal weakness or numbness.  10-point ROS otherwise negative.  ____________________________________________   PHYSICAL EXAM:  VITAL SIGNS: ED Triage Vitals  Enc Vitals Group     BP 03/22/17 1129 (!) 146/58     Pulse Rate 03/22/17 1125 100     Resp 03/22/17 1125 14     Temp 03/22/17 1125 99.4 F (37.4 C)     Temp Source 03/22/17 1125 Oral     SpO2 03/22/17 1125 93 %     Weight 03/22/17 1125 161 lb (73 kg)     Pain Score 03/22/17 1124 2   Constitutional: Alert. No acute distress. Hard of hearing.  Eyes: Conjunctivae are normal. PERRL. Head: Atraumatic. Nose: No congestion/rhinnorhea. Mouth/Throat: Mucous membranes are dry.  Neck: No stridor.   Cardiovascular: Tachycardia. Good peripheral circulation. Grossly normal heart sounds.   Respiratory: Normal respiratory  effort.  No retractions. Lungs CTAB. Gastrointestinal: Soft and nontender. No distention.  Musculoskeletal: No lower extremity tenderness nor edema. No gross deformities of extremities. Neurologic:  Normal speech and language. No gross focal neurologic deficits are appreciated.  Skin:  Skin is warm, dry and intact. No rash noted.   ____________________________________________   LABS (all labs ordered are listed, but only abnormal results are displayed)  Labs  Reviewed  URINALYSIS, ROUTINE W REFLEX MICROSCOPIC - Abnormal; Notable for the following:       Result Value   Color, Urine AMBER (*)    APPearance CLOUDY (*)    Hgb urine dipstick MODERATE (*)    Ketones, ur 5 (*)    Protein, ur 30 (*)    Leukocytes, UA SMALL (*)    Bacteria, UA RARE (*)    Squamous Epithelial / LPF 0-5 (*)    All other components within normal limits  LACTIC ACID, PLASMA - Abnormal; Notable for the following:    Lactic Acid, Venous 2.8 (*)    All other components within normal limits  LACTIC ACID, PLASMA - Abnormal; Notable for the following:    Lactic Acid, Venous 2.0 (*)    All other components within normal limits  CBC WITH DIFFERENTIAL/PLATELET - Abnormal; Notable for the following:    WBC 14.3 (*)    RBC 3.78 (*)    Platelets 84 (*)    Neutro Abs 13.2 (*)    Lymphs Abs 0.5 (*)    All other components within normal limits  COMPREHENSIVE METABOLIC PANEL - Abnormal; Notable for the following:    Glucose, Bld 105 (*)    Calcium 8.8 (*)    Albumin 2.8 (*)    AST 88 (*)    Total Bilirubin 2.9 (*)    All other components within normal limits  CULTURE, BLOOD (ROUTINE X 2)  CULTURE, BLOOD (ROUTINE X 2)  URINE CULTURE  MRSA PCR SCREENING  CULTURE, EXPECTORATED SPUTUM-ASSESSMENT  LACTIC ACID, PLASMA  LACTIC ACID, PLASMA  BRAIN NATRIURETIC PEPTIDE  I-STAT TROPOININ, ED   ____________________________________________  EKG   EKG Interpretation  Date/Time:  Thursday March 22 2017 12:14:19 EDT Ventricular Rate:  93 PR Interval:    QRS Duration: 83 QT Interval:  365 QTC Calculation: 454 R Axis:   -10 Text Interpretation:  Sinus rhythm No STEMI.  Confirmed by Elberta Lachapelle MD, [place] [phone]) on 03/22/2017 12:56:40 PM       ____________________________________________  RADIOLOGY  Dg Chest 2 View  Result Date: 03/22/2017 CLINICAL DATA:  83 year old female with thoracic spine pain after falling earlier today. Additionally, patient has fever and chills. EXAM:  CHEST  2 VIEW COMPARISON:  Prior chest x-ray 07/15/2015 FINDINGS: Stable cardiomegaly. Atherosclerotic calcifications present in the transverse aorta. Patchy airspace opacity in the lingula obscures the cardiac margin. Diffuse mild interstitial prominence with peripheral Kerley B-lines suggests chronic CHF. Overall, the degree of pulmonary vascular congestion is less than seen on prior chest x-rays. Extremely limited visualization of the thoracic spine secondary to diffuse demineralization of the osseous structures. No pneumothorax or pleural effusion. Mild lingular atelectasis. IMPRESSION: 1. Patchy airspace opacity in the lingula concerning for bronchopneumonia given the clinical history of fever and chills. Followup PA and lateral chest X-ray is recommended in 3-4 weeks following trial of antibiotic therapy to ensure resolution and exclude underlying malignancy. 2. Stable cardiomegaly and aortic atherosclerosis. Aortic Atherosclerosis (ICD10-170.0) 3. Diffuse mild interstitial prominence likely representing mild chronic CHF. The degree of vascular congestion is less than seen  on 07/15/2015. 4. Limited evaluation of the osseous structures secondary to advanced demineralization. No definite fracture. Electronically Signed   By: Malachy Moan M.D.   On: 03/22/2017 13:27    ____________________________________________   PROCEDURES  Procedure(s) performed:   Procedures  CRITICAL CARE Performed by: Maia Plan Total critical care time: 30 minutes Critical care time was exclusive of separately billable procedures and treating other patients. Critical care was necessary to treat or prevent imminent or life-threatening deterioration. Critical care was time spent personally by me on the following activities: development of treatment plan with patient and/or surrogate as well as nursing, discussions with consultants, evaluation of patient's response to treatment, examination of patient, obtaining  history from patient or surrogate, ordering and performing treatments and interventions, ordering and review of laboratory studies, ordering and review of radiographic studies, pulse oximetry and re-evaluation of patient's condition.  Alona Bene, MD Emergency Medicine  ____________________________________________   INITIAL IMPRESSION / ASSESSMENT AND PLAN / ED COURSE  Pertinent labs & imaging results that were available during my care of the patient were reviewed by me and considered in my medical decision making (see chart for details).  Patient arrives to the emergency department for evaluation of shaking chills, fever, dysuria, cough, and mechanical fall today. No head trauma. Patient is awake and alert although hard of hearing. Plan for sepsis labs and reassess.   12:40 PM Lactate elevated with fever in the ED. Will begin broad specturm abx with unknown infection source at this time. IVF given. Family and patient updated. Normal BP. No evidence of severe sepsis or septic shock.   Patient with evidence of PNA and possible UTI. Now requiring 2L [place] for mild SOB. Plan for admission for abx and enzymes trending.   Discussed patient's case with hospitalist  Patient and family (if present) updated with plan. Care transferred to hospitalist service.  I reviewed all nursing notes, vitals, pertinent old records, EKGs, labs, imaging (as available). ____________________________________________  FINAL CLINICAL IMPRESSION(S) / ED DIAGNOSES  Final diagnoses:  Acute cystitis without hematuria  Sepsis, due to unspecified organism [facility])     MEDICATIONS GIVEN DURING THIS VISIT:  Medications  vancomycin (VANCOCIN) 1,500 mg in sodium chloride 0.9 % 500 mL IVPB (1,500 mg Intravenous New Bag/Given 03/22/17 1510)  aspirin EC tablet 325 mg (not administered)  polyethylene glycol (MIRALAX / GLYCOLAX) packet 17 g (not administered)  traMADol (ULTRAM) tablet 50 mg (not administered)  pravastatin  (PRAVACHOL) tablet 40 mg (not administered)  gabapentin (NEURONTIN) capsule 100 mg (not administered)  0.9 %  sodium chloride infusion (not administered)  acetaminophen (TYLENOL) tablet 650 mg (not administered)    Or  acetaminophen (TYLENOL) suppository 650 mg (not administered)  ondansetron (ZOFRAN) tablet 4 mg (not administered)    Or  ondansetron (ZOFRAN) injection 4 mg (not administered)  sodium chloride 0.9 % bolus 500 mL (not administered)  vancomycin (VANCOCIN) IVPB 750 mg/150 ml premix (not administered)  aztreonam (AZACTAM) 1 g in dextrose 5 % 50 mL IVPB (not administered)  levofloxacin (LEVAQUIN) IVPB 750 mg (not administered)  sodium chloride 0.9 % bolus 500 mL (0 mLs Intravenous Stopped 03/22/17 1319)  levofloxacin (LEVAQUIN) IVPB 750 mg (0 mg Intravenous Stopped 03/22/17 1509)  aztreonam (AZACTAM) 2 g in dextrose 5 % 50 mL IVPB (0 g Intravenous Stopped 03/22/17 1500)  acetaminophen (TYLENOL) 650 MG suppository (650 mg  Given 03/22/17 1327)     NEW OUTPATIENT MEDICATIONS STARTED DURING THIS VISIT:  None   Note:  This document  was prepared using Conservation officer, historic buildings and may include unintentional dictation errors.  Alona Bene, MD Emergency Medicine  I personally performed the services described in this documentation, which was scribed in my presence. The recorded information has been reviewed and is accurate.      Maia Plan, MD 03/22/17 1919

## 2017-03-22 NOTE — H&P (Signed)
1550 Spoke with Ginger with [facility] regarding PICC line order for patient. PICC line order placed, waiting on ETA at this time.

## 2017-03-22 NOTE — Progress Notes (Signed)
1600 Ginger from [facility] called to report that [name], RN would be here around 5:15pm for PICC line placement.

## 2017-03-22 NOTE — H&P (Addendum)
History and Physical    [name] YFR:[id number] DOB: Feb 23, 1927 DOA: 03/22/2017  PCP: Dwana Melena, MD  Patient coming from: Home   I have personally briefly reviewed patient's old medical records in [facility]  Chief Complaint: fall, fever.   HPI: [name] is a 83 y.o. female with medical history significant of HTN, HLD, neuropathy, AAA, who presents with fever, chills, dysuria. Patient is hard of hearing. She is slowly answering questions. She denies pain. History obtain from son, he found her on the floor. Her head was on a soft couch. Patient denies any pain, dyspnea. Patient has been notice to be coughing. She has been complaining of dysuria.   ED Course: Patinet found to be febrile tempeture at 101, lactic acid 2.8, WBC 14, UA with too numerous to count WBC, Chest x ray: Patchy airspace opacity in the lingula concerning for bronchopneumonia given the clinical history of fever and chills. Followup PA and lateral chest X-ray is recommended in 3-4 weeks. following trial of antibiotic therapy to ensure resolution and exclude underlying malignancy.Diffuse mild interstitial prominence likely representing mild chronic CHF.  Review of Systems: As per HPI otherwise 10 point review of systems negative.    Past Medical History:  Diagnosis Date  . GERD (gastroesophageal reflux disease)   . Hyperlipidemia   . Hypertension   . Neuromuscular disorder [facility])     Past Surgical History:  Procedure Laterality Date  . ABDOMINAL AORTIC ANEURYSM REPAIR    . BREAST BIOPSY    . CHOLECYSTECTOMY    . INGUINAL HERNIA REPAIR    . TOTAL HIP ARTHROPLASTY       reports that she quit smoking about 25 years ago. Her smoking use included Cigarettes. She smoked 0.50 packs per day. She has never used smokeless tobacco. She reports that she does not drink alcohol or use drugs.  Allergies  Allergen Reactions  . Sulfa Antibiotics Anaphylaxis  . Cephalexin Itching and Rash    Family History    Problem Relation Age of Onset  . Heart disease Father      Prior to Admission medications   Medication Sig Start Date End Date Taking? Authorizing Provider  gabapentin (NEURONTIN) 100 MG capsule Take 100-300 mg by mouth 3 (three) times daily.    Yes Historical Provider, MD  pravastatin (PRAVACHOL) 40 MG tablet Take 40 mg by mouth at bedtime.   Yes Historical Provider, MD  aspirin EC 325 MG EC tablet Take 1 tablet (325 mg total) by mouth daily with breakfast. 07/18/15   Kathryne Hitch, MD  HYDROcodone-acetaminophen (NORCO/VICODIN) 5-325 MG per tablet Take 1 tablet by mouth every 6 (six) hours as needed for moderate pain. 07/20/15   Ripudeep Jenna Luo, MD  methocarbamol (ROBAXIN) 500 MG tablet Take 1 tablet (500 mg total) by mouth every 6 (six) hours as needed for muscle spasms. 07/20/15   Ripudeep Jenna Luo, MD  polyethylene glycol (MIRALAX / GLYCOLAX) packet Take 17 g by mouth daily. 07/20/15   Ripudeep Jenna Luo, MD  traMADol (ULTRAM) 50 MG tablet Take 1 tablet (50 mg total) by mouth every 12 (twelve) hours as needed for moderate pain. 07/20/15   Ripudeep Jenna Luo, MD    Physical Exam: Vitals:   03/22/17 1330 03/22/17 1400 03/22/17 1430 03/22/17 1511  BP: (!) 152/85 (!) 98/46 103/89 (!) 92/47  Pulse: (!) 101 94 94 87  Resp: (!) 25 (!) 25 (!) 25 (!) 26  Temp:    98.9 F (37.2 C)  TempSrc:    Oral  SpO2: 100% 96% 97% 95%  Weight:      Height:    5' 8.9" (1.75 m)    Constitutional: NAD, calm, comfortable, sluggish.  Vitals:   03/22/17 1330 03/22/17 1400 03/22/17 1430 03/22/17 1511  BP: (!) 152/85 (!) 98/46 103/89 (!) 92/47  Pulse: (!) 101 94 94 87  Resp: (!) 25 (!) 25 (!) 25 (!) 26  Temp:    98.9 F (37.2 C)  TempSrc:    Oral  SpO2: 100% 96% 97% 95%  Weight:      Height:    5' 8.9" (1.75 m)   Eyes: PERRL, lids and conjunctivae normal ENMT: Mucous membranes are moist. Posterior pharynx clear of any exudate or lesions.Normal dentition.  Neck: normal, supple, no masses, no  thyromegaly Respiratory:  no wheezing few ronchus,. Normal respiratory effort. No accessory muscle use.  Cardiovascular: Regular rate and rhythm, systolic  murmurs / rubs / gallops. No extremity edema. 2+ pedal pulses. No carotid bruits.  Abdomen: no tenderness, no masses palpated. No hepatosplenomegaly. Bowel sounds positive.  Musculoskeletal: no clubbing / cyanosis. No joint deformity upper and lower extremities. Good ROM, no contractures. Normal muscle tone.  Skin: no rashes, lesions, ulcers. No induration Neurologic: sluggish, CN 2-12 grossly intact. Sensation intact, DTR normal. Strength 5/5 in all 4.  Psychiatric: normal affect    Labs on Admission: I have personally reviewed following labs and imaging studies  CBC:  Recent Labs Lab 03/22/17 1156  WBC 14.3*  NEUTROABS 13.2*  HGB 12.7  HCT 36.4  MCV 96.3  PLT 84*   Basic Metabolic Panel:  Recent Labs Lab 03/22/17 1156  NA 137  K 3.5  CL 106  CO2 22  GLUCOSE 105*  BUN 20  CREATININE 0.77  CALCIUM 8.8*   GFR: Estimated Creatinine Clearance: 50.6 mL/min (by C-G formula based on SCr of 0.77 mg/dL). Liver Function Tests:  Recent Labs Lab 03/22/17 1156  AST 88*  ALT 23  ALKPHOS 80  BILITOT 2.9*  PROT 6.6  ALBUMIN 2.8*   No results for input(s): LIPASE, AMYLASE in the last 168 hours. No results for input(s): AMMONIA in the last 168 hours. Coagulation Profile: No results for input(s): INR, PROTIME in the last 168 hours. Cardiac Enzymes: No results for input(s): CKTOTAL, CKMB, CKMBINDEX, TROPONINI in the last 168 hours. BNP (last 3 results) No results for input(s): PROBNP in the last 8760 hours. HbA1C: No results for input(s): HGBA1C in the last 72 hours. CBG: No results for input(s): GLUCAP in the last 168 hours. Lipid Profile: No results for input(s): CHOL, HDL, LDLCALC, TRIG, CHOLHDL, LDLDIRECT in the last 72 hours. Thyroid Function Tests: No results for input(s): TSH, T4TOTAL, FREET4, T3FREE,  THYROIDAB in the last 72 hours. Anemia Panel: No results for input(s): VITAMINB12, FOLATE, FERRITIN, TIBC, IRON, RETICCTPCT in the last 72 hours. Urine analysis:    Component Value Date/Time   COLORURINE AMBER (A) 03/22/2017 1143   APPEARANCEUR CLOUDY (A) 03/22/2017 1143   LABSPEC 1.019 03/22/2017 1143   PHURINE 6.0 03/22/2017 1143   GLUCOSEU NEGATIVE 03/22/2017 1143   HGBUR MODERATE (A) 03/22/2017 1143   BILIRUBINUR NEGATIVE 03/22/2017 1143   KETONESUR 5 (A) 03/22/2017 1143   PROTEINUR 30 (A) 03/22/2017 1143   UROBILINOGEN 2.0 (H) 07/19/2015 1045   NITRITE NEGATIVE 03/22/2017 1143   LEUKOCYTESUR SMALL (A) 03/22/2017 1143    Radiological Exams on Admission: Dg Chest 2 View  Result Date: 03/22/2017 CLINICAL DATA:  83 year old female with  thoracic spine pain after falling earlier today. Additionally, patient has fever and chills. EXAM: CHEST  2 VIEW COMPARISON:  Prior chest x-ray 07/15/2015 FINDINGS: Stable cardiomegaly. Atherosclerotic calcifications present in the transverse aorta. Patchy airspace opacity in the lingula obscures the cardiac margin. Diffuse mild interstitial prominence with peripheral Kerley B-lines suggests chronic CHF. Overall, the degree of pulmonary vascular congestion is less than seen on prior chest x-rays. Extremely limited visualization of the thoracic spine secondary to diffuse demineralization of the osseous structures. No pneumothorax or pleural effusion. Mild lingular atelectasis. IMPRESSION: 1. Patchy airspace opacity in the lingula concerning for bronchopneumonia given the clinical history of fever and chills. Followup PA and lateral chest X-ray is recommended in 3-4 weeks following trial of antibiotic therapy to ensure resolution and exclude underlying malignancy. 2. Stable cardiomegaly and aortic atherosclerosis. Aortic Atherosclerosis (ICD10-170.0) 3. Diffuse mild interstitial prominence likely representing mild chronic CHF. The degree of vascular congestion is  less than seen on 07/15/2015. 4. Limited evaluation of the osseous structures secondary to advanced demineralization. No definite fracture. Electronically Signed   By: Malachy Moan M.D.   On: 03/22/2017 13:27    EKG: Independently reviewed. Sinus rhythm.   Assessment/Plan Active Problems:   Chronic CHF (HCC)   Essential hypertension   Neuropathy   Sepsis (HCC)   Acute lower UTI   Community acquired pneumonia  1-Sepsis; Secondary to UTI, PNA.  Presents with fevers, leukocytosis, UA with too numerous to count WBC. Chest x ray; air space opacity in the lingula.  Continue with broad spectrum antibiotics, IV vancomycin, Levaquin and Aztreonam..  PIC line placement. In case required pressors.  IV fluids.  Blood culture, urine culture.  Follow lactic acid.   2-Neuropathy; continue with gabapentin.   3-HTN; hold lasix, lisinopril.   4-Chronic Diastolic HF;  Hold lasix in setting of lasix.   5-Transaminases; repeat labs in am.   6-Acute hypoxic respiratory failure.  In setting of PNA.  Careful with fluids in setting of back ground of HF.  Incentive spirometry, IV antibiotics.   DVT prophylaxis: SCD for now due to thrombocytopenia.  Code Status: DNR Family Communication: sons at bedside.  Disposition Plan: might need rehab Consults called: none Admission status: inpatient , step down.    Alba Cory MD [facility] Pager [phone]  If 7PM-7AM, please contact night-coverage [email or web address] Password [facility]  03/22/2017, 3:36 PM

## 2017-03-22 NOTE — Progress Notes (Signed)
This RN received a critical Lactic Acid Value of 2.3 -primary made aware

## 2017-03-22 NOTE — ED Notes (Signed)
Patient wants something to eat at this time. MD notified states he would like patient to wait till all results are back. Patient and family informed.

## 2017-03-22 NOTE — ED Notes (Signed)
Patient transported to X-ray 

## 2017-03-23 DIAGNOSIS — N390 Urinary tract infection, site not specified: Secondary | ICD-10-CM

## 2017-03-23 LAB — BASIC METABOLIC PANEL
ANION GAP: 7 (ref 5–15)
BUN: 30 mg/dL — ABNORMAL HIGH (ref 6–20)
CHLORIDE: 113 mmol/L — AB (ref 101–111)
CO2: 20 mmol/L — AB (ref 22–32)
Calcium: 8.1 mg/dL — ABNORMAL LOW (ref 8.9–10.3)
Creatinine, Ser: 0.82 mg/dL (ref 0.44–1.00)
GFR calc Af Amer: >60 mL/min (ref >60)
GLUCOSE: 104 mg/dL — AB (ref 65–99)
POTASSIUM: 3.8 mmol/L (ref 3.5–5.1)
Sodium: 140 mmol/L (ref 135–145)

## 2017-03-23 LAB — HEPATIC FUNCTION PANEL
ALBUMIN: 2.2 g/dL — AB (ref 3.5–5.0)
ALT: 25 U/L (ref 14–54)
AST: 92 U/L — ABNORMAL HIGH (ref 15–41)
Alkaline Phosphatase: 58 U/L (ref 38–126)
Bilirubin, Direct: 0.5 mg/dL (ref 0.1–0.5)
Indirect Bilirubin: 1.3 mg/dL — ABNORMAL HIGH (ref 0.3–0.9)
TOTAL PROTEIN: 5.3 g/dL — AB (ref 6.5–8.1)
Total Bilirubin: 1.8 mg/dL — ABNORMAL HIGH (ref 0.3–1.2)

## 2017-03-23 LAB — CBC
HEMATOCRIT: 33.5 % — AB (ref 36.0–46.0)
HEMOGLOBIN: 11.5 g/dL — AB (ref 12.0–15.0)
MCH: 33.5 pg (ref 26.0–34.0)
MCHC: 34.3 g/dL (ref 30.0–36.0)
MCV: 97.7 fL (ref 78.0–100.0)
Platelets: 80 10*3/uL — ABNORMAL LOW (ref 150–400)
RBC: 3.43 MIL/uL — AB (ref 3.87–5.11)
RDW: 14.6 % (ref 11.5–15.5)
WBC: 6.9 10*3/uL (ref 4.0–10.5)

## 2017-03-23 MED ORDER — VANCOMYCIN HCL IN DEXTROSE 1-5 GM/200ML-% IV SOLN: 1000.0000 mg | INTRAVENOUS | Status: DC

## 2017-03-23 MED ORDER — ENSURE ENLIVE PO LIQD
237.0000 mL | Freq: Two times a day (BID) | ORAL | Status: DC
  Administered 2017-03-24 – 2017-03-26 (×4): 237 mL via ORAL

## 2017-03-23 MED ORDER — HYDRALAZINE HCL 20 MG/ML IJ SOLN
10.0000 mg | Freq: Four times a day (QID) | INTRAMUSCULAR | Status: DC | PRN
  Administered 2017-03-23 – 2017-03-24 (×2): 10 mg via INTRAVENOUS
  Filled 2017-03-23 (×2): qty 1

## 2017-03-23 MED ORDER — PANTOPRAZOLE SODIUM 40 MG PO TBEC
40.0000 mg | DELAYED_RELEASE_TABLET | Freq: Two times a day (BID) | ORAL | Status: DC
  Administered 2017-03-23 – 2017-03-27 (×8): 40 mg via ORAL
  Filled 2017-03-23 (×8): qty 1

## 2017-03-23 MED ORDER — LEVOFLOXACIN IN D5W 750 MG/150ML IV SOLN
750.0000 mg | INTRAVENOUS | Status: DC
  Administered 2017-03-23: 750 mg via INTRAVENOUS
  Filled 2017-03-23: qty 150

## 2017-03-23 NOTE — Progress Notes (Signed)
PROGRESS NOTE    [name]  ZOX:[id number] DOB: Jul 07, 1927 DOA: 03/22/2017 PCP: Dwana Melena, MD    Brief Narrative: [name] is a 83 y.o. female with medical history significant of HTN, HLD, neuropathy, AAA, who presents with fever, chills, dysuria. Patient is hard of hearing. She is slowly answering questions. She denies pain. History obtain from son, he found her on the floor. Her head was on a soft couch. Patient denies any pain, dyspnea. Patient has been notice to be coughing. She has been complaining of dysuria.   ED Course: Patinet found to be febrile tempeture at 101, lactic acid 2.8, WBC 14, UA with too numerous to count WBC, Chest x ray: Patchy airspace opacity in the lingula concerning for bronchopneumonia given the clinical history of fever and chills. Followup PA and lateral chest X-ray is recommended in 3-4 weeks. following trial of antibiotic therapy to ensure resolution and exclude underlying malignancy.Diffuse mild interstitial prominence likely representing mild chronic CHF.   Assessment & Plan:   Active Problems:   Chronic CHF (HCC)   Essential hypertension   Neuropathy   Sepsis (HCC)   Acute lower UTI   Community acquired pneumonia   1-1-Sepsis; Secondary to UTI, PNA.  Presents with fevers, leukocytosis, UA with too numerous to count WBC. Chest x ray; air space opacity in the lingula.  Continue with IV Levaquin and Aztreonam.. Discontinue vancomycin. MRSA PCR negative IV fluids.  Follows Blood culture, urine culture.  Lactic acid trending down.   2-Neuropathy; continue with gabapentin.   3-HTN; hold lasix, lisinopril.   4-Chronic Diastolic HF;  Hold lasix in setting of sepsis.   5-Transaminases;mildly increase AST.  check hepatitis panel.   6-Acute hypoxic respiratory failure.  In setting of PNA.  Incentive spirometry, IV antibiotics.   7-Thrombocytopenia; chronic.  Follow trend.   8-Dysphagia;  Difficulty swallowing solid.  Check  esophagogram.  Speech swallow evaluation.  Clear diet   DVT prophylaxis: SCD, due to thrombocytopenia.  Code Status: DNR Family Communication: Son who was at bedside.  Disposition Plan: to be determine.  Consultants:   none   Procedures: none  Antimicrobials:  Levaquin, aztreonam.    Subjective: Patient is more alert today, conversant.  She is feeling better.  breathing better.  Having trouble swallowing, food doesn't go down. She has had this problem for a while.   Objective: Vitals:   03/23/17 0500 03/23/17 0600 03/23/17 0700 03/23/17 0759  BP: (!) 111/52 134/62 121/81   Pulse: 81 83 75   Resp: 19 13 20    Temp:    99.5 F (37.5 C)  TempSrc:    Oral  SpO2: 93% 95% 94%   Weight: 77.8 kg (171 lb 8.3 oz)     Height:        Intake/Output Summary (Last 24 hours) at 03/23/17 1317 Last data filed at 03/23/17 0500  Gross per 24 hour  Intake             1050 ml  Output              813 ml  Net              237 ml   Filed Weights   03/22/17 1125 03/22/17 1552 03/23/17 0500  Weight: 73 kg (161 lb) 77.8 kg (171 lb 8.3 oz) 77.8 kg (171 lb 8.3 oz)    Examination:  General exam: Appears calm and comfortable  Respiratory system: Clear to auscultation. Respiratory effort normal. Cardiovascular system: S1 &  S2 heard, RRR. No JVD, murmurs, rubs, gallops or clicks. No pedal edema. Gastrointestinal system: Abdomen is nondistended, soft and nontender. No organomegaly or masses felt. Normal bowel sounds heard. Central nervous system: Alert and oriented. No focal neurological deficits. Extremities: Symmetric 5 x 5 power. Skin: No rashes, lesions or ulcers Psychiatry: Judgement and insight appear normal. Mood & affect appropriate.     Data Reviewed: I have personally reviewed following labs and imaging studies  CBC:  Recent Labs Lab 03/22/17 1156 03/23/17 0412  WBC 14.3* 6.9  NEUTROABS 13.2*  --   HGB 12.7 11.5*  HCT 36.4 33.5*  MCV 96.3 97.7  PLT 84* 80*    Basic Metabolic Panel:  Recent Labs Lab 03/22/17 1156 03/23/17 0412  NA 137 140  K 3.5 3.8  CL 106 113*  CO2 22 20*  GLUCOSE 105* 104*  BUN 20 30*  CREATININE 0.77 0.82  CALCIUM 8.8* 8.1*   GFR: Estimated Creatinine Clearance: 49.6 mL/min (by C-G formula based on SCr of 0.82 mg/dL). Liver Function Tests:  Recent Labs Lab 03/22/17 1156 03/23/17 0412  AST 88* 92*  ALT 23 25  ALKPHOS 80 58  BILITOT 2.9* 1.8*  PROT 6.6 5.3*  ALBUMIN 2.8* 2.2*   No results for input(s): LIPASE, AMYLASE in the last 168 hours. No results for input(s): AMMONIA in the last 168 hours. Coagulation Profile: No results for input(s): INR, PROTIME in the last 168 hours. Cardiac Enzymes: No results for input(s): CKTOTAL, CKMB, CKMBINDEX, TROPONINI in the last 168 hours. BNP (last 3 results) No results for input(s): PROBNP in the last 8760 hours. HbA1C: No results for input(s): HGBA1C in the last 72 hours. CBG: No results for input(s): GLUCAP in the last 168 hours. Lipid Profile: No results for input(s): CHOL, HDL, LDLCALC, TRIG, CHOLHDL, LDLDIRECT in the last 72 hours. Thyroid Function Tests: No results for input(s): TSH, T4TOTAL, FREET4, T3FREE, THYROIDAB in the last 72 hours. Anemia Panel: No results for input(s): VITAMINB12, FOLATE, FERRITIN, TIBC, IRON, RETICCTPCT in the last 72 hours. Sepsis Labs:  Recent Labs Lab 03/22/17 1156 03/22/17 1449 03/22/17 1827 03/22/17 2055  LATICACIDVEN 2.8* 2.0* 3.7* 2.3*    Recent Results (from the past 240 hour(s))  Culture, blood (routine x 2)     Status: None (Preliminary result)   Collection Time: 03/22/17 11:56 AM  Result Value Ref Range Status   Specimen Description BLOOD RIGHT ARM  Final   Special Requests   Final    BOTTLES DRAWN AEROBIC AND ANAEROBIC Blood Culture adequate volume   Culture NO GROWTH < 24 HOURS  Final   Report Status PENDING  Incomplete  Culture, blood (routine x 2)     Status: None (Preliminary result)   Collection  Time: 03/22/17 12:14 PM  Result Value Ref Range Status   Specimen Description BLOOD LEFT ANTECUBITAL  Final   Special Requests   Final    BOTTLES DRAWN AEROBIC AND ANAEROBIC Blood Culture adequate volume Blood Culture results may not be optimal due to an inadequate volume of blood received in culture bottles   Culture NO GROWTH < 24 HOURS  Final   Report Status PENDING  Incomplete  MRSA PCR Screening     Status: None   Collection Time: 03/22/17  2:59 PM  Result Value Ref Range Status   MRSA by PCR NEGATIVE NEGATIVE Final    Comment:        The GeneXpert MRSA Assay (FDA approved for NASAL specimens only), is one component of a  comprehensive MRSA colonization surveillance program. It is not intended to diagnose MRSA infection nor to guide or monitor treatment for MRSA infections.          Radiology Studies: Dg Chest 2 View  Result Date: 03/22/2017 CLINICAL DATA:  83 year old female with thoracic spine pain after falling earlier today. Additionally, patient has fever and chills. EXAM: CHEST  2 VIEW COMPARISON:  Prior chest x-ray 07/15/2015 FINDINGS: Stable cardiomegaly. Atherosclerotic calcifications present in the transverse aorta. Patchy airspace opacity in the lingula obscures the cardiac margin. Diffuse mild interstitial prominence with peripheral Kerley B-lines suggests chronic CHF. Overall, the degree of pulmonary vascular congestion is less than seen on prior chest x-rays. Extremely limited visualization of the thoracic spine secondary to diffuse demineralization of the osseous structures. No pneumothorax or pleural effusion. Mild lingular atelectasis. IMPRESSION: 1. Patchy airspace opacity in the lingula concerning for bronchopneumonia given the clinical history of fever and chills. Followup PA and lateral chest X-ray is recommended in 3-4 weeks following trial of antibiotic therapy to ensure resolution and exclude underlying malignancy. 2. Stable cardiomegaly and aortic  atherosclerosis. Aortic Atherosclerosis (ICD10-170.0) 3. Diffuse mild interstitial prominence likely representing mild chronic CHF. The degree of vascular congestion is less than seen on 07/15/2015. 4. Limited evaluation of the osseous structures secondary to advanced demineralization. No definite fracture. Electronically Signed   By: Malachy Moan M.D.   On: 03/22/2017 13:27        Scheduled Meds: . aspirin  325 mg Oral Q breakfast  . gabapentin  100 mg Oral BID  . polyethylene glycol  17 g Oral Daily  . pravastatin  40 mg Oral QHS   Continuous Infusions: . sodium chloride 75 mL/hr at 03/22/17 1719  . aztreonam 1 g (03/23/17 0537)  . levofloxacin (LEVAQUIN) IV    . vancomycin       LOS: 1 day    Time spent: 35 minutes.     Alba Cory, MD [facility] Pager [phone]  If 7PM-7AM, please contact night-coverage [email or web address] Password [email or web address] 03/23/2017, 1:17 PM

## 2017-03-23 NOTE — Evaluation (Signed)
Clinical/Bedside Swallow Evaluation Patient Details  Name: [name] MRN: [id number] Date of Birth: 1927/03/28  Today's Date: 03/23/2017 Time: SLP Start Time (ACUTE ONLY): 1531 SLP Stop Time (ACUTE ONLY): 1612 SLP Time Calculation (min) (ACUTE ONLY): 41 min  Past Medical History:  Past Medical History:  Diagnosis Date  . GERD (gastroesophageal reflux disease)   . Hyperlipidemia   . Hypertension   . Neuromuscular disorder [facility])    Past Surgical History:  Past Surgical History:  Procedure Laterality Date  . ABDOMINAL AORTIC ANEURYSM REPAIR    . BREAST BIOPSY    . CHOLECYSTECTOMY    . INGUINAL HERNIA REPAIR    . TOTAL HIP ARTHROPLASTY     HPI:  [name] is a 83 y.o. female with medical history significant of HTN, HLD, neuropathy, AAA, who presents with fever, chills, dysuria. Patient is hard of hearing. She is slowly answering questions. She denies pain. History obtain from son, he found her on the floor. Her head was on a soft couch. Patient denies any pain, dyspnea. Patient has been notice to be coughing. She has been complaining of dysuria.    Assessment / Plan / Recommendation Clinical Impression  Pt was administered a clinical swallow evaluation at bedside after reporting she had difficulty "swallowing" this morning at breakfast; she has been on a clear liquid diet since this am. Pt reported that she felt the problem had resolved before SLP began evaluation. Oral care was provided and oral mechanism exam was unremarkable. Pt consumed regular/puree textures and thin liquids with no overt s/sx of aspiration with any textures or PO presentations. MD has ordered Barium swallow NOT MBSS; family and patient report she has hx of GERD and upon further discussion pt is not recieving medication she recieved at home for GERD since admission. Question if pt was experiencing discomfort this am d/t an episode of reflux. Refer to GI and MD for further assessment and treatment. Recommend  resume regular diet/thin liquids follow standard aspiration precautions and GERD precautions. No further ST needs are noted at this time. ST to sign off.  SLP Visit Diagnosis: Dysphagia, unspecified (R13.10)    Aspiration Risk  Mild aspiration risk    Diet Recommendation Regular;Thin liquid   Liquid Administration via: Cup;Straw Medication Administration: Whole meds with liquid Supervision: Intermittent supervision to cue for compensatory strategies Compensations: Minimize environmental distractions;Slow rate;Small sips/bites Postural Changes: Seated upright at 90 degrees;Remain upright for at least 30 minutes after po intake    Other  Recommendations Recommended Consults: Consider GI evaluation Oral Care Recommendations: Oral care BID     Swallow Study   General Date of Onset: 03/22/17 HPI: [name] is a 83 y.o. female with medical history significant of HTN, HLD, neuropathy, AAA, who presents with fever, chills, dysuria. Patient is hard of hearing. She is slowly answering questions. She denies pain. History obtain from son, he found her on the floor. Her head was on a soft couch. Patient denies any pain, dyspnea. Patient has been notice to be coughing. She has been complaining of dysuria.  Type of Study: Bedside Swallow Evaluation Previous Swallow Assessment: none Diet Prior to this Study: Thin liquids (clear liquid diet) Temperature Spikes Noted: No Respiratory Status: Nasal cannula History of Recent Intubation: No Behavior/Cognition: Alert;Cooperative;Pleasant mood Oral Care Completed by SLP: Yes Oral Cavity - Dentition: Adequate natural dentition Vision: Functional for self-feeding Self-Feeding Abilities: Able to feed self Patient Positioning: Upright in bed Baseline Vocal Quality: Normal Volitional Cough: Strong Volitional Swallow:  Able to elicit    Oral/Motor/Sensory Function Overall Oral Motor/Sensory Function: Within functional limits   Ice Chips Ice chips: Within  functional limits   Thin Liquid Thin Liquid: Within functional limits    Nectar Thick Nectar Thick Liquid: Not tested   Honey Thick Honey Thick Liquid: Not tested   Puree Puree: Within functional limits   Solid   [name], CCC-SLP Speech Language Pathologist  Solid: Within functional limits        Georgetta Haber 03/23/2017,4:22 PM

## 2017-03-23 NOTE — Progress Notes (Signed)
Dr. Sunnie Nielsen notified of elevated BP's since 5pm; most recent 171/76.  MD to order PRN BP medication.  Will continue to monitor.

## 2017-03-23 NOTE — Progress Notes (Signed)
CRITICAL VALUE ALERT  Critical value received:  Lactic acid 3.7 @ 2000, Lactic acid @ 2.3 2145   Date of notification:  4/26  Time of notification:  2000, 2145  Critical value read back: yes  Nurse who received alert:  Merlyn Albert  MD notified (1st page):  NP Craige Cotta [facility]  Time of first page:    MD notified (2nd page):  Time of second page:  Responding MD:  NP Burna Mortimer  Time MD responded:  2002   Fluid bolus ordered for Lactic acid 3.7 1L NS   Flub bolus ordered for Lactic acid 2.3 500 NS  Patient tolerated well, will continue to monitor the patient closely.

## 2017-03-23 NOTE — Care Management Important Message (Signed)
Important Message  Patient Details  Name: [name] MRN: [id number] Date of Birth: October 23, 1927   Medicare Important Message Given:  Yes    Malcolm Metro, RN 03/23/2017, 1:29 PM

## 2017-03-24 DIAGNOSIS — J181 Lobar pneumonia, unspecified organism: Secondary | ICD-10-CM

## 2017-03-24 LAB — BASIC METABOLIC PANEL
ANION GAP: 5 (ref 5–15)
BUN: 20 mg/dL (ref 6–20)
CHLORIDE: 110 mmol/L (ref 101–111)
CO2: 21 mmol/L — ABNORMAL LOW (ref 22–32)
Calcium: 7.9 mg/dL — ABNORMAL LOW (ref 8.9–10.3)
Creatinine, Ser: 0.62 mg/dL (ref 0.44–1.00)
GFR calc Af Amer: >60 mL/min (ref >60)
GFR calc non Af Amer: >60 mL/min (ref >60)
Glucose, Bld: 99 mg/dL (ref 65–99)
POTASSIUM: 3.4 mmol/L — AB (ref 3.5–5.1)
Sodium: 136 mmol/L (ref 135–145)

## 2017-03-24 LAB — CBC
HCT: 35.1 % — ABNORMAL LOW (ref 36.0–46.0)
HEMOGLOBIN: 12.6 g/dL (ref 12.0–15.0)
MCH: 34.5 pg — ABNORMAL HIGH (ref 26.0–34.0)
MCHC: 35.9 g/dL (ref 30.0–36.0)
MCV: 96.2 fL (ref 78.0–100.0)
Platelets: 71 10*3/uL — ABNORMAL LOW (ref 150–400)
RBC: 3.65 MIL/uL — AB (ref 3.87–5.11)
RDW: 14.4 % (ref 11.5–15.5)
WBC: 7.2 10*3/uL (ref 4.0–10.5)

## 2017-03-24 LAB — HEPATITIS PANEL, ACUTE
HCV Ab: <0.1 s/co ratio (ref 0.0–0.9)
Hep A IgM: NEGATIVE
Hep B C IgM: NEGATIVE
Hepatitis B Surface Ag: NEGATIVE

## 2017-03-24 MED ORDER — LEVOFLOXACIN IN D5W 750 MG/150ML IV SOLN
750.0000 mg | INTRAVENOUS | Status: DC
  Administered 2017-03-24: 750 mg via INTRAVENOUS
  Filled 2017-03-24: qty 150

## 2017-03-24 MED ORDER — METOPROLOL TARTRATE 25 MG PO TABS
12.5000 mg | ORAL_TABLET | Freq: Two times a day (BID) | ORAL | Status: DC
  Administered 2017-03-24 – 2017-03-27 (×6): 12.5 mg via ORAL
  Filled 2017-03-24 (×6): qty 1

## 2017-03-24 MED ORDER — GABAPENTIN 100 MG PO CAPS
100.0000 mg | ORAL_CAPSULE | Freq: Every day | ORAL | Status: DC
  Administered 2017-03-24 – 2017-03-27 (×4): 100 mg via ORAL
  Filled 2017-03-24 (×4): qty 1

## 2017-03-24 MED ORDER — POTASSIUM CHLORIDE CRYS ER 20 MEQ PO TBCR
40.0000 meq | EXTENDED_RELEASE_TABLET | Freq: Once | ORAL | Status: AC
  Administered 2017-03-24: 40 meq via ORAL
  Filled 2017-03-24: qty 2

## 2017-03-24 MED ORDER — LISINOPRIL 10 MG PO TABS
10.0000 mg | ORAL_TABLET | Freq: Every day | ORAL | Status: DC
  Administered 2017-03-24 – 2017-03-27 (×4): 10 mg via ORAL
  Filled 2017-03-24 (×4): qty 1

## 2017-03-24 MED ORDER — GABAPENTIN 300 MG PO CAPS
300.0000 mg | ORAL_CAPSULE | Freq: Every day | ORAL | Status: DC
  Administered 2017-03-24 – 2017-03-26 (×3): 300 mg via ORAL
  Filled 2017-03-24 (×3): qty 1

## 2017-03-24 MED ORDER — GABAPENTIN 100 MG PO CAPS
100.0000 mg | ORAL_CAPSULE | Freq: Every morning | ORAL | Status: DC
  Administered 2017-03-24 – 2017-03-27 (×3): 100 mg via ORAL
  Filled 2017-03-24 (×3): qty 1

## 2017-03-24 MED ORDER — FUROSEMIDE 20 MG PO TABS
20.0000 mg | ORAL_TABLET | Freq: Every day | ORAL | Status: DC
  Administered 2017-03-24: 20 mg via ORAL
  Filled 2017-03-24: qty 1

## 2017-03-24 NOTE — Progress Notes (Signed)
PROGRESS NOTE    [name]  WUJ:[id number] DOB: February 24, 1927 DOA: 03/22/2017 PCP: Dwana Melena, MD    Brief Narrative: [name] is a 83 y.o. female with medical history significant of HTN, HLD, neuropathy, AAA, who presents with fever, chills, dysuria. Patient is hard of hearing. She is slowly answering questions. She denies pain. History obtain from son, he found her on the floor. Her head was on a soft couch. Patient denies any pain, dyspnea. Patient has been notice to be coughing. She has been complaining of dysuria.   ED Course: Patinet found to be febrile tempeture at 101, lactic acid 2.8, WBC 14, UA with too numerous to count WBC, Chest x ray: Patchy airspace opacity in the lingula concerning for bronchopneumonia given the clinical history of fever and chills. Followup PA and lateral chest X-ray is recommended in 3-4 weeks. following trial of antibiotic therapy to ensure resolution and exclude underlying malignancy.Diffuse mild interstitial prominence likely representing mild chronic CHF.   Assessment & Plan:   Active Problems:   Chronic CHF (HCC)   Essential hypertension   Neuropathy   Sepsis (HCC)   Acute lower UTI   Community acquired pneumonia   1-Sepsis; Secondary to UTI, PNA.  Presents with fevers, leukocytosis, UA with too numerous to count WBC. Chest x ray; air space opacity in the lingula.  Continue with IV Levaquin and Aztreonam.. Discontinue vancomycin. MRSA PCR negative Follows Blood culture; no growth to date., urine culture pending.  Lactic acid trending down.   2-Neuropathy; continue with gabapentin. Change to home dose.   3-HTN; resume lasix, lisinopril.   4-Chronic Diastolic HF;  Resume home dose lasix.   5-Transaminases;mildly increase AST.  Hepatitis panel negative.   6-Acute Hypoxic Respiratory Failure.  In setting of PNA.  Incentive spirometry, IV antibiotics.   7-Thrombocytopenia; chronic.  Follow trend. Stable.   8-Dysphagia;    Difficulty swallowing solid.  Esophagogram ordered.   Speech swallow evaluation. Recommending regular thin diet.  Added Protonix.   9-Hypokalemia; replete orally BID.   DVT prophylaxis: SCD, due to thrombocytopenia.  Code Status: DNR Family Communication: Son who was at bedside.  Disposition Plan: to be determine.  Consultants:   none   Procedures: none  Antimicrobials:  Levaquin, aztreonam.    Subjective: She is alert, she is not feeling well today. She was not able to sleep last night/  She denies chest pain or dyspnea.   Objective: Vitals:   03/24/17 0400 03/24/17 0500 03/24/17 0600 03/24/17 0708  BP: 138/66 (!) 161/75 (!) 169/77   Pulse: 83 87 95 (!) 103  Resp: 20 (!) 22 (!) 26 (!) 21  Temp: 98.1 F (36.7 C)   98.2 F (36.8 C)  TempSrc: Oral   Oral  SpO2: 93% 95% 93% 91%  Weight: 84.9 kg (187 lb 2.7 oz)     Height:        Intake/Output Summary (Last 24 hours) at 03/24/17 0758 Last data filed at 03/23/17 1900  Gross per 24 hour  Intake             1820 ml  Output              575 ml  Net             1245 ml   Filed Weights   03/22/17 1552 03/23/17 0500 03/24/17 0400  Weight: 77.8 kg (171 lb 8.3 oz) 77.8 kg (171 lb 8.3 oz) 84.9 kg (187 lb 2.7 oz)  Examination:  General exam: NAD Respiratory system: Clear to auscultation. Respiratory effort normal. Cardiovascular system: S1 & S2 heard, RRR. No JVD, murmurs, rubs, gallops or clicks. No pedal edema. Gastrointestinal system: soft, NT, No distended.  Central nervous system: non focal.  Extremities: symmetric power.  Skin: No rashes, lesions or ulcers Psychiatry: Mood and affect appropriate.     Data Reviewed: I have personally reviewed following labs and imaging studies  CBC:  Recent Labs Lab 03/22/17 1156 03/23/17 0412 03/24/17 0430  WBC 14.3* 6.9 7.2  NEUTROABS 13.2*  --   --   HGB 12.7 11.5* 12.6  HCT 36.4 33.5* 35.1*  MCV 96.3 97.7 96.2  PLT 84* 80* 71*   Basic Metabolic  Panel:  Recent Labs Lab 03/22/17 1156 03/23/17 0412 03/24/17 0430  NA 137 140 136  K 3.5 3.8 3.4*  CL 106 113* 110  CO2 22 20* 21*  GLUCOSE 105* 104* 99  BUN 20 30* 20  CREATININE 0.77 0.82 0.62  CALCIUM 8.8* 8.1* 7.9*   GFR: Estimated Creatinine Clearance: 56.6 mL/min (by C-G formula based on SCr of 0.62 mg/dL). Liver Function Tests:  Recent Labs Lab 03/22/17 1156 03/23/17 0412  AST 88* 92*  ALT 23 25  ALKPHOS 80 58  BILITOT 2.9* 1.8*  PROT 6.6 5.3*  ALBUMIN 2.8* 2.2*   No results for input(s): LIPASE, AMYLASE in the last 168 hours. No results for input(s): AMMONIA in the last 168 hours. Coagulation Profile: No results for input(s): INR, PROTIME in the last 168 hours. Cardiac Enzymes: No results for input(s): CKTOTAL, CKMB, CKMBINDEX, TROPONINI in the last 168 hours. BNP (last 3 results) No results for input(s): PROBNP in the last 8760 hours. HbA1C: No results for input(s): HGBA1C in the last 72 hours. CBG: No results for input(s): GLUCAP in the last 168 hours. Lipid Profile: No results for input(s): CHOL, HDL, LDLCALC, TRIG, CHOLHDL, LDLDIRECT in the last 72 hours. Thyroid Function Tests: No results for input(s): TSH, T4TOTAL, FREET4, T3FREE, THYROIDAB in the last 72 hours. Anemia Panel: No results for input(s): VITAMINB12, FOLATE, FERRITIN, TIBC, IRON, RETICCTPCT in the last 72 hours. Sepsis Labs:  Recent Labs Lab 03/22/17 1156 03/22/17 1449 03/22/17 1827 03/22/17 2055  LATICACIDVEN 2.8* 2.0* 3.7* 2.3*    Recent Results (from the past 240 hour(s))  Culture, blood (routine x 2)     Status: None (Preliminary result)   Collection Time: 03/22/17 11:56 AM  Result Value Ref Range Status   Specimen Description BLOOD RIGHT ARM  Final   Special Requests   Final    BOTTLES DRAWN AEROBIC AND ANAEROBIC Blood Culture adequate volume   Culture NO GROWTH 2 DAYS  Final   Report Status PENDING  Incomplete  Culture, blood (routine x 2)     Status: None  (Preliminary result)   Collection Time: 03/22/17 12:14 PM  Result Value Ref Range Status   Specimen Description BLOOD LEFT ANTECUBITAL  Final   Special Requests   Final    BOTTLES DRAWN AEROBIC AND ANAEROBIC Blood Culture adequate volume Blood Culture results may not be optimal due to an inadequate volume of blood received in culture bottles   Culture NO GROWTH 2 DAYS  Final   Report Status PENDING  Incomplete  MRSA PCR Screening     Status: None   Collection Time: 03/22/17  2:59 PM  Result Value Ref Range Status   MRSA by PCR NEGATIVE NEGATIVE Final    Comment:        The  GeneXpert MRSA Assay (FDA approved for NASAL specimens only), is one component of a comprehensive MRSA colonization surveillance program. It is not intended to diagnose MRSA infection nor to guide or monitor treatment for MRSA infections.          Radiology Studies: Dg Chest 2 View  Result Date: 03/22/2017 CLINICAL DATA:  83 year old female with thoracic spine pain after falling earlier today. Additionally, patient has fever and chills. EXAM: CHEST  2 VIEW COMPARISON:  Prior chest x-ray 07/15/2015 FINDINGS: Stable cardiomegaly. Atherosclerotic calcifications present in the transverse aorta. Patchy airspace opacity in the lingula obscures the cardiac margin. Diffuse mild interstitial prominence with peripheral Kerley B-lines suggests chronic CHF. Overall, the degree of pulmonary vascular congestion is less than seen on prior chest x-rays. Extremely limited visualization of the thoracic spine secondary to diffuse demineralization of the osseous structures. No pneumothorax or pleural effusion. Mild lingular atelectasis. IMPRESSION: 1. Patchy airspace opacity in the lingula concerning for bronchopneumonia given the clinical history of fever and chills. Followup PA and lateral chest X-ray is recommended in 3-4 weeks following trial of antibiotic therapy to ensure resolution and exclude underlying malignancy. 2. Stable  cardiomegaly and aortic atherosclerosis. Aortic Atherosclerosis (ICD10-170.0) 3. Diffuse mild interstitial prominence likely representing mild chronic CHF. The degree of vascular congestion is less than seen on 07/15/2015. 4. Limited evaluation of the osseous structures secondary to advanced demineralization. No definite fracture. Electronically Signed   By: Malachy Moan M.D.   On: 03/22/2017 13:27        Scheduled Meds: . aspirin  325 mg Oral Q breakfast  . feeding supplement (ENSURE ENLIVE)  237 mL Oral BID BM  . furosemide  20 mg Oral Daily  . gabapentin  100 mg Oral q morning - 10a  . gabapentin  300 mg Oral QHS  . lisinopril  10 mg Oral Daily  . pantoprazole  40 mg Oral BID  . polyethylene glycol  17 g Oral Daily  . potassium chloride  40 mEq Oral Once  . pravastatin  40 mg Oral QHS   Continuous Infusions: . aztreonam Stopped (03/24/17 0123)  . levofloxacin (LEVAQUIN) IV Stopped (03/23/17 1627)     LOS: 2 days    Time spent: 35 minutes.     Alba Cory, MD [facility] Pager [phone]  If 7PM-7AM, please contact night-coverage [email or web address] Password [facility] 03/24/2017, 7:58 AM

## 2017-03-24 NOTE — Progress Notes (Signed)
Patient transferred from ICU. Patient alert and oriented and able to walk to bed with assistance.  No complaints voiced.  Call bell within reach.

## 2017-03-24 NOTE — Progress Notes (Addendum)
Pharmacy Antibiotic Note  [name] is a 83 y.o. female admitted on 03/22/2017 with sepsis.  Pharmacy has been consulted for Aztreonam and Levaquin dosing. E coli in UC res to FQs  Plan: Aztreonam 1gm IV q8h Levaquin 750mg  IV q24h f/u cxs and clinical progress   Height: 5\' 9"  (175.3 cm) Weight: 187 lb 2.7 oz (84.9 kg) IBW/kg (Calculated) : 66.2  Temp (24hrs), Avg:98.4 F (36.9 C), Min:97.3 F (36.3 C), Max:100 F (37.8 C)   Recent Labs Lab 03/22/17 1156 03/22/17 1449 03/22/17 1827 03/22/17 2055 03/23/17 0412 03/24/17 0430  WBC 14.3*  --   --   --  6.9 7.2  CREATININE 0.77  --   --   --  0.82 0.62  LATICACIDVEN 2.8* 2.0* 3.7* 2.3*  --   --     Estimated Creatinine Clearance: 56.6 mL/min (by C-G formula based on SCr of 0.62 mg/dL).    Allergies  Allergen Reactions  . Sulfa Antibiotics Anaphylaxis  . Cephalexin Itching and Rash    Antimicrobials this admission: Vancomycin 4/26 >> 4/28 Aztreonam 4/26 >> Levaquin 4/26 >>   Microbiology results: 4/26 BCx: pending 4/26 Sputum: pending 4/25 MRSA PCR: (-) 4/26 UC>> E coli, rest to cipro, bactrim, Sens to ancef, CTX   Thanks for allowing pharmacy to be a part of this patient's care.  Talbert Cage, PharmD Clinical Pharmacist  03/24/2017 8:50 AM

## 2017-03-24 NOTE — Progress Notes (Signed)
PT TRANSFERRING TO ROOM 331 AS MED/SURG PT. PICC LINE AND IV SITES X2 PATENT.O2 @ 3L/MIN VIA [place]. PT VOIDING CLEAR YELLOW URINE.SON RONALD WHEELER,POA, AT BEDSIDE. TRANSFER REPORT CALLED TO LEANDER KOGER RN ON 300.

## 2017-03-25 LAB — BASIC METABOLIC PANEL
Anion gap: 6 (ref 5–15)
BUN: 20 mg/dL (ref 6–20)
CHLORIDE: 107 mmol/L (ref 101–111)
CO2: 22 mmol/L (ref 22–32)
CREATININE: 0.58 mg/dL (ref 0.44–1.00)
Calcium: 8 mg/dL — ABNORMAL LOW (ref 8.9–10.3)
GFR calc Af Amer: >60 mL/min (ref >60)
GFR calc non Af Amer: >60 mL/min (ref >60)
Glucose, Bld: 96 mg/dL (ref 65–99)
POTASSIUM: 3.9 mmol/L (ref 3.5–5.1)
SODIUM: 135 mmol/L (ref 135–145)

## 2017-03-25 LAB — CBC
HCT: 35 % — ABNORMAL LOW (ref 36.0–46.0)
Hemoglobin: 12.2 g/dL (ref 12.0–15.0)
MCH: 33.4 pg (ref 26.0–34.0)
MCHC: 34.9 g/dL (ref 30.0–36.0)
MCV: 95.9 fL (ref 78.0–100.0)
Platelets: 85 10*3/uL — ABNORMAL LOW (ref 150–400)
RBC: 3.65 MIL/uL — ABNORMAL LOW (ref 3.87–5.11)
RDW: 14.5 % (ref 11.5–15.5)
WBC: 7.7 10*3/uL (ref 4.0–10.5)

## 2017-03-25 MED ORDER — POTASSIUM CHLORIDE CRYS ER 20 MEQ PO TBCR
40.0000 meq | EXTENDED_RELEASE_TABLET | Freq: Once | ORAL | Status: AC
  Administered 2017-03-25: 40 meq via ORAL
  Filled 2017-03-25: qty 2

## 2017-03-25 MED ORDER — FUROSEMIDE 10 MG/ML IJ SOLN
20.0000 mg | Freq: Once | INTRAMUSCULAR | Status: AC
  Administered 2017-03-25: 20 mg via INTRAVENOUS
  Filled 2017-03-25: qty 2

## 2017-03-25 MED ORDER — NITROFURANTOIN MONOHYD MACRO 100 MG PO CAPS
ORAL_CAPSULE | ORAL | Status: AC
  Filled 2017-03-25: qty 1

## 2017-03-25 MED ORDER — NITROFURANTOIN MONOHYD MACRO 100 MG PO CAPS
100.0000 mg | ORAL_CAPSULE | Freq: Two times a day (BID) | ORAL | Status: DC
  Administered 2017-03-25 – 2017-03-27 (×5): 100 mg via ORAL
  Filled 2017-03-25 (×7): qty 1

## 2017-03-25 MED ORDER — LORAZEPAM 1 MG PO TABS
1.0000 mg | ORAL_TABLET | Freq: Once | ORAL | Status: AC
  Administered 2017-03-25: 1 mg via ORAL
  Filled 2017-03-25: qty 1

## 2017-03-25 MED ORDER — LEVOFLOXACIN 500 MG PO TABS
500.0000 mg | ORAL_TABLET | Freq: Every day | ORAL | Status: DC
  Administered 2017-03-25: 500 mg via ORAL
  Filled 2017-03-25: qty 1

## 2017-03-25 NOTE — Progress Notes (Signed)
PROGRESS NOTE    Danelle Curiale Sylla  ZOX:[id number] DOB: 29-Jul-1927 DOA: 03/22/2017 PCP: Dwana Melena, MD    Brief Narrative: Loreen Bankson Eddleman is a 83 y.o. female with medical history significant of HTN, HLD, neuropathy, AAA, who presents with fever, chills, dysuria. Patient is hard of hearing. She is slowly answering questions. She denies pain. History obtain from son, he found her on the floor. Her head was on a soft couch. Patient denies any pain, dyspnea. Patient has been notice to be coughing. She has been complaining of dysuria.   ED Course: Pat[place] found to be febrile tempeture at 101, lactic acid 2.8, WBC 14, UA with too numerous to count WBC, Chest x ray: Patchy airspace opacity in the lingula concerning for bronchopneumonia given the clinical history of fever and chills. Followup PA and lateral chest X-ray is recommended in 3-4 weeks. following trial of antibiotic therapy to ensure resolution and exclude underlying malignancy.Diffuse mild interstitial prominence likely representing mild chronic CHF.   Assessment & Plan:   Active Problems:   Chronic CHF (HCC)   Essential hypertension   Neuropathy   Sepsis (HCC)   Acute lower UTI   Community acquired pneumonia   1-Sepsis; Secondary to UTI, PNA.  Presents with fevers, leukocytosis, UA with too numerous to count WBC. Chest x ray; air space opacity in the lingula.  Received 3 days of  IV Levaquin and Aztreonam.. Discontinue vancomycin. MRSA PCR negative Follows Blood culture; no growth to date., urine culture growing E coli,  Lactic acid trending down.  Start microbid for E coli UTI.  Levaquin orally for PNA>   2-Neuropathy; continue with gabapentin, home dose.   3-HTN;Continue with lasix, lisinopril.   4-Acute on Chronic Diastolic HF; mild exacerbation Complaining of SOB on exertion. Will give one time dose of lasix.   5-Transaminases;mildly increase AST.  Hepatitis panel negative.   6-Acute Hypoxic Respiratory Failure.    In setting of PNA.  Incentive spirometry, change Levaquin to oral.   7-Thrombocytopenia; chronic.  Follow trend. Stable.   8-Dysphagia;  Difficulty swallowing solid.  Esophagogram ordered.   Speech swallow evaluation. Recommending regular thin diet.  Continue with Protonix.   9-Hypokalemia;  Resolved.   DVT prophylaxis: SCD, due to thrombocytopenia.  Code Status: DNR Family Communication: Son who was at bedside.  Disposition Plan: to be determine.  Consultants:   none   Procedures: none  Antimicrobials:  Levaquin, aztreonam.    Subjective: She is feeling better today, report dyspnea one exertion   Objective: Vitals:   03/24/17 1530 03/24/17 2100 03/24/17 2119 03/25/17 0554  BP: (!) 174/74  (!) 125/99 (!) 151/61  Pulse: (!) 110 (!) 107  87  Resp: (!) 27 (!) 22  18  Temp:  98.6 F (37 C)  98.7 F (37.1 C)  TempSrc:  Oral  Oral  SpO2: 94%  94% 92%  Weight:    85.6 kg (188 lb 11.4 oz)  Height:        Intake/Output Summary (Last 24 hours) at 03/25/17 0906 Last data filed at 03/24/17 1852  Gross per 24 hour  Intake             1230 ml  Output              450 ml  Net              780 ml   Filed Weights   03/23/17 0500 03/24/17 0400 03/25/17 0554  Weight: 77.8 kg (171 lb 8.3 oz)  84.9 kg (187 lb 2.7 oz) 85.6 kg (188 lb 11.4 oz)    Examination:  General exam NAD Respiratory system: bilateral crackles, wheezing.  Cardiovascular system: S1 & S2 heard, RRR. No pedal edema. Gastrointestinal system: soft, NT, No distended.  Central nervous system: non focal.  Extremities: normal power.  Skin: No rashes, lesions or ulcers Psychiatry: Mood and affect appropriate.     Data Reviewed: I have personally reviewed following labs and imaging studies  CBC:  Recent Labs Lab 03/22/17 1156 03/23/17 0412 03/24/17 0430 03/25/17 0735  WBC 14.3* 6.9 7.2 7.7  NEUTROABS 13.2*  --   --   --   HGB 12.7 11.5* 12.6 12.2  HCT 36.4 33.5* 35.1* 35.0*  MCV 96.3 97.7  96.2 95.9  PLT 84* 80* 71* 85*   Basic Metabolic Panel:  Recent Labs Lab 03/22/17 1156 03/23/17 0412 03/24/17 0430 03/25/17 0735  NA 137 140 136 135  K 3.5 3.8 3.4* 3.9  CL 106 113* 110 107  CO2 22 20* 21* 22  GLUCOSE 105* 104* 99 96  BUN 20 30* 20 20  CREATININE 0.77 0.82 0.62 0.58  CALCIUM 8.8* 8.1* 7.9* 8.0*   GFR: Estimated Creatinine Clearance: 56.8 mL/min (by C-G formula based on SCr of 0.58 mg/dL). Liver Function Tests:  Recent Labs Lab 03/22/17 1156 03/23/17 0412  AST 88* 92*  ALT 23 25  ALKPHOS 80 58  BILITOT 2.9* 1.8*  PROT 6.6 5.3*  ALBUMIN 2.8* 2.2*   No results for input(s): LIPASE, AMYLASE in the last 168 hours. No results for input(s): AMMONIA in the last 168 hours. Coagulation Profile: No results for input(s): INR, PROTIME in the last 168 hours. Cardiac Enzymes: No results for input(s): CKTOTAL, CKMB, CKMBINDEX, TROPONINI in the last 168 hours. BNP (last 3 results) No results for input(s): PROBNP in the last 8760 hours. HbA1C: No results for input(s): HGBA1C in the last 72 hours. CBG: No results for input(s): GLUCAP in the last 168 hours. Lipid Profile: No results for input(s): CHOL, HDL, LDLCALC, TRIG, CHOLHDL, LDLDIRECT in the last 72 hours. Thyroid Function Tests: No results for input(s): TSH, T4TOTAL, FREET4, T3FREE, THYROIDAB in the last 72 hours. Anemia Panel: No results for input(s): VITAMINB12, FOLATE, FERRITIN, TIBC, IRON, RETICCTPCT in the last 72 hours. Sepsis Labs:  Recent Labs Lab 03/22/17 1156 03/22/17 1449 03/22/17 1827 03/22/17 2055  LATICACIDVEN 2.8* 2.0* 3.7* 2.3*    Recent Results (from the past 240 hour(s))  Culture, blood (routine x 2)     Status: None (Preliminary result)   Collection Time: 03/22/17 11:56 AM  Result Value Ref Range Status   Specimen Description BLOOD RIGHT ARM  Final   Special Requests   Final    BOTTLES DRAWN AEROBIC AND ANAEROBIC Blood Culture adequate volume   Culture NO GROWTH 2 DAYS   Final   Report Status PENDING  Incomplete  Culture, blood (routine x 2)     Status: None (Preliminary result)   Collection Time: 03/22/17 12:14 PM  Result Value Ref Range Status   Specimen Description BLOOD LEFT ANTECUBITAL  Final   Special Requests   Final    BOTTLES DRAWN AEROBIC AND ANAEROBIC Blood Culture adequate volume Blood Culture results may not be optimal due to an inadequate volume of blood received in culture bottles   Culture NO GROWTH 2 DAYS  Final   Report Status PENDING  Incomplete  Urine culture     Status: Abnormal (Preliminary result)   Collection Time: 03/22/17  1:30  PM  Result Value Ref Range Status   Specimen Description URINE, RANDOM  Final   Special Requests NONE  Final   Culture >=100,000 COLONIES/mL ESCHERICHIA COLI (A)  Final   Report Status PENDING  Incomplete   Organism ID, Bacteria ESCHERICHIA COLI (A)  Final      Susceptibility   Escherichia coli - MIC*    AMPICILLIN >=32 RESISTANT Resistant     CEFAZOLIN <=4 SENSITIVE Sensitive     CEFTRIAXONE <=1 SENSITIVE Sensitive     CIPROFLOXACIN >=4 RESISTANT Resistant     GENTAMICIN <=1 SENSITIVE Sensitive     IMIPENEM <=0.25 SENSITIVE Sensitive     NITROFURANTOIN <=16 SENSITIVE Sensitive     TRIMETH/SULFA >=320 RESISTANT Resistant     AMPICILLIN/SULBACTAM 16 INTERMEDIATE Intermediate     PIP/TAZO <=4 SENSITIVE Sensitive     Extended ESBL Value in next row Sensitive      NEGATIVEPerformed at [facility], [address], [place], [place] [address]    * >=100,000 COLONIES/mL ESCHERICHIA COLI  MRSA PCR Screening     Status: None   Collection Time: 03/22/17  2:59 PM  Result Value Ref Range Status   MRSA by PCR NEGATIVE NEGATIVE Final    Comment:        The GeneXpert MRSA Assay (FDA approved for NASAL specimens only), is one component of a comprehensive MRSA colonization surveillance program. It is not intended to diagnose MRSA infection nor to guide or monitor treatment for MRSA infections.            Radiology Studies: No results found.      Scheduled Meds: . aspirin  325 mg Oral Q breakfast  . feeding supplement (ENSURE ENLIVE)  237 mL Oral BID BM  . furosemide  20 mg Intravenous Once  . gabapentin  100 mg Oral q morning - 10a  . gabapentin  100 mg Oral Q1400  . gabapentin  300 mg Oral QHS  . levofloxacin  500 mg Oral Daily  . lisinopril  10 mg Oral Daily  . metoprolol tartrate  12.5 mg Oral BID  . nitrofurantoin (macrocrystal-monohydrate)  100 mg Oral Q12H  . pantoprazole  40 mg Oral BID  . polyethylene glycol  17 g Oral Daily  . pravastatin  40 mg Oral QHS   Continuous Infusions:    LOS: 3 days    Time spent: 35 minutes.     Alba Cory, MD [facility] Pager [phone]  If 7PM-7AM, please contact night-coverage [email or web address] Password [email or web address] 03/25/2017, 9:06 AM

## 2017-03-26 LAB — HEPATIC FUNCTION PANEL
ALBUMIN: 2.3 g/dL — AB (ref 3.5–5.0)
ALT: 27 U/L (ref 14–54)
AST: 48 U/L — AB (ref 15–41)
Alkaline Phosphatase: 82 U/L (ref 38–126)
BILIRUBIN DIRECT: 0.5 mg/dL (ref 0.1–0.5)
Indirect Bilirubin: 1.2 mg/dL — ABNORMAL HIGH (ref 0.3–0.9)
Total Bilirubin: 1.7 mg/dL — ABNORMAL HIGH (ref 0.3–1.2)
Total Protein: 5.6 g/dL — ABNORMAL LOW (ref 6.5–8.1)

## 2017-03-26 LAB — BASIC METABOLIC PANEL
Anion gap: 4 — ABNORMAL LOW (ref 5–15)
BUN: 19 mg/dL (ref 6–20)
CALCIUM: 8.1 mg/dL — AB (ref 8.9–10.3)
CHLORIDE: 106 mmol/L (ref 101–111)
CO2: 26 mmol/L (ref 22–32)
CREATININE: 0.66 mg/dL (ref 0.44–1.00)
GFR calc non Af Amer: >60 mL/min (ref >60)
Glucose, Bld: 88 mg/dL (ref 65–99)
Potassium: 3.8 mmol/L (ref 3.5–5.1)
SODIUM: 136 mmol/L (ref 135–145)

## 2017-03-26 LAB — AMMONIA: Ammonia: 56 umol/L — ABNORMAL HIGH (ref 9–35)

## 2017-03-26 MED ORDER — CLINDAMYCIN HCL 150 MG PO CAPS
300.0000 mg | ORAL_CAPSULE | Freq: Three times a day (TID) | ORAL | Status: DC
  Administered 2017-03-26 – 2017-03-27 (×4): 300 mg via ORAL
  Filled 2017-03-26 (×4): qty 2

## 2017-03-26 MED ORDER — ALPRAZOLAM 0.25 MG PO TABS: 0.2500 mg | ORAL_TABLET | Freq: Every evening | ORAL | Status: DC | PRN

## 2017-03-26 MED ORDER — ALBUTEROL SULFATE (2.5 MG/3ML) 0.083% IN NEBU
2.5000 mg | INHALATION_SOLUTION | Freq: Four times a day (QID) | RESPIRATORY_TRACT | Status: DC
  Administered 2017-03-26 – 2017-03-27 (×3): 2.5 mg via RESPIRATORY_TRACT
  Filled 2017-03-26 (×3): qty 3

## 2017-03-26 MED ORDER — FUROSEMIDE 40 MG PO TABS
40.0000 mg | ORAL_TABLET | Freq: Every day | ORAL | Status: DC
  Administered 2017-03-26 – 2017-03-27 (×2): 40 mg via ORAL
  Filled 2017-03-26 (×2): qty 1

## 2017-03-26 MED ORDER — HALOPERIDOL LACTATE 5 MG/ML IJ SOLN
5.0000 mg | Freq: Once | INTRAMUSCULAR | Status: AC
  Administered 2017-03-26: 5 mg via INTRAVENOUS
  Filled 2017-03-26: qty 1

## 2017-03-26 MED ORDER — ALBUTEROL SULFATE (2.5 MG/3ML) 0.083% IN NEBU
2.5000 mg | INHALATION_SOLUTION | Freq: Four times a day (QID) | RESPIRATORY_TRACT | Status: DC | PRN
  Administered 2017-03-26: 2.5 mg via RESPIRATORY_TRACT
  Filled 2017-03-26: qty 3

## 2017-03-26 MED ORDER — LACTULOSE 10 GM/15ML PO SOLN
30.0000 g | Freq: Two times a day (BID) | ORAL | Status: DC
  Administered 2017-03-26 – 2017-03-27 (×3): 30 g via ORAL
  Filled 2017-03-26 (×3): qty 60

## 2017-03-26 NOTE — Plan of Care (Addendum)
Problem: Safety: Goal: Ability to remain free from injury will improve Outcome: Not Progressing Pt has poor safety judgement and attempts to get out of bed without assistance. Bed alarm on, pt educated on importance of calling for help before getting up, nurse and nurse tech doing hourly rounds.

## 2017-03-26 NOTE — Evaluation (Signed)
Physical Therapy Evaluation Patient Details Name: [name] MRN: [id number] DOB: Feb 22, 1927 Today's Date: 03/26/2017   History of Present Illness  83 y.o. female with medical history significant of HTN, HLD, neuropathy, AAA, who presents with fever, chills, dysuria. Patient is hard of hearing. She is slowly answering questions. She denies pain. History obtain from son, he found her on the floor. Her head was on a soft couch. Patient denies any pain, dyspnea. Patient has been notice to be coughing. She has been complaining of dysuria.   Dx: Sepsis due to UTI and PNA.    Clinical Impression  Pt received sitting on [facility] with nursing staff present.  Pt is agreeable to PT evaluation.  Pt is unable to provide information regarding PLOF or home set up.  Chart review states that she lives alone with an aide that comes 3x's/wk.  Pt is only oriented x1 today.  She was able to ambulate 71ft with RW and Min A due to poor initiation.  She is recommended for SNF at this time due to need for increased assistance with all levels of functional mobility at this time.    Follow Up Recommendations SNF;Supervision/Assistance - 24 hour    Equipment Recommendations  None recommended by PT    Recommendations for Other Services       Precautions / Restrictions Precautions Precautions: Fall Restrictions Weight Bearing Restrictions: No      Mobility  Bed Mobility               General bed mobility comments: Not observed, pt up on [facility] with nursing staff when PT entered the room.   Transfers Overall transfer level: Needs assistance Equipment used: Rolling walker (2 wheeled) Transfers: Sit to/from [name] Sit to Stand: Min assist Stand pivot transfers: Min assist          Ambulation/Gait Ambulation/Gait assistance: Min assist Ambulation Distance (Feet): 60 Feet Assistive device: Rolling walker (2 wheeled) Gait Pattern/deviations: Step-through pattern;Trunk flexed    Gait velocity interpretation: <1.8 ft/sec, indicative of risk for recurrent falls General Gait Details: Pt demonstrates poor initiation, and requires assistance for fwd progression of RW.    Stairs            Wheelchair Mobility    Modified Rankin (Stroke Patients Only)       Balance Overall balance assessment: History of Falls;Needs assistance                                           Pertinent Vitals/Pain Pain Assessment: No/denies pain    Home Living   Living Arrangements: Alone Available Help at Discharge: Home health (Per chart review, pt has an aide that assists her on M,W,F for meals)           Home Equipment: Walker - 2 wheels;Cane - single point Additional Comments: Unable to obtain home set up information.  Pt is very HOH, as well as confused.      Prior Function Level of Independence: Needs assistance   Gait / Transfers Assistance Needed: Pt states that she uses a cane all the time.   ADL's / Homemaking Assistance Needed: Pt is unable to answer questions regarding ADL's.         Hand Dominance        Extremity/Trunk Assessment   Upper Extremity Assessment Upper Extremity Assessment: Overall WFL for tasks assessed  Lower Extremity Assessment Lower Extremity Assessment: Generalized weakness       Communication   Communication: HOH  Cognition Arousal/Alertness: Awake/alert Behavior During Therapy: WFL for tasks assessed/performed Overall Cognitive Status: Impaired/Different from baseline Area of Impairment: Orientation                 Orientation Level: Disoriented to;Place;Time;Situation             General Comments: Pt states that she is down the street, and she is here because she broke her hip, and states it is Feb. 29.  - Pt was re-oriented, but remains confused.       General Comments      Exercises     Assessment/Plan    PT Assessment Patient needs continued PT services  PT Problem List  Decreased strength;Decreased activity tolerance;Decreased balance;Decreased mobility;Decreased cognition;Decreased knowledge of use of DME;Decreased safety awareness       PT Treatment Interventions DME instruction;Gait training;Functional mobility training;Therapeutic activities;Therapeutic exercise;Balance training;Patient/family education;Cognitive remediation    PT Goals (Current goals can be found in the Care Plan section)  Acute Rehab PT Goals PT Goal Formulation: Patient unable to participate in goal setting Time For Goal Achievement: 04/09/17 Potential to Achieve Goals: Fair    Frequency Min 3X/week   Barriers to discharge Decreased caregiver support Pt lives alone    Co-evaluation               End of Session Equipment Utilized During Treatment: Gait belt Activity Tolerance: Patient tolerated treatment well Patient left: in chair;with call bell/phone within reach;with chair alarm set Nurse Communication: Mobility status (Megan, RN aware of pt's location, and mobiltiy sheet left hanging in the room. ) PT Visit Diagnosis: Other abnormalities of gait and mobility (R26.89);Muscle weakness (generalized) (M62.81);History of falling (Z91.81)    Time: 4098-1191 PT Time Calculation (min) (ACUTE ONLY): 17 min   Charges:   PT Evaluation $PT Eval Low Complexity: 1 Procedure     PT G Codes:   PT G-Codes **NOT FOR INPATIENT CLASS** Functional Assessment Tool Used: AM-PAC 6 Clicks Basic Mobility;Clinical judgement Functional Limitation: Mobility: Walking and moving around Mobility: Walking and Moving Around Current Status (Y7829): At least 40 percent but less than 60 percent impaired, limited or restricted Mobility: Walking and Moving Around Goal Status [phone]): At least 20 percent but less than 40 percent impaired, limited or restricted    Beth Shiann Kam, PT, DPT X: [phone]

## 2017-03-26 NOTE — NC FL2 (Signed)
MEDICAID FL2 LEVEL OF CARE SCREENING TOOL     IDENTIFICATION  Patient Name: [name] Birthdate: 04/02/1927 Sex: female Admission Date (Current Location): 03/22/2017  [facility] and [place] Number:  [employer] and Address:  [facility],  [address], [place] [address]      Provider Number: [id number]  Attending Physician Name and Address:  Alba Cory, MD  Relative Name and Phone Number:       Current Level of Care: Hospital Recommended Level of Care: Skilled Nursing Facility Prior Approval Number:    Date Approved/Denied:   PASRR Number:   [id number]  Discharge Plan: SNF    Current Diagnoses: Patient Active Problem List   Diagnosis Date Noted  . Sepsis (HCC) 03/22/2017  . Acute lower UTI 03/22/2017  . Community acquired pneumonia 03/22/2017  . Hip fracture (HCC) 07/16/2015  . Aortic heart murmur 07/16/2015  . Chronic CHF (HCC) 07/16/2015  . Essential hypertension 07/16/2015  . Neuropathy 07/16/2015  . Abdominal aortic aneurysm (HCC) 06/17/2012  . Aftercare following surgery of the circulatory system, NEC 06/17/2012    Orientation RESPIRATION BLADDER Height & Weight     Self, Place  Normal Incontinent Weight: 188 lb 11.4 oz (85.6 kg) Height:  5\' 9"  (175.3 cm)  BEHAVIORAL SYMPTOMS/MOOD NEUROLOGICAL BOWEL NUTRITION STATUS      Continent Diet (see DC summary)  AMBULATORY STATUS COMMUNICATION OF NEEDS Skin   Extensive Assist Verbally Normal                       Personal Care Assistance Level of Assistance  Bathing, Feeding, Dressing Bathing Assistance: Maximum assistance Feeding assistance: Limited assistance Dressing Assistance: Maximum assistance     Functional Limitations Info  Sight, Hearing, Speech Sight Info: Adequate Hearing Info: Impaired Speech Info: Adequate    SPECIAL CARE FACTORS FREQUENCY  PT (By licensed PT), OT (By licensed OT)     PT Frequency: 5x a week OT Frequency: 5x a week             Contractures Contractures Info: Not present    Additional Factors Info  Code Status, Allergies Code Status Info: DNR Allergies Info: Sulfa Antibiotics, Cephalexin           Current Medications (03/26/2017):  This is the current hospital active medication list Current Facility-Administered Medications  Medication Dose Route Frequency Provider Last Rate Last Dose  . acetaminophen (TYLENOL) tablet 650 mg  650 mg Oral Q6H PRN Belkys A Regalado, MD   650 mg at 03/23/17 2006   Or  . acetaminophen (TYLENOL) suppository 650 mg  650 mg Rectal Q6H PRN Belkys A Regalado, MD      . albuterol (PROVENTIL) (2.5 MG/3ML) 0.083% nebulizer solution 2.5 mg  2.5 mg Nebulization Q6H PRN Belkys A Regalado, MD      . ALPRAZolam Prudy Feeler) tablet 0.25 mg  0.25 mg Oral QHS PRN Belkys A Regalado, MD      . aspirin EC tablet 325 mg  325 mg Oral Q breakfast Belkys A Regalado, MD   325 mg at 03/25/17 0829  . clindamycin (CLEOCIN) capsule 300 mg  300 mg Oral Q8H Belkys A Regalado, MD      . feeding supplement (ENSURE ENLIVE) (ENSURE ENLIVE) liquid 237 mL  237 mL Oral BID BM Belkys A Regalado, MD   237 mL at 03/25/17 1717  . furosemide (LASIX) tablet 40 mg  40 mg Oral Daily Belkys A Regalado, MD      .  gabapentin (NEURONTIN) capsule 100 mg  100 mg Oral q morning - 10a Belkys A Regalado, MD   100 mg at 03/25/17 1000  . gabapentin (NEURONTIN) capsule 100 mg  100 mg Oral Q1400 Belkys A Regalado, MD   100 mg at 03/25/17 1711  . gabapentin (NEURONTIN) capsule 300 mg  300 mg Oral QHS Belkys A Regalado, MD   300 mg at 03/25/17 2113  . hydrALAZINE (APRESOLINE) injection 10 mg  10 mg Intravenous Q6H PRN Belkys A Regalado, MD   10 mg at 03/24/17 0840  . lisinopril (PRINIVIL,ZESTRIL) tablet 10 mg  10 mg Oral Daily Belkys A Regalado, MD   10 mg at 03/25/17 0958  . metoprolol tartrate (LOPRESSOR) tablet 12.5 mg  12.5 mg Oral BID Belkys A Regalado, MD   12.5 mg at 03/25/17 2113  . nitrofurantoin  (macrocrystal-monohydrate) (MACROBID) capsule 100 mg  100 mg Oral Q12H Belkys A Regalado, MD   100 mg at 03/25/17 2215  . ondansetron (ZOFRAN) tablet 4 mg  4 mg Oral Q6H PRN Belkys A Regalado, MD       Or  . ondansetron (ZOFRAN) injection 4 mg  4 mg Intravenous Q6H PRN Belkys A Regalado, MD      . pantoprazole (PROTONIX) EC tablet 40 mg  40 mg Oral BID Belkys A Regalado, MD   40 mg at 03/25/17 2114  . polyethylene glycol (MIRALAX / GLYCOLAX) packet 17 g  17 g Oral Daily Belkys A Regalado, MD   17 g at 03/25/17 1000  . pravastatin (PRAVACHOL) tablet 40 mg  40 mg Oral QHS Belkys A Regalado, MD   40 mg at 03/25/17 2114  . traMADol (ULTRAM) tablet 50 mg  50 mg Oral Q12H PRN Alba Cory, MD         Discharge Medications: Please see discharge summary for a list of discharge medications.  Relevant Imaging Results:  Relevant Lab Results:   Additional Information SSN:  [id number]  Raye Sorrow, [place]

## 2017-03-26 NOTE — Clinical Social Work Note (Signed)
Clinical Social Work Assessment  Patient Details  Name: [name] MRN: [id number] Date of Birth: [birth year]  Date of referral:  03/26/17               Reason for consult:  Facility Placement, Discharge Planning                Permission sought to share information with:  Case Manager, Magazine features editor, Family Supports Permission granted to share information::  Yes, Verbal Permission Granted  Name::        Agency::     Relationship::  Son POA ,  [name]  Solicitor Information:     Housing/Transportation Living arrangements for the past 2 months:  Single Family Home Source of Information:  Patient, Medical Team, Case Manager, Spouse Patient Interpreter Needed:  None Criminal Activity/Legal Involvement Pertinent to Current Situation/Hospitalization:  No - Comment as needed Significant Relationships:  Adult Children Lives with:  Self Do you feel safe going back to the place where you live?  No Need for family participation in patient care:  Yes (Comment) (POA son)  Care giving concerns:   Patient lives alone at home with increased weakness and confusion. Son is POA and reports he checks in on her, but at this time needs more care than he can give, thus agreeable to recommendation for SNF.  Patient very confused at this time and unable to manage at home safely.   Social Worker assessment / plan:  LCSW completed SNF work up along with assessment. Insurance has been started/initiated.  Family is hopeful for [facility] as she has completed ST rehab in the past.  Employment status:  Retired Database administrator PT Recommendations:  Skilled Nursing Facility Information / Referral to community resources:  Skilled Nursing Facility  Patient/Family's Response to care:  Agreeable to plan  Patient/Family's Understanding of and Emotional Response to Diagnosis, Current Treatment, and Prognosis:  Understanding of current recommendation and needs.  Son involved  involved in care.  Emotional Assessment Appearance:  Appears stated age Attitude/Demeanor/Rapport:    Affect (typically observed):  Accepting, Adaptable, Pleasant Orientation:  Oriented to Self, Oriented to Place Alcohol / Substance use:  Not Applicable Psych involvement (Current and /or in the community):  No (Comment)  Discharge Needs  Concerns to be addressed:  No discharge needs identified Readmission within the last 30 days:  No Current discharge risk:  None Barriers to Discharge:  English as a second language teacher, Continued Medical Work up   Raye Sorrow, LCSW 03/26/2017, 10:23 AM

## 2017-03-26 NOTE — Progress Notes (Signed)
At 2100 pt was aggressive and attempting to get out of bed. Pt confused and thinks she at home. MD paged and 1mg  Ativan PO ordered and given. Ativan ineffective, and pt continues to get out of bed. Nurse tech Tish in to sit with pt. Pt consistently throws legs over side rails and tries to get up and pulling at PICC line and PIV, when nurse and tech gets pt back in bed she becomes physically aggressive. MD paged and orders 5 mg Haldol IV and non-violent restraints. Haldol given at 0056. At 0100 soft mitten restraints applied and reiterated to pt the importance of staying in bed. Pt was able to remove soft mitten to left hand twice. At 0200 non-violent restraints applied to left and right wrist. Nurse tech at bedside, pt verbally and physically aggressive. At 0300, pt asleep, will continue to monitor.

## 2017-03-26 NOTE — Clinical Social Work Placement (Signed)
   CLINICAL SOCIAL WORK PLACEMENT  NOTE  Date:  03/26/2017  Patient Details  Name: [name] MRN: [id number] Date of Birth: 02/24/1927  Clinical Social Work is seeking post-discharge placement for this patient at the Skilled  Nursing Facility level of care (*CSW will initial, date and re-position this form in  chart as items are completed):  Yes   Patient/family provided with [facility] Clinical Social Work Department's list of facilities offering this level of care within the geographic area requested by the patient (or if unable, by the patient's family).  Yes   Patient/family informed of their freedom to choose among providers that offer the needed level of care, that participate in Medicare, Medicaid or managed care program needed by the patient, have an available bed and are willing to accept the patient.  Yes   Patient/family informed of [facility]'s ownership interest in [facility] and [facility] as well as of the fact that they are under no obligation to receive care at these facilities.  PASRR submitted to EDS on       PASRR number received on       Existing PASRR number confirmed on 03/26/17     FL2 transmitted to all facilities in geographic area requested by pt/family on 03/26/17     FL2 transmitted to all facilities within larger geographic area on       Patient informed that his/her managed care company has contracts with or will negotiate with certain facilities, including the following:            Patient/family informed of bed offers received.  Patient chooses bed at       Physician recommends and patient chooses bed at      Patient to be transferred to   on  .  Patient to be transferred to facility by       Patient family notified on   of transfer.  Name of family member notified:        PHYSICIAN Please sign FL2, Please sign DNR     Additional Comment:    _______________________________________________ Raye Sorrow,  LCSW 03/26/2017, 10:15 AM

## 2017-03-26 NOTE — Progress Notes (Signed)
PROGRESS NOTE    [name]  VWU:[id number] DOB: 06-20-1927 DOA: 03/22/2017 PCP: Dwana Melena, MD    Brief Narrative: [name] is a 83 y.o. female with medical history significant of HTN, HLD, neuropathy, AAA, who presents with fever, chills, dysuria. Patient is hard of hearing. She is slowly answering questions. She denies pain. History obtain from son, he found her on the floor. Her head was on a soft couch. Patient denies any pain, dyspnea. Patient has been notice to be coughing. She has been complaining of dysuria.   ED Course: Patinet found to be febrile tempeture at 101, lactic acid 2.8, WBC 14, UA with too numerous to count WBC, Chest x ray: Patchy airspace opacity in the lingula concerning for bronchopneumonia given the clinical history of fever and chills. Followup PA and lateral chest X-ray is recommended in 3-4 weeks. following trial of antibiotic therapy to ensure resolution and exclude underlying malignancy.Diffuse mild interstitial prominence likely representing mild chronic CHF.   Assessment & Plan:   Active Problems:   Chronic CHF (HCC)   Essential hypertension   Neuropathy   Sepsis (HCC)   Acute lower UTI   Community acquired pneumonia   1-Sepsis; Secondary to UTI, PNA.  Presents with fevers, leukocytosis, UA with too numerous to count WBC. Chest x ray; air space opacity in the lingula.  Received 3 days of  IV Levaquin and Aztreonam.. Discontinue vancomycin. MRSA PCR negative Follows Blood culture; no growth to date., urine culture growing E coli,  Lactic acid trending down.  Start microbid for E coli UTI.  Levaquin change to clindamycin (patient more confused, would like to avoid using levaquin)  to tx PNA>   2-Neuropathy; continue with gabapentin, home dose.   3-HTN;Continue with lasix, lisinopril.   4-Acute on Chronic Diastolic HF; mild exacerbation Received IV lasix yesterday.  Resume oral lasix today, 40 mg daily   5-Transaminases;mildly  increase AST.  Hepatitis panel negative.   6-Acute Hypoxic Respiratory Failure.  In setting of PNA.  Incentive spirometry. Continue antibiotics.  Nebulizer.   7-Thrombocytopenia; chronic.  Follow trend. Stable.   8-Dysphagia;  Difficulty swallowing solid.  Esophagogram ordered.   Speech swallow evaluation. Recommending regular thin diet.  Continue with Protonix.   9-Hypokalemia;  Resolved.   10-Acute encephalopathy, delirium Was agitated last night/ sleepy this morning , suspect sleepy due to meds.  Check ammonia level.   DVT prophylaxis: SCD, due to thrombocytopenia.  Code Status: DNR Family Communication: Son who was at bedside.  Disposition Plan: SNF in 24 hours if stable.  Consultants:   none   Procedures: none  Antimicrobials:  Levaquin, aztreonam.    Subjective: She is sleepy, denies dyspnea. Was agitated last night and received ativan and haldol.  Per son she takes xanax at bed tome PRN.   Objective: Vitals:   03/25/17 0554 03/25/17 1415 03/25/17 2110 03/26/17 0650  BP: (!) 151/61 129/72 (!) 124/50 (!) 124/49  Pulse: 87 78 81 70  Resp: 18 18 20 13   Temp: 98.7 F (37.1 C) 98.2 F (36.8 C) 98.4 F (36.9 C) 98.2 F (36.8 C)  TempSrc: Oral Oral Oral Oral  SpO2: 92% 95% 96% 97%  Weight: 85.6 kg (188 lb 11.4 oz)     Height:        Intake/Output Summary (Last 24 hours) at 03/26/17 1008 Last data filed at 03/25/17 1700  Gross per 24 hour  Intake              480  ml  Output                0 ml  Net              480 ml   Filed Weights   03/23/17 0500 03/24/17 0400 03/25/17 0554  Weight: 77.8 kg (171 lb 8.3 oz) 84.9 kg (187 lb 2.7 oz) 85.6 kg (188 lb 11.4 oz)    Examination:  General exam; sleepy, follows command.  Respiratory system; Bilateral wheezing.  Cardiovascular system: S1 & S2 heard, RRR. No pedal edema. Gastrointestinal system: soft, NT, No distended.  Central nervous system: non focal.  Extremities: normal power.  Skin: No  rashes, lesions or ulcers Psychiatry: Mood and affect appropriate.     Data Reviewed: I have personally reviewed following labs and imaging studies  CBC:  Recent Labs Lab 03/22/17 1156 03/23/17 0412 03/24/17 0430 03/25/17 0735  WBC 14.3* 6.9 7.2 7.7  NEUTROABS 13.2*  --   --   --   HGB 12.7 11.5* 12.6 12.2  HCT 36.4 33.5* 35.1* 35.0*  MCV 96.3 97.7 96.2 95.9  PLT 84* 80* 71* 85*   Basic Metabolic Panel:  Recent Labs Lab 03/22/17 1156 03/23/17 0412 03/24/17 0430 03/25/17 0735 03/26/17 0800  NA 137 140 136 135 136  K 3.5 3.8 3.4* 3.9 3.8  CL 106 113* 110 107 106  CO2 22 20* 21* 22 26  GLUCOSE 105* 104* 99 96 88  BUN 20 30* 20 20 19   CREATININE 0.77 0.82 0.62 0.58 0.66  CALCIUM 8.8* 8.1* 7.9* 8.0* 8.1*   GFR: Estimated Creatinine Clearance: 56.8 mL/min (by C-G formula based on SCr of 0.66 mg/dL). Liver Function Tests:  Recent Labs Lab 03/22/17 1156 03/23/17 0412  AST 88* 92*  ALT 23 25  ALKPHOS 80 58  BILITOT 2.9* 1.8*  PROT 6.6 5.3*  ALBUMIN 2.8* 2.2*   No results for input(s): LIPASE, AMYLASE in the last 168 hours. No results for input(s): AMMONIA in the last 168 hours. Coagulation Profile: No results for input(s): INR, PROTIME in the last 168 hours. Cardiac Enzymes: No results for input(s): CKTOTAL, CKMB, CKMBINDEX, TROPONINI in the last 168 hours. BNP (last 3 results) No results for input(s): PROBNP in the last 8760 hours. HbA1C: No results for input(s): HGBA1C in the last 72 hours. CBG: No results for input(s): GLUCAP in the last 168 hours. Lipid Profile: No results for input(s): CHOL, HDL, LDLCALC, TRIG, CHOLHDL, LDLDIRECT in the last 72 hours. Thyroid Function Tests: No results for input(s): TSH, T4TOTAL, FREET4, T3FREE, THYROIDAB in the last 72 hours. Anemia Panel: No results for input(s): VITAMINB12, FOLATE, FERRITIN, TIBC, IRON, RETICCTPCT in the last 72 hours. Sepsis Labs:  Recent Labs Lab 03/22/17 1156 03/22/17 1449 03/22/17 1827  03/22/17 2055  LATICACIDVEN 2.8* 2.0* 3.7* 2.3*    Recent Results (from the past 240 hour(s))  Culture, blood (routine x 2)     Status: None (Preliminary result)   Collection Time: 03/22/17 11:56 AM  Result Value Ref Range Status   Specimen Description BLOOD RIGHT ARM  Final   Special Requests   Final    BOTTLES DRAWN AEROBIC AND ANAEROBIC Blood Culture adequate volume   Culture NO GROWTH 4 DAYS  Final   Report Status PENDING  Incomplete  Culture, blood (routine x 2)     Status: None (Preliminary result)   Collection Time: 03/22/17 12:14 PM  Result Value Ref Range Status   Specimen Description BLOOD LEFT ANTECUBITAL  Final  Special Requests   Final    BOTTLES DRAWN AEROBIC AND ANAEROBIC Blood Culture adequate volume Blood Culture results may not be optimal due to an inadequate volume of blood received in culture bottles   Culture NO GROWTH 4 DAYS  Final   Report Status PENDING  Incomplete  Urine culture     Status: Abnormal (Preliminary result)   Collection Time: 03/22/17  1:30 PM  Result Value Ref Range Status   Specimen Description URINE, RANDOM  Final   Special Requests NONE  Final   Culture (A)  Final    >=100,000 COLONIES/mL ESCHERICHIA COLI CULTURE REINCUBATED FOR BETTER GROWTH Performed at [facility], [address], [place], [place] [address]    Report Status PENDING  Incomplete   Organism ID, Bacteria ESCHERICHIA COLI (A)  Final      Susceptibility   Escherichia coli - MIC*    AMPICILLIN >=32 RESISTANT Resistant     CEFAZOLIN <=4 SENSITIVE Sensitive     CEFTRIAXONE <=1 SENSITIVE Sensitive     CIPROFLOXACIN >=4 RESISTANT Resistant     GENTAMICIN <=1 SENSITIVE Sensitive     IMIPENEM <=0.25 SENSITIVE Sensitive     NITROFURANTOIN <=16 SENSITIVE Sensitive     TRIMETH/SULFA >=320 RESISTANT Resistant     AMPICILLIN/SULBACTAM 16 INTERMEDIATE Intermediate     PIP/TAZO <=4 SENSITIVE Sensitive     Extended ESBL NEGATIVE Sensitive     * >=100,000 COLONIES/mL  ESCHERICHIA COLI  MRSA PCR Screening     Status: None   Collection Time: 03/22/17  2:59 PM  Result Value Ref Range Status   MRSA by PCR NEGATIVE NEGATIVE Final    Comment:        The GeneXpert MRSA Assay (FDA approved for NASAL specimens only), is one component of a comprehensive MRSA colonization surveillance program. It is not intended to diagnose MRSA infection nor to guide or monitor treatment for MRSA infections.          Radiology Studies: No results found.      Scheduled Meds: . aspirin  325 mg Oral Q breakfast  . clindamycin  300 mg Oral Q8H  . feeding supplement (ENSURE ENLIVE)  237 mL Oral BID BM  . furosemide  40 mg Oral Daily  . gabapentin  100 mg Oral q morning - 10a  . gabapentin  100 mg Oral Q1400  . gabapentin  300 mg Oral QHS  . lisinopril  10 mg Oral Daily  . metoprolol tartrate  12.5 mg Oral BID  . nitrofurantoin (macrocrystal-monohydrate)  100 mg Oral Q12H  . pantoprazole  40 mg Oral BID  . polyethylene glycol  17 g Oral Daily  . pravastatin  40 mg Oral QHS   Continuous Infusions:    LOS: 4 days    Time spent: 35 minutes.     Alba Cory, MD [facility] Pager [phone]  If 7PM-7AM, please contact night-coverage [email or web address] Password [email or web address] 03/26/2017, 10:08 AM

## 2017-03-26 NOTE — Progress Notes (Signed)
Pt resting comfortably right now, restraints released. Will continue to monitor.

## 2017-03-26 NOTE — Progress Notes (Signed)
Restraints removed by previous shift at 0600.  Patient not combative at this time.  No need for restraints.

## 2017-03-26 NOTE — Care Management Note (Signed)
Case Management Note  Patient Details  Name: [name] MRN: [id number] Date of Birth: 11/18/1927  Subjective/Objective:                  Pt admitted with sepsis. She is from home, lives alone. Presently she is confused. PT has recommended SNF. CSW aware.   Action/Plan: CSW making arrangements for placement. CM will cont to follow for DC planning needs.   Expected Discharge Date:  03/25/17               Expected Discharge Plan:  Skilled Nursing Facility  In-House Referral:  Clinical Social Work  Discharge planning Services  CM Consult  Post Acute Care Choice:  NA Choice offered to:  NA  Status of Service:  In process, will continue to follow  Malcolm Metro, RN 03/26/2017, 1:50 PM

## 2017-03-26 NOTE — Progress Notes (Signed)
Non-violent restraint applied to wrist, pt combative and attempting to get out of bed. Will continue to monitor.

## 2017-03-27 ENCOUNTER — Inpatient Hospital Stay (HOSPITAL_COMMUNITY): Payer: Medicare Other

## 2017-03-27 ENCOUNTER — Inpatient Hospital Stay
Admission: RE | Admit: 2017-03-27 | Discharge: 2017-04-13 | Disposition: A | Discharge status: Home / Self Care | Payer: Medicare Other | Source: Ambulatory Visit | Attending: Internal Medicine | Admitting: Internal Medicine

## 2017-03-27 DIAGNOSIS — N390 Urinary tract infection, site not specified: Secondary | ICD-10-CM | POA: Diagnosis not present

## 2017-03-27 DIAGNOSIS — E785 Hyperlipidemia, unspecified: Secondary | ICD-10-CM | POA: Diagnosis not present

## 2017-03-27 DIAGNOSIS — K449 Diaphragmatic hernia without obstruction or gangrene: Secondary | ICD-10-CM | POA: Diagnosis not present

## 2017-03-27 DIAGNOSIS — I10 Essential (primary) hypertension: Secondary | ICD-10-CM | POA: Diagnosis not present

## 2017-03-27 DIAGNOSIS — M6281 Muscle weakness (generalized): Secondary | ICD-10-CM | POA: Diagnosis not present

## 2017-03-27 DIAGNOSIS — J181 Lobar pneumonia, unspecified organism: Secondary | ICD-10-CM | POA: Diagnosis not present

## 2017-03-27 DIAGNOSIS — B9620 Unspecified Escherichia coli [E. coli] as the cause of diseases classified elsewhere: Secondary | ICD-10-CM | POA: Diagnosis not present

## 2017-03-27 DIAGNOSIS — K224 Dyskinesia of esophagus: Secondary | ICD-10-CM | POA: Diagnosis not present

## 2017-03-27 DIAGNOSIS — Z9181 History of falling: Secondary | ICD-10-CM | POA: Diagnosis not present

## 2017-03-27 DIAGNOSIS — K219 Gastro-esophageal reflux disease without esophagitis: Secondary | ICD-10-CM | POA: Diagnosis not present

## 2017-03-27 DIAGNOSIS — I714 Abdominal aortic aneurysm, without rupture: Secondary | ICD-10-CM | POA: Diagnosis not present

## 2017-03-27 DIAGNOSIS — G629 Polyneuropathy, unspecified: Secondary | ICD-10-CM | POA: Diagnosis not present

## 2017-03-27 DIAGNOSIS — I5032 Chronic diastolic (congestive) heart failure: Secondary | ICD-10-CM | POA: Diagnosis not present

## 2017-03-27 LAB — CULTURE, BLOOD (ROUTINE X 2)
CULTURE: NO GROWTH
Culture: NO GROWTH
Special Requests: ADEQUATE

## 2017-03-27 LAB — URINE CULTURE

## 2017-03-27 LAB — AMMONIA: AMMONIA: 29 umol/L (ref 9–35)

## 2017-03-27 MED ORDER — METOPROLOL TARTRATE 25 MG PO TABS: 12.5000 mg | ORAL_TABLET | Freq: Two times a day (BID) | ORAL | 0 refills | Status: DC

## 2017-03-27 MED ORDER — ALBUTEROL SULFATE (2.5 MG/3ML) 0.083% IN NEBU: 2.5000 mg | INHALATION_SOLUTION | Freq: Four times a day (QID) | RESPIRATORY_TRACT | 12 refills | Status: DC

## 2017-03-27 MED ORDER — ALPRAZOLAM 0.25 MG PO TABS: 0.2500 mg | ORAL_TABLET | Freq: Every evening | ORAL | 0 refills | Status: DC | PRN

## 2017-03-27 MED ORDER — LACTULOSE 10 GM/15ML PO SOLN: 30.0000 g | Freq: Every day | ORAL | 0 refills | Status: DC

## 2017-03-27 MED ORDER — ENSURE ENLIVE PO LIQD: 237.0000 mL | Freq: Two times a day (BID) | ORAL | 12 refills | Status: DC

## 2017-03-27 MED ORDER — CLINDAMYCIN HCL 300 MG PO CAPS: 300.0000 mg | ORAL_CAPSULE | Freq: Three times a day (TID) | ORAL | 0 refills | Status: DC

## 2017-03-27 MED ORDER — PANTOPRAZOLE SODIUM 40 MG PO TBEC
40.0000 mg | DELAYED_RELEASE_TABLET | Freq: Two times a day (BID) | ORAL | 0 refills | Status: DC
Start: 2017-03-27 — End: 2019-05-24

## 2017-03-27 MED ORDER — NITROFURANTOIN MONOHYD MACRO 100 MG PO CAPS: 100.0000 mg | ORAL_CAPSULE | Freq: Two times a day (BID) | ORAL | 0 refills | Status: DC

## 2017-03-27 MED ORDER — DILTIAZEM HCL ER COATED BEADS 120 MG PO CP24: 120.0000 mg | ORAL_CAPSULE | Freq: Every day | ORAL | 0 refills | Status: DC

## 2017-03-27 NOTE — Care Management Important Message (Signed)
Important Message  Patient Details  Name: [name] MRN: [id number] Date of Birth: 02-11-1927   Medicare Important Message Given:  Yes    Malcolm Metro, RN 03/27/2017, 11:49 AM

## 2017-03-27 NOTE — Progress Notes (Signed)
Report called to Selena Batten at [facility]

## 2017-03-27 NOTE — Clinical Social Work Placement (Signed)
   CLINICAL SOCIAL WORK PLACEMENT  NOTE  Date:  03/27/2017  Patient Details  Name: [name] MRN: [id number] Date of Birth: Jan 23, 1927  Clinical Social Work is seeking post-discharge placement for this patient at the Skilled  Nursing Facility level of care (*CSW will initial, date and re-position this form in  chart as items are completed):  Yes   Patient/family provided with [facility] Clinical Social Work Department's list of facilities offering this level of care within the geographic area requested by the patient (or if unable, by the patient's family).  Yes   Patient/family informed of their freedom to choose among providers that offer the needed level of care, that participate in Medicare, Medicaid or managed care program needed by the patient, have an available bed and are willing to accept the patient.  Yes   Patient/family informed of [facility]'s ownership interest in [facility] and [facility], as well as of the fact that they are under no obligation to receive care at these facilities.  PASRR submitted to EDS on       PASRR number received on       Existing PASRR number confirmed on 03/26/17     FL2 transmitted to all facilities in geographic area requested by pt/family on 03/26/17     FL2 transmitted to all facilities within larger geographic area on       Patient informed that his/her managed care company has contracts with or will negotiate with certain facilities, including the following:        Yes   Patient/family informed of bed offers received.  Patient chooses bed at [facility]     Physician recommends and patient chooses bed at      Patient to be transferred to [facility] on 03/27/17.  Patient to be transferred to facility by [facility] Staff     Patient family notified on 03/27/17 of transfer.  Name of family member notified:  daughter (message left), son at bedside.       PHYSICIAN       Additional Comment: LCSW notified  Kerri at [facility] of discharge. LCSW sent clinicals to [facility]. LCSW signing off.     _______________________________________________ Annice Needy, LCSW 03/27/2017, 1:22 PM

## 2017-03-27 NOTE — Care Management Note (Signed)
Case Management Note  Patient Details  Name: [name] MRN: [id number] Date of Birth: 08/29/1927  Expected Discharge Date:  03/27/17               Expected Discharge Plan:  Skilled Nursing Facility  In-House Referral:  Clinical Social Work  Discharge planning Services  CM Consult  Post Acute Care Choice:  NA Choice offered to:  NA  Status of Service:  Completed, signed off  If discussed at [employer] of Stay Meetings, dates discussed:  03/27/2017  Additional Comments: Pt discharging to SNF today. CSW aware of DC plan and has made arrangements for placement. No CM needs at this time.   Malcolm Metro, RN 03/27/2017, 11:49 AM

## 2017-03-27 NOTE — Progress Notes (Signed)
[name] discharged Skilled nursing facility per MD order.  Discharge instructions reviewed and discussed with the patient and son, all questions and concerns answered. Copy of instructions and scripts sent with pt to [facility]. Report previously called to [name] at [facility].  Allergies as of 03/27/2017      Reactions   Sulfa Antibiotics Anaphylaxis   Cephalexin Itching, Rash      Medication List    STOP taking these medications   HYDROcodone-acetaminophen 5-325 MG tablet Commonly known as:  NORCO/VICODIN   methocarbamol 500 MG tablet Commonly known as:  ROBAXIN   polyethylene glycol packet Commonly known as:  MIRALAX / GLYCOLAX   pravastatin 40 MG tablet Commonly known as:  PRAVACHOL     TAKE these medications   albuterol (2.5 MG/3ML) 0.083% nebulizer solution Commonly known as:  PROVENTIL Take 3 mLs (2.5 mg total) by nebulization every 6 (six) hours.   ALPRAZolam 0.25 MG tablet Commonly known as:  XANAX Take 1 tablet (0.25 mg total) by mouth at bedtime as needed for anxiety or sleep.   aspirin 325 MG EC tablet Take 1 tablet (325 mg total) by mouth daily with breakfast.   clindamycin 300 MG capsule Commonly known as:  CLEOCIN Take 1 capsule (300 mg total) by mouth every 8 (eight) hours.   diltiazem 120 MG 24 hr capsule Commonly known as:  CARDIZEM CD Take 1 capsule (120 mg total) by mouth daily.   feeding supplement (ENSURE ENLIVE) Liqd Take 237 mLs by mouth 2 (two) times daily between meals.   furosemide 20 MG tablet Commonly known as:  LASIX Take 20 mg by mouth daily.   gabapentin 100 MG capsule Commonly known as:  NEURONTIN Take 100-300 mg by mouth 3 (three) times daily.   lactulose 10 GM/15ML solution Commonly known as:  CHRONULAC Take 45 mLs (30 g total) by mouth daily.   lisinopril 10 MG tablet Commonly known as:  PRINIVIL,ZESTRIL Take 10 mg by mouth daily.   nitrofurantoin (macrocrystal-monohydrate) 100 MG capsule Commonly known as:   MACROBID Take 1 capsule (100 mg total) by mouth every 12 (twelve) hours.   pantoprazole 40 MG tablet Commonly known as:  PROTONIX Take 1 tablet (40 mg total) by mouth 2 (two) times daily.   potassium chloride 10 MEQ tablet Commonly known as:  K-DUR Take 10 mEq by mouth daily.   traMADol 50 MG tablet Commonly known as:  ULTRAM Take 1 tablet (50 mg total) by mouth every 12 (twelve) hours as needed for moderate pain.       Patients skin is clean, dry and intact, no evidence of skin break down. IV site discontinued and catheter remains intact. Site without signs and symptoms of complications. Dressing and pressure applied.  Patient escorted to [facility] by NT in a wheelchair,  no distress noted upon discharge.  [name] 03/27/2017 5:44 PM

## 2017-03-27 NOTE — Discharge Summary (Signed)
Physician Discharge Summary  [name] [name]:[id number] DOB: 12-10-1926 DOA: 03/22/2017  PCP: Dwana Melena, MD  Admit date: 03/22/2017 Discharge date: 03/27/2017  Admitted From: Home  Disposition:  SNF  Recommendations for Outpatient Follow-up:  1. Follow up with PCP in 1-2 weeks 2. Please obtain BMP/CBC in one week 3. Recommend small, frequent meal. Needs follow up with Gastroenterologist for transaminases and esophageal dysmotility disorder.  4.   Discharge Condition: Stable.  CODE STATUS: DNR Diet recommendation: Heart Healthy , small frequent meals   Brief/Interim Summary: Kellie I Rapeis a 83 y.o.femalewith medical history significant of HTN, HLD, neuropathy, AAA, who presents with fever, chills, dysuria. Patient is hard of hearing. She is slowly answering questions. She denies pain. History obtain from son, he found her on the floor. Her head was on a soft couch. Patient denies any pain, dyspnea. Patient has been notice to be coughing. She has been complaining of dysuria.   ED Course:Patinet found to be febrile tempeture at 101, lactic acid 2.8, WBC 14, UA with too numerous to count WBC, Chest x ray: Patchy airspace opacity in the lingula concerning for bronchopneumonia given the clinical history of fever and chills. Followup PA and lateral chest X-ray is recommended in 3-4 weeks. following trial of antibiotic therapy to ensure resolution and exclude underlying malignancy.Diffuse mild interstitial prominence likely representing mild chronic CHF.   Assessment & Plan: 1-Sepsis; Secondary to UTI, PNA.  Presents with fevers, leukocytosis, UA with too numerous to count WBC. Chest x ray; air space opacity in the lingula.  Received 3 days of  IV Levaquin and Aztreonam.. Discontinue vancomycin. MRSA PCR negative Follows Blood culture; no growth to date., urine culture growing E coli,  Lactic acid trending down.  Started  microbid for E coli UTI. Need 3 more day to complete 7 days.   Levaquin change to clindamycin (patient more confused, would like to avoid using levaquin)  to tx PNA> need 2 more days of antibiotics for PNA>   2-Neuropathy;continue with gabapentin, home dose.   3-HTN;Continue with lasix, lisinopril.   4-Acute on Chronic Diastolic HF; mild exacerbation Received IV lasix yesterday.  Resume home dose of lasix.   5-Transaminases;mildly increase AST. Increase bilirubin. Per record has elevation in the past. Suspect some component of liver diseases. Needs follow up with gastroenterologist . Will discontinue statin.  Hepatitis panel negative.  Discharge on lactulose.   6-Acute Hypoxic Respiratory Failure. In setting of PNA.  Incentive spirometry. Continue antibiotics. For 2 more days./  Discharge on clindamycin  Nebulizer.   7-Thrombocytopenia; chronic.  Follow trend. Stable.   8-Dysphagia;  Difficulty swallowing solid.  Esophagogram; Esophageal dysmotility with tertiary contractions. Large hiatal hernia.  Started on Cardizem.  Continue with Protonix.  Small frequent meals.  Needs follow up with GI   9-Hypokalemia;  Resolved.   10-Acute encephalopathy, delirium secondary to infection, vs component of hepatic encephalopathy  Was agitated  During the night during hospitalization.   Ammonia mildly elevated. Started on lactulose. Ammonia has decreased. Marland Kitchen    Discharge Diagnoses:  Active Problems:   Chronic CHF (HCC)   Essential hypertension   Neuropathy   Sepsis (HCC)   Acute lower UTI   Community acquired pneumonia    Discharge Instructions  Discharge Instructions    Diet - low sodium heart healthy    Complete by:  As directed    Increase activity slowly    Complete by:  As directed      Allergies as of 03/27/2017  Reactions   Sulfa Antibiotics Anaphylaxis   Cephalexin Itching, Rash      Medication List    STOP taking these medications   HYDROcodone-acetaminophen 5-325 MG tablet Commonly known as:   NORCO/VICODIN   methocarbamol 500 MG tablet Commonly known as:  ROBAXIN   polyethylene glycol packet Commonly known as:  MIRALAX / GLYCOLAX   pravastatin 40 MG tablet Commonly known as:  PRAVACHOL     TAKE these medications   albuterol (2.5 MG/3ML) 0.083% nebulizer solution Commonly known as:  PROVENTIL Take 3 mLs (2.5 mg total) by nebulization every 6 (six) hours.   ALPRAZolam 0.25 MG tablet Commonly known as:  XANAX Take 1 tablet (0.25 mg total) by mouth at bedtime as needed for anxiety or sleep.   aspirin 325 MG EC tablet Take 1 tablet (325 mg total) by mouth daily with breakfast.   clindamycin 300 MG capsule Commonly known as:  CLEOCIN Take 1 capsule (300 mg total) by mouth every 8 (eight) hours.   diltiazem 120 MG 24 hr capsule Commonly known as:  CARDIZEM CD Take 1 capsule (120 mg total) by mouth daily.   feeding supplement (ENSURE ENLIVE) Liqd Take 237 mLs by mouth 2 (two) times daily between meals.   furosemide 20 MG tablet Commonly known as:  LASIX Take 20 mg by mouth daily.   gabapentin 100 MG capsule Commonly known as:  NEURONTIN Take 100-300 mg by mouth 3 (three) times daily.   lactulose 10 GM/15ML solution Commonly known as:  CHRONULAC Take 45 mLs (30 g total) by mouth daily.   lisinopril 10 MG tablet Commonly known as:  PRINIVIL,ZESTRIL Take 10 mg by mouth daily.   nitrofurantoin (macrocrystal-monohydrate) 100 MG capsule Commonly known as:  MACROBID Take 1 capsule (100 mg total) by mouth every 12 (twelve) hours.   pantoprazole 40 MG tablet Commonly known as:  PROTONIX Take 1 tablet (40 mg total) by mouth 2 (two) times daily.   potassium chloride 10 MEQ tablet Commonly known as:  K-DUR Take 10 mEq by mouth daily.   traMADol 50 MG tablet Commonly known as:  ULTRAM Take 1 tablet (50 mg total) by mouth every 12 (twelve) hours as needed for moderate pain.       Allergies  Allergen Reactions  . Sulfa Antibiotics Anaphylaxis  .  Cephalexin Itching and Rash    Consultations:  none   Procedures/Studies: Dg Chest 2 View  Result Date: 03/22/2017 CLINICAL DATA:  83 year old female with thoracic spine pain after falling earlier today. Additionally, patient has fever and chills. EXAM: CHEST  2 VIEW COMPARISON:  Prior chest x-ray 07/15/2015 FINDINGS: Stable cardiomegaly. Atherosclerotic calcifications present in the transverse aorta. Patchy airspace opacity in the lingula obscures the cardiac margin. Diffuse mild interstitial prominence with peripheral Kerley B-lines suggests chronic CHF. Overall, the degree of pulmonary vascular congestion is less than seen on prior chest x-rays. Extremely limited visualization of the thoracic spine secondary to diffuse demineralization of the osseous structures. No pneumothorax or pleural effusion. Mild lingular atelectasis. IMPRESSION: 1. Patchy airspace opacity in the lingula concerning for bronchopneumonia given the clinical history of fever and chills. Followup PA and lateral chest X-ray is recommended in 3-4 weeks following trial of antibiotic therapy to ensure resolution and exclude underlying malignancy. 2. Stable cardiomegaly and aortic atherosclerosis. Aortic Atherosclerosis (ICD10-170.0) 3. Diffuse mild interstitial prominence likely representing mild chronic CHF. The degree of vascular congestion is less than seen on 07/15/2015. 4. Limited evaluation of the osseous structures secondary to advanced demineralization.  No definite fracture. Electronically Signed   By: Malachy Moan M.D.   On: 03/22/2017 13:27   Dg Esophagus  Result Date: 03/27/2017 CLINICAL DATA:  Dysphagia, difficulty swallowing. EXAM: ESOPHOGRAM/BARIUM SWALLOW TECHNIQUE: Single contrast examination was performed using  thin barium. FLUOROSCOPY TIME:  Fluoroscopy Time:  54 seconds Radiation Exposure Index (if provided by the fluoroscopic device): Number of Acquired Spot Images: 0 COMPARISON:  None. FINDINGS: Fluoroscopic  evaluation of swallowing demonstrates disruption of primary esophageal peristaltic waves. Tertiary contractions in the mid and lower esophagus. No fixed stricture, fold thickening or mass. Large hiatal hernia noted. Spontaneous gastroesophageal reflux noted during the procedure. IMPRESSION: Esophageal dysmotility with tertiary contractions. No fixed stricture. Large hiatal hernia. Spontaneous reflux. Electronically Signed   By: Charlett Nose M.D.   On: 03/27/2017 11:05      Subjective: She is alert, doing better per son who was at bedside.    Discharge Exam: Vitals:   03/26/17 2300 03/27/17 0626  BP: 122/61 (!) 153/59  Pulse: 72 75  Resp: 16   Temp: 98.9 F (37.2 C) 97.9 F (36.6 C)   Vitals:   03/26/17 2300 03/27/17 0500 03/27/17 0626 03/27/17 0737  BP: 122/61  (!) 153/59   Pulse: 72  75   Resp: 16     Temp: 98.9 F (37.2 C)  97.9 F (36.6 C)   TempSrc: Oral  Oral   SpO2: 91%  95% 93%  Weight:  80 kg (176 lb 6 oz)    Height:        General: Pt is alert, awake, not in acute distress Cardiovascular: RRR, S1/S2 +, no rubs, no gallops Respiratory: CTA bilaterally, no wheezing, no rhonchi Abdominal: Soft, NT, ND, bowel sounds + Extremities: no edema, no cyanosis    The results of significant diagnostics from this hospitalization (including imaging, microbiology, ancillary and laboratory) are listed below for reference.     Microbiology: Recent Results (from the past 240 hour(s))  Culture, blood (routine x 2)     Status: None   Collection Time: 03/22/17 11:56 AM  Result Value Ref Range Status   Specimen Description BLOOD RIGHT ARM  Final   Special Requests   Final    BOTTLES DRAWN AEROBIC AND ANAEROBIC Blood Culture adequate volume   Culture NO GROWTH 5 DAYS  Final   Report Status 03/27/2017 FINAL  Final  Culture, blood (routine x 2)     Status: None   Collection Time: 03/22/17 12:14 PM  Result Value Ref Range Status   Specimen Description BLOOD LEFT ANTECUBITAL   Final   Special Requests   Final    BOTTLES DRAWN AEROBIC AND ANAEROBIC Blood Culture adequate volume Blood Culture results may not be optimal due to an inadequate volume of blood received in culture bottles   Culture NO GROWTH 5 DAYS  Final   Report Status 03/27/2017 FINAL  Final  Urine culture     Status: Abnormal   Collection Time: 03/22/17  1:30 PM  Result Value Ref Range Status   Specimen Description URINE, RANDOM  Final   Special Requests NONE  Final   Culture (A)  Final    >=100,000 COLONIES/mL ESCHERICHIA COLI >=100,000 COLONIES/mL AEROCOCCUS URINAE    Report Status 03/27/2017 FINAL  Final   Organism ID, Bacteria ESCHERICHIA COLI (A)  Final      Susceptibility   Escherichia coli - MIC*    AMPICILLIN >=32 RESISTANT Resistant     CEFAZOLIN <=4 SENSITIVE Sensitive     CEFTRIAXONE <=  1 SENSITIVE Sensitive     CIPROFLOXACIN >=4 RESISTANT Resistant     GENTAMICIN <=1 SENSITIVE Sensitive     IMIPENEM <=0.25 SENSITIVE Sensitive     NITROFURANTOIN <=16 SENSITIVE Sensitive     TRIMETH/SULFA >=320 RESISTANT Resistant     AMPICILLIN/SULBACTAM 16 INTERMEDIATE Intermediate     PIP/TAZO <=4 SENSITIVE Sensitive     Extended ESBL NEGATIVE Sensitive     * >=100,000 COLONIES/mL ESCHERICHIA COLI  MRSA PCR Screening     Status: None   Collection Time: 03/22/17  2:59 PM  Result Value Ref Range Status   MRSA by PCR NEGATIVE NEGATIVE Final    Comment:        The GeneXpert MRSA Assay (FDA approved for NASAL specimens only), is one component of a comprehensive MRSA colonization surveillance program. It is not intended to diagnose MRSA infection nor to guide or monitor treatment for MRSA infections.      Labs: BNP (last 3 results)  Recent Labs  03/22/17 1156  BNP 663.0*   Basic Metabolic Panel:  Recent Labs Lab 03/22/17 1156 03/23/17 0412 03/24/17 0430 03/25/17 0735 03/26/17 0800  NA 137 140 136 135 136  K 3.5 3.8 3.4* 3.9 3.8  CL 106 113* 110 107 106  CO2 22 20* 21* 22  26  GLUCOSE 105* 104* 99 96 88  BUN 20 30* 20 20 19   CREATININE 0.77 0.82 0.62 0.58 0.66  CALCIUM 8.8* 8.1* 7.9* 8.0* 8.1*   Liver Function Tests:  Recent Labs Lab 03/22/17 1156 03/23/17 0412 03/26/17 0800  AST 88* 92* 48*  ALT 23 25 27   ALKPHOS 80 58 82  BILITOT 2.9* 1.8* 1.7*  PROT 6.6 5.3* 5.6*  ALBUMIN 2.8* 2.2* 2.3*   No results for input(s): LIPASE, AMYLASE in the last 168 hours.  Recent Labs Lab 03/26/17 1334 03/27/17 0456  AMMONIA 56* 29   CBC:  Recent Labs Lab 03/22/17 1156 03/23/17 0412 03/24/17 0430 03/25/17 0735  WBC 14.3* 6.9 7.2 7.7  NEUTROABS 13.2*  --   --   --   HGB 12.7 11.5* 12.6 12.2  HCT 36.4 33.5* 35.1* 35.0*  MCV 96.3 97.7 96.2 95.9  PLT 84* 80* 71* 85*   Cardiac Enzymes: No results for input(s): CKTOTAL, CKMB, CKMBINDEX, TROPONINI in the last 168 hours. BNP: Invalid input(s): POCBNP CBG: No results for input(s): GLUCAP in the last 168 hours. D-Dimer No results for input(s): DDIMER in the last 72 hours. Hgb A1c No results for input(s): HGBA1C in the last 72 hours. Lipid Profile No results for input(s): CHOL, HDL, LDLCALC, TRIG, CHOLHDL, LDLDIRECT in the last 72 hours. Thyroid function studies No results for input(s): TSH, T4TOTAL, T3FREE, THYROIDAB in the last 72 hours.  Invalid input(s): FREET3 Anemia work up No results for input(s): VITAMINB12, FOLATE, FERRITIN, TIBC, IRON, RETICCTPCT in the last 72 hours. Urinalysis    Component Value Date/Time   COLORURINE AMBER (A) 03/22/2017 1143   APPEARANCEUR CLOUDY (A) 03/22/2017 1143   LABSPEC 1.019 03/22/2017 1143   PHURINE 6.0 03/22/2017 1143   GLUCOSEU NEGATIVE 03/22/2017 1143   HGBUR MODERATE (A) 03/22/2017 1143   BILIRUBINUR NEGATIVE 03/22/2017 1143   KETONESUR 5 (A) 03/22/2017 1143   PROTEINUR 30 (A) 03/22/2017 1143   UROBILINOGEN 2.0 (H) 07/19/2015 1045   NITRITE NEGATIVE 03/22/2017 1143   LEUKOCYTESUR SMALL (A) 03/22/2017 1143   Sepsis Labs Invalid input(s):  PROCALCITONIN,  WBC,  LACTICIDVEN Microbiology Recent Results (from the past 240 hour(s))  Culture, blood (routine x 2)  Status: None   Collection Time: 03/22/17 11:56 AM  Result Value Ref Range Status   Specimen Description BLOOD RIGHT ARM  Final   Special Requests   Final    BOTTLES DRAWN AEROBIC AND ANAEROBIC Blood Culture adequate volume   Culture NO GROWTH 5 DAYS  Final   Report Status 03/27/2017 FINAL  Final  Culture, blood (routine x 2)     Status: None   Collection Time: 03/22/17 12:14 PM  Result Value Ref Range Status   Specimen Description BLOOD LEFT ANTECUBITAL  Final   Special Requests   Final    BOTTLES DRAWN AEROBIC AND ANAEROBIC Blood Culture adequate volume Blood Culture results may not be optimal due to an inadequate volume of blood received in culture bottles   Culture NO GROWTH 5 DAYS  Final   Report Status 03/27/2017 FINAL  Final  Urine culture     Status: Abnormal   Collection Time: 03/22/17  1:30 PM  Result Value Ref Range Status   Specimen Description URINE, RANDOM  Final   Special Requests NONE  Final   Culture (A)  Final    >=100,000 COLONIES/mL ESCHERICHIA COLI >=100,000 COLONIES/mL AEROCOCCUS URINAE    Report Status 03/27/2017 FINAL  Final   Organism ID, Bacteria ESCHERICHIA COLI (A)  Final      Susceptibility   Escherichia coli - MIC*    AMPICILLIN >=32 RESISTANT Resistant     CEFAZOLIN <=4 SENSITIVE Sensitive     CEFTRIAXONE <=1 SENSITIVE Sensitive     CIPROFLOXACIN >=4 RESISTANT Resistant     GENTAMICIN <=1 SENSITIVE Sensitive     IMIPENEM <=0.25 SENSITIVE Sensitive     NITROFURANTOIN <=16 SENSITIVE Sensitive     TRIMETH/SULFA >=320 RESISTANT Resistant     AMPICILLIN/SULBACTAM 16 INTERMEDIATE Intermediate     PIP/TAZO <=4 SENSITIVE Sensitive     Extended ESBL NEGATIVE Sensitive     * >=100,000 COLONIES/mL ESCHERICHIA COLI  MRSA PCR Screening     Status: None   Collection Time: 03/22/17  2:59 PM  Result Value Ref Range Status   MRSA by  PCR NEGATIVE NEGATIVE Final    Comment:        The GeneXpert MRSA Assay (FDA approved for NASAL specimens only), is one component of a comprehensive MRSA colonization surveillance program. It is not intended to diagnose MRSA infection nor to guide or monitor treatment for MRSA infections.      Time coordinating discharge: Over 30 minutes  SIGNED:   Alba Cory, MD  [facility] 03/27/2017, 11:30 AM Pager [phone]  If 7PM-7AM, please contact night-coverage [email or web address] Password [email or web address]

## 2017-03-29 ENCOUNTER — Non-Acute Institutional Stay (SKILLED_NURSING_FACILITY): Payer: Medicare Other | Admitting: Internal Medicine

## 2017-03-29 ENCOUNTER — Encounter: Payer: Self-pay | Admitting: Internal Medicine

## 2017-03-29 DIAGNOSIS — J181 Lobar pneumonia, unspecified organism: Secondary | ICD-10-CM | POA: Diagnosis not present

## 2017-03-29 DIAGNOSIS — I5032 Chronic diastolic (congestive) heart failure: Secondary | ICD-10-CM

## 2017-03-29 DIAGNOSIS — I10 Essential (primary) hypertension: Secondary | ICD-10-CM | POA: Diagnosis not present

## 2017-03-29 DIAGNOSIS — N390 Urinary tract infection, site not specified: Secondary | ICD-10-CM

## 2017-03-29 DIAGNOSIS — J189 Pneumonia, unspecified organism: Secondary | ICD-10-CM

## 2017-03-29 DIAGNOSIS — K224 Dyskinesia of esophagus: Secondary | ICD-10-CM | POA: Diagnosis not present

## 2017-03-29 NOTE — Progress Notes (Signed)
Provider:  Einar Crow Location:   [facility] Room Number: 134/P Place of Service:  SNF (31)  PCP: Dwana Melena, MD Patient Care Team: Benita Stabile, MD as PCP - General (Internal Medicine)  Extended Emergency Contact Information Primary Emergency Contact: Dub Amis [place] Home Phone: [phone] Relation: None Secondary Emergency Contact: [facility] Address: [address]          [place], [place] [address] [place] Home Phone: [phone] Relation: Daughter  Code Status: Full Code Goals of Care: Advanced Directive information Advanced Directives 03/29/2017  Does Patient Have a Medical Advance Directive? Yes  Type of Advance Directive (No Data)  Does patient want to make changes to medical advance directive? No - Patient declined  Copy of Healthcare Power of Attorney in Chart? -  Would patient like information on creating a medical advance directive? -      Chief Complaint  Patient presents with  . New Admit To SNF    HPI: Patient is a 83 y.o. female seen today for admission to SNF for therapy She has h/o HTN, Hyperlipidemia, Neuropathy, GERD and AAA.  Patient lives by herself at home and is very independent. Her son and daughter help her with IADLS. She was at home by herself and was feeling weak and tired. She fell and was unable to get up. Her Son found her after few hours. She was febrile in the hospital and had Bronchopneumonia and UTI. She is on Clindamycin and Macrobid for completing treatment. She also had esophageal dysmotility with tertiary Contractions in mid and Lower Esophagus. And was started on Cardizem. Patient denies any dysphagia or Nausea or vomiting. She also had mildly elevated ammonia of 56 which was treated with Lactulose. Patient is doing well in facility. She says she was here before and is already going to the bathroom in her wheelchair. She wants to go home. Says her Son checks on her  periodically. She still has cough. No Chest pain or fever.    Past Medical History:  Diagnosis Date  . GERD (gastroesophageal reflux disease)   . Hyperlipidemia   . Hypertension   . Neuromuscular disorder [facility] [place])    Past Surgical History:  Procedure Laterality Date  . ABDOMINAL AORTIC ANEURYSM REPAIR    . BREAST BIOPSY    . CHOLECYSTECTOMY    . INGUINAL HERNIA REPAIR    . TOTAL HIP ARTHROPLASTY      reports that she quit smoking about 25 years ago. Her smoking use included Cigarettes. She smoked 0.50 packs per day. She has never used smokeless tobacco. She reports that she does not drink alcohol or use drugs. Social History   Social History  . Marital status: Widowed    Spouse name: N/A  . Number of children: N/A  . Years of education: N/A   Occupational History  . Not on file.   Social History Main Topics  . Smoking status: Former Smoker    Packs/day: 0.50    Types: Cigarettes    Quit date: 11/28/1991  . Smokeless tobacco: Never Used  . Alcohol use No  . Drug use: No  . Sexual activity: Not on file   Other Topics Concern  . Not on file   Social History Narrative  . No narrative on file    Functional Status Survey:    Family History  Problem Relation Age of Onset  . Heart disease Father     Health Maintenance  Topic Date Due  .  PNA vac Low Risk Adult (1 of 2 - PCV13) 10/27/2017 (Originally 12/27/1993)  . INFLUENZA VACCINE  06/27/2017  . TETANUS/TDAP  06/16/2026  . DEXA SCAN  Completed    Allergies  Allergen Reactions  . Sulfa Antibiotics Anaphylaxis  . Cephalexin Itching and Rash    Outpatient Encounter Prescriptions as of 03/29/2017  Medication Sig  . albuterol (PROVENTIL) (2.5 MG/3ML) 0.083% nebulizer solution Take 3 mLs (2.5 mg total) by nebulization every 6 (six) hours.  . ALPRAZolam (XANAX) 0.25 MG tablet Take 1 tablet (0.25 mg total) by mouth at bedtime as needed for anxiety or sleep.  Marland Kitchen aspirin EC 325 MG EC tablet Take 1 tablet (325 mg total)  by mouth daily with breakfast.  . clindamycin (CLEOCIN) 300 MG capsule Take 1 capsule (300 mg total) by mouth every 8 (eight) hours.  Marland Kitchen diltiazem (CARDIZEM CD) 120 MG 24 hr capsule Take 1 capsule (120 mg total) by mouth daily.  . feeding supplement, ENSURE ENLIVE, (ENSURE ENLIVE) LIQD Take 237 mLs by mouth 2 (two) times daily between meals.  . furosemide (LASIX) 20 MG tablet Take 20 mg by mouth daily.  Marland Kitchen gabapentin (NEURONTIN) 100 MG capsule Take 100 mg by mouth 2 (two) times daily. For Neuropathy  . gabapentin (NEURONTIN) 300 MG capsule Take 300 mg by mouth at bedtime. For neuropathy  . lactulose (CHRONULAC) 10 GM/15ML solution Take 45 mLs (30 g total) by mouth daily.  Marland Kitchen lisinopril (PRINIVIL,ZESTRIL) 10 MG tablet Take 10 mg by mouth daily.  . nitrofurantoin, macrocrystal-monohydrate, (MACROBID) 100 MG capsule Take 1 capsule (100 mg total) by mouth every 12 (twelve) hours.  . pantoprazole (PROTONIX) 40 MG tablet Take 1 tablet (40 mg total) by mouth 2 (two) times daily.  . potassium chloride (K-DUR) 10 MEQ tablet Take 10 mEq by mouth daily.  . traMADol (ULTRAM) 50 MG tablet Take 1 tablet (50 mg total) by mouth every 12 (twelve) hours as needed for moderate pain.  . [DISCONTINUED] gabapentin (NEURONTIN) 100 MG capsule Take 100-300 mg by mouth 3 (three) times daily.    No facility-administered encounter medications on file as of 03/29/2017.      Review of Systems  Review of Systems  Constitutional: Negative for activity change, appetite change, chills, diaphoresis, fatigue and fever.  HENT: Negative for mouth sores, postnasal drip, rhinorrhea, sinus pain and sore throat.   Respiratory: Negative for apnea, cough, chest tightness, shortness of breath and wheezing.   Cardiovascular: Negative for chest pain, palpitations and leg swelling.  Gastrointestinal: Negative for abdominal distention, abdominal pain, constipation, diarrhea, nausea and vomiting.  Genitourinary: Negative for dysuria and  frequency.  Musculoskeletal: Negative for arthralgias, joint swelling and myalgias.  Skin: Negative for rash.  Neurological: Negative for dizziness, syncope, weakness, light-headedness and numbness.  Psychiatric/Behavioral: Negative for behavioral problems, confusion and sleep disturbance.     Vitals:   03/29/17 1122  BP: (!) 120/52  Pulse: 95  Resp: 20  Temp: 98.4 F (36.9 C)  TempSrc: Oral   There is no height or weight on file to calculate BMI. Physical Exam  Constitutional: She is oriented to person, place, and time. She appears well-developed and well-nourished.  HENT:  Head: Normocephalic.  Mouth/Throat: Oropharynx is clear and moist.  Eyes: Pupils are equal, round, and reactive to light.  Neck: Neck supple.  Cardiovascular: Normal rate and regular rhythm.   Murmur heard. Pulmonary/Chest: Effort normal. No respiratory distress. She has no wheezes. She has no rales.  Abdominal: Soft. Bowel sounds are normal. She  exhibits no distension. There is no tenderness. There is no rebound.  Musculoskeletal:  Moderate edema B/L  Neurological: She is alert and oriented to person, place, and time.  Had good strength in all extremities with No focal deficit.  Skin: Skin is warm and dry.  Psychiatric: She has a normal mood and affect. Her behavior is normal. Thought content normal.    Labs reviewed: Basic Metabolic Panel:  Recent Labs  79/48/01 0430 03/25/17 0735 03/26/17 0800  NA 136 135 136  K 3.4* 3.9 3.8  CL 110 107 106  CO2 21* 22 26  GLUCOSE 99 96 88  BUN 20 20 19   CREATININE 0.62 0.58 0.66  CALCIUM 7.9* 8.0* 8.1*   Liver Function Tests:  Recent Labs  03/22/17 1156 03/23/17 0412 03/26/17 0800  AST 88* 92* 48*  ALT 23 25 27   ALKPHOS 80 58 82  BILITOT 2.9* 1.8* 1.7*  PROT 6.6 5.3* 5.6*  ALBUMIN 2.8* 2.2* 2.3*   No results for input(s): LIPASE, AMYLASE in the last 8760 hours.  Recent Labs  03/26/17 1334 03/27/17 0456  AMMONIA 56* 29    CBC:  Recent Labs  03/22/17 1156 03/23/17 0412 03/24/17 0430 03/25/17 0735  WBC 14.3* 6.9 7.2 7.7  NEUTROABS 13.2*  --   --   --   HGB 12.7 11.5* 12.6 12.2  HCT 36.4 33.5* 35.1* 35.0*  MCV 96.3 97.7 96.2 95.9  PLT 84* 80* 71* 85*   Cardiac Enzymes: No results for input(s): CKTOTAL, CKMB, CKMBINDEX, TROPONINI in the last 8760 hours. BNP: Invalid input(s): POCBNP Lab Results  Component Value Date   HGBA1C 5.2 07/27/2015   No results found for: TSH No results found for: VITAMINB12 No results found for: FOLATE No results found for: IRON, TIBC, FERRITIN  Imaging and Procedures obtained prior to SNF admission: Dg Esophagus  Result Date: 03/27/2017 CLINICAL DATA:  Dysphagia, difficulty swallowing. EXAM: ESOPHOGRAM/BARIUM SWALLOW TECHNIQUE: Single contrast examination was performed using  thin barium. FLUOROSCOPY TIME:  Fluoroscopy Time:  54 seconds Radiation Exposure Index (if provided by the fluoroscopic device): Number of Acquired Spot Images: 0 COMPARISON:  None. FINDINGS: Fluoroscopic evaluation of swallowing demonstrates disruption of primary esophageal peristaltic waves. Tertiary contractions in the mid and lower esophagus. No fixed stricture, fold thickening or mass. Large hiatal hernia noted. Spontaneous gastroesophageal reflux noted during the procedure. IMPRESSION: Esophageal dysmotility with tertiary contractions. No fixed stricture. Large hiatal hernia. Spontaneous reflux. Electronically Signed   By: Charlett Nose M.D.   On: 03/27/2017 11:05    Assessment/Plan  Community acquired pneumonia   Patient is doing well. She is afebrile. White count is back to normal. Check POX QD for few days as she still has cough and Mild SOB Repeat Chest Xray as per Radiology  UTI On Nitrofurantoin   Essential hypertension On lisinopril. BP stable.  Chronic diastolic congestive heart failure  On Lasix. Doing well. Check Daily Weights.  Esophageal dysmotility She is not  Symptomatic She is on Protonix and was started on Cardizem.  Increased transaminase and Mild Ammonia level. Patient is doing well and is alert and oriented. Will repeat a hepatic Panel. Was started on Lactulose. Will stop it right now.  Hyperlipidemia Continue Pravastatin.   Will ask her family bring her meds as there are few new meds and I don't have her Home List.     Family/ staff Communication:   Labs/tests ordered:

## 2017-04-02 ENCOUNTER — Encounter (HOSPITAL_COMMUNITY)
Admission: RE | Admit: 2017-04-02 | Discharge: 2017-04-02 | Disposition: A | Discharge status: Home / Self Care | Payer: Medicare Other | Source: Skilled Nursing Facility | Attending: Internal Medicine | Admitting: Internal Medicine

## 2017-04-02 DIAGNOSIS — E785 Hyperlipidemia, unspecified: Secondary | ICD-10-CM | POA: Insufficient documentation

## 2017-04-02 DIAGNOSIS — G629 Polyneuropathy, unspecified: Secondary | ICD-10-CM | POA: Insufficient documentation

## 2017-04-02 DIAGNOSIS — I5032 Chronic diastolic (congestive) heart failure: Secondary | ICD-10-CM | POA: Insufficient documentation

## 2017-04-02 DIAGNOSIS — I10 Essential (primary) hypertension: Secondary | ICD-10-CM | POA: Insufficient documentation

## 2017-04-02 DIAGNOSIS — B9620 Unspecified Escherichia coli [E. coli] as the cause of diseases classified elsewhere: Secondary | ICD-10-CM | POA: Insufficient documentation

## 2017-04-02 LAB — CBC WITH DIFFERENTIAL/PLATELET
BASOS ABS: 0.1 10*3/uL (ref 0.0–0.1)
Basophils Relative: 1 %
EOS PCT: 2 %
Eosinophils Absolute: 0.1 10*3/uL (ref 0.0–0.7)
HCT: 35.4 % — ABNORMAL LOW (ref 36.0–46.0)
HEMOGLOBIN: 11.6 g/dL — AB (ref 12.0–15.0)
LYMPHS ABS: 1.9 10*3/uL (ref 0.7–4.0)
LYMPHS PCT: 31 %
MCH: 32.9 pg (ref 26.0–34.0)
MCHC: 32.8 g/dL (ref 30.0–36.0)
MCV: 100.3 fL — AB (ref 78.0–100.0)
Monocytes Absolute: 0.5 10*3/uL (ref 0.1–1.0)
Monocytes Relative: 8 %
NEUTROS PCT: 58 %
Neutro Abs: 3.6 10*3/uL (ref 1.7–7.7)
Platelets: 145 10*3/uL — ABNORMAL LOW (ref 150–400)
RBC: 3.53 MIL/uL — AB (ref 3.87–5.11)
RDW: 14.4 % (ref 11.5–15.5)
WBC: 6.2 10*3/uL (ref 4.0–10.5)

## 2017-04-02 LAB — HEPATIC FUNCTION PANEL
ALK PHOS: 98 U/L (ref 38–126)
ALT: 24 U/L (ref 14–54)
AST: 38 U/L (ref 15–41)
Albumin: 2.3 g/dL — ABNORMAL LOW (ref 3.5–5.0)
Bilirubin, Direct: 0.3 mg/dL (ref 0.1–0.5)
Indirect Bilirubin: 0.9 mg/dL (ref 0.3–0.9)
TOTAL PROTEIN: 6.2 g/dL — AB (ref 6.5–8.1)
Total Bilirubin: 1.2 mg/dL (ref 0.3–1.2)

## 2017-04-02 LAB — BASIC METABOLIC PANEL
ANION GAP: 7 (ref 5–15)
BUN: 12 mg/dL (ref 6–20)
CHLORIDE: 105 mmol/L (ref 101–111)
CO2: 27 mmol/L (ref 22–32)
Calcium: 8.5 mg/dL — ABNORMAL LOW (ref 8.9–10.3)
Creatinine, Ser: 0.65 mg/dL (ref 0.44–1.00)
GFR calc Af Amer: >60 mL/min (ref >60)
GLUCOSE: 96 mg/dL (ref 65–99)
POTASSIUM: 4 mmol/L (ref 3.5–5.1)
SODIUM: 139 mmol/L (ref 135–145)

## 2017-04-12 ENCOUNTER — Encounter: Payer: Self-pay | Admitting: Internal Medicine

## 2017-04-12 ENCOUNTER — Non-Acute Institutional Stay (SKILLED_NURSING_FACILITY): Payer: Medicare Other | Admitting: Internal Medicine

## 2017-04-12 DIAGNOSIS — I5032 Chronic diastolic (congestive) heart failure: Secondary | ICD-10-CM

## 2017-04-12 DIAGNOSIS — J181 Lobar pneumonia, unspecified organism: Secondary | ICD-10-CM | POA: Diagnosis not present

## 2017-04-12 DIAGNOSIS — N390 Urinary tract infection, site not specified: Secondary | ICD-10-CM

## 2017-04-12 DIAGNOSIS — K224 Dyskinesia of esophagus: Secondary | ICD-10-CM

## 2017-04-12 DIAGNOSIS — J189 Pneumonia, unspecified organism: Secondary | ICD-10-CM

## 2017-04-12 NOTE — Progress Notes (Signed)
Location:   [facility] Room Number: 134/P Place of Service:  SNF (31)  Provider: Einar Crow  PCP: Benita Stabile, MD Patient Care Team: Benita Stabile, MD as PCP - General (Internal Medicine)  Extended Emergency Contact Information Primary Emergency Contact: Dub [place] Home Phone: [phone] Relation: None Secondary Emergency Contact: [facility] Address: [address]          [place], [place] [address] [place] Home Phone: [phone] Relation: Daughter  Code Status: Full Code Goals of care:  Advanced Directive information Advanced Directives 04/12/2017  Does Patient Have a Medical Advance Directive? Yes  Type of Advance Directive (No Data)  Does patient want to make changes to medical advance directive? No - Patient declined  Copy of Healthcare Power of Attorney in Chart? -  Would patient like information on creating a medical advance directive? -     Allergies  Allergen Reactions  . Sulfa Antibiotics Anaphylaxis  . Cephalexin Itching and Rash    Chief Complaint  Patient presents with  . Discharge Note    HPI:  83 y.o. female  Is seen today for discharge.  She has h/o HTN, Hyperlipidemia, Neuropathy, GERD and AAA. She was admitted after she was found by her son fallen at home. In the hospital she was found to have Bronchopneumonia and UTI. She was started on Clindamycin and Macrobid. She was discharged to the SNF for Therapy. Patient was also started on Cardizem for esophageal contractions. She did well in facility. She is walking without support and is able to transfer. She is going home with a sitter and her sons will check on her constantly. She denied any complains to me. Her cough is better . No SOB or Chest pain. No other complains. Just want to go home.    Past Medical History:  Diagnosis Date  . GERD (gastroesophageal reflux disease)   . Hyperlipidemia   . Hypertension   .  Neuromuscular disorder [facility])     Past Surgical History:  Procedure Laterality Date  . ABDOMINAL AORTIC ANEURYSM REPAIR    . BREAST BIOPSY    . CHOLECYSTECTOMY    . INGUINAL HERNIA REPAIR    . TOTAL HIP ARTHROPLASTY        reports that she quit smoking about 25 years ago. Her smoking use included Cigarettes. She smoked 0.50 packs per day. She has never used smokeless tobacco. She reports that she does not drink alcohol or use drugs. Social History   Social History  . Marital status: Widowed    Spouse name: N/A  . Number of children: N/A  . Years of education: N/A   Occupational History  . Not on file.   Social History Main Topics  . Smoking status: Former Smoker    Packs/day: 0.50    Types: Cigarettes    Quit date: 11/28/1991  . Smokeless tobacco: Never Used  . Alcohol use No  . Drug use: No  . Sexual activity: Not on file   Other Topics Concern  . Not on file   Social History Narrative  . No narrative on file   Functional Status Survey:    Allergies  Allergen Reactions  . Sulfa Antibiotics Anaphylaxis  . Cephalexin Itching and Rash    Pertinent  Health Maintenance Due  Topic Date Due  . PNA vac Low Risk Adult (1 of 2 - PCV13) 10/27/2017 (Originally 12/27/1993)  . INFLUENZA VACCINE  06/27/2017  . DEXA SCAN  Completed    Medications: Outpatient Encounter Prescriptions as of 04/12/2017  Medication Sig  . albuterol (PROVENTIL) (2.5 MG/3ML) 0.083% nebulizer solution Take 3 mLs (2.5 mg total) by nebulization every 6 (six) hours.  Marland Kitchen aspirin EC 325 MG EC tablet Take 1 tablet (325 mg total) by mouth daily with breakfast.  . diltiazem (CARDIZEM CD) 120 MG 24 hr capsule Take 1 capsule (120 mg total) by mouth daily.  . feeding supplement, ENSURE ENLIVE, (ENSURE ENLIVE) LIQD Take 237 mLs by mouth 2 (two) times daily between meals.  . furosemide (LASIX) 20 MG tablet Take 20 mg by mouth daily.  Marland Kitchen gabapentin (NEURONTIN) 100 MG capsule Take 100 mg by mouth 2 (two) times  daily. For Neuropathy  . gabapentin (NEURONTIN) 300 MG capsule Take 300 mg by mouth at bedtime. For neuropathy  . lactulose (CHRONULAC) 10 GM/15ML solution Take 45 mLs (30 g total) by mouth daily.  Marland Kitchen lisinopril (PRINIVIL,ZESTRIL) 10 MG tablet Take 10 mg by mouth daily.  . pantoprazole (PROTONIX) 40 MG tablet Take 1 tablet (40 mg total) by mouth 2 (two) times daily.  . potassium chloride (K-DUR) 10 MEQ tablet Take 10 mEq by mouth daily.  . traMADol (ULTRAM) 50 MG tablet Take 50 mg by mouth every 6 (six) hours as needed.  . [DISCONTINUED] ALPRAZolam (XANAX) 0.25 MG tablet Take 1 tablet (0.25 mg total) by mouth at bedtime as needed for anxiety or sleep.  . [DISCONTINUED] clindamycin (CLEOCIN) 300 MG capsule Take 1 capsule (300 mg total) by mouth every 8 (eight) hours.  . [DISCONTINUED] nitrofurantoin, macrocrystal-monohydrate, (MACROBID) 100 MG capsule Take 1 capsule (100 mg total) by mouth every 12 (twelve) hours.  . [DISCONTINUED] traMADol (ULTRAM) 50 MG tablet Take 1 tablet (50 mg total) by mouth every 12 (twelve) hours as needed for moderate pain. (Patient taking differently: Take 50 mg by mouth every 6 (six) hours as needed for moderate pain. )   No facility-administered encounter medications on file as of 04/12/2017.      Review of Systems  Vitals:   04/12/17 1454  BP: 134/66  Pulse: 78  Resp: 20  Temp: 97.6 F (36.4 C)  TempSrc: Oral   There is no height or weight on file to calculate BMI. Physical Exam  Constitutional: She is oriented to person, place, and time. She appears well-developed and well-nourished.  HENT:  Head: Normocephalic.  Mouth/Throat: Oropharynx is clear and moist.  Eyes: Pupils are equal, round, and reactive to light.  Neck: Neck supple.  Cardiovascular: Normal rate, regular rhythm and normal heart sounds.   Pulmonary/Chest: Effort normal and breath sounds normal. No respiratory distress. She has no wheezes. She has no rales.  Abdominal: Soft. Bowel sounds  are normal. She exhibits no distension. There is no tenderness. There is no rebound.  Musculoskeletal:  Moderate edema B/L Right more then left   Neurological: She is alert and oriented to person, place, and time.  No Focal deficits.  Skin: Skin is warm and dry.  Psychiatric: She has a normal mood and affect. Her behavior is normal. Judgment and thought content normal.    Labs reviewed: Basic Metabolic Panel:  Recent Labs  16/10/96 0735 03/26/17 0800 04/02/17 0700  NA 135 136 139  K 3.9 3.8 4.0  CL 107 106 105  CO2 22 26 27   GLUCOSE 96 88 96  BUN 20 19 12   CREATININE 0.58 0.66 0.65  CALCIUM 8.0* 8.1* 8.5*   Liver Function Tests:  Recent Labs  03/23/17 0412 03/26/17  0800 04/02/17 0700  AST 92* 48* 38  ALT 25 27 24   ALKPHOS 58 82 98  BILITOT 1.8* 1.7* 1.2  PROT 5.3* 5.6* 6.2*  ALBUMIN 2.2* 2.3* 2.3*   No results for input(s): LIPASE, AMYLASE in the last 8760 hours.  Recent Labs  03/26/17 1334 03/27/17 0456  AMMONIA 56* 29   CBC:  Recent Labs  03/22/17 1156  03/24/17 0430 03/25/17 0735 04/02/17 0700  WBC 14.3*  < > 7.2 7.7 6.2  NEUTROABS 13.2*  --   --   --  3.6  HGB 12.7  < > 12.6 12.2 11.6*  HCT 36.4  < > 35.1* 35.0* 35.4*  MCV 96.3  < > 96.2 95.9 100.3*  PLT 84*  < > 71* 85* 145*  < > = values in this interval not displayed. Cardiac Enzymes: No results for input(s): CKTOTAL, CKMB, CKMBINDEX, TROPONINI in the last 8760 hours. BNP: Invalid input(s): POCBNP CBG: No results for input(s): GLUCAP in the last 8760 hours.  Procedures and Imaging Studies During Stay: Dg Chest 2 View  Result Date: 03/22/2017 CLINICAL DATA:  83 year old female with thoracic spine pain after falling earlier today. Additionally, patient has fever and chills. EXAM: CHEST  2 VIEW COMPARISON:  Prior chest x-ray 07/15/2015 FINDINGS: Stable cardiomegaly. Atherosclerotic calcifications present in the transverse aorta. Patchy airspace opacity in the lingula obscures the cardiac  margin. Diffuse mild interstitial prominence with peripheral Kerley B-lines suggests chronic CHF. Overall, the degree of pulmonary vascular congestion is less than seen on prior chest x-rays. Extremely limited visualization of the thoracic spine secondary to diffuse demineralization of the osseous structures. No pneumothorax or pleural effusion. Mild lingular atelectasis. IMPRESSION: 1. Patchy airspace opacity in the lingula concerning for bronchopneumonia given the clinical history of fever and chills. Followup PA and lateral chest X-ray is recommended in 3-4 weeks following trial of antibiotic therapy to ensure resolution and exclude underlying malignancy. 2. Stable cardiomegaly and aortic atherosclerosis. Aortic Atherosclerosis (ICD10-170.0) 3. Diffuse mild interstitial prominence likely representing mild chronic CHF. The degree of vascular congestion is less than seen on 07/15/2015. 4. Limited evaluation of the osseous structures secondary to advanced demineralization. No definite fracture. Electronically Signed   By: Malachy Moan M.D.   On: 03/22/2017 13:27   Dg Esophagus  Result Date: 03/27/2017 CLINICAL DATA:  Dysphagia, difficulty swallowing. EXAM: ESOPHOGRAM/BARIUM SWALLOW TECHNIQUE: Single contrast examination was performed using  thin barium. FLUOROSCOPY TIME:  Fluoroscopy Time:  54 seconds Radiation Exposure Index (if provided by the fluoroscopic device): Number of Acquired Spot Images: 0 COMPARISON:  None. FINDINGS: Fluoroscopic evaluation of swallowing demonstrates disruption of primary esophageal peristaltic waves. Tertiary contractions in the mid and lower esophagus. No fixed stricture, fold thickening or mass. Large hiatal hernia noted. Spontaneous gastroesophageal reflux noted during the procedure. IMPRESSION: Esophageal dysmotility with tertiary contractions. No fixed stricture. Large hiatal hernia. Spontaneous reflux. Electronically Signed   By: Charlett Nose M.D.   On: 03/27/2017 11:05     Assessment/Plan:   Community acquired pneumonia   Patient is doing well. She is afebrile. White count is back to normal. SOB resolved and she is not on Oxygen anymore Repeat Chest Xray as per Radiology in 3 weeks. Will arrange as outpatient.  UTI Resolved   Essential hypertension On lisinopril. BP stable. Even on Cardizem  Chronic diastolic congestive heart failure  On Lasix. Doing well. Her weight was stable in Facility. Needs BMP as out patient  Esophageal dysmotility She is not Symptomatic She is on Protonix  and was started on Cardizem. Patient has had no Dysphagia in facility. Will need follow up from GI if symptom return.  Increased transaminase and Mild Ammonia level.   But repeat Labs in facility are normal. Will do follow up Hepatic panel.  .Hyperlipidemia Continue Pravastatin.   Future labs/tests needed:   Patient need to follow up with Her PCP in 2 weeks. Follow up labs BMP , Hepatic panel and CBC And Chest Xray as outpatient.

## 2017-04-13 ENCOUNTER — Encounter (HOSPITAL_COMMUNITY)
Admission: RE | Admit: 2017-04-13 | Discharge: 2017-04-13 | Disposition: A | Discharge status: Home / Self Care | Payer: Medicare Other | Source: Skilled Nursing Facility | Attending: Internal Medicine | Admitting: Internal Medicine

## 2017-04-13 DIAGNOSIS — E785 Hyperlipidemia, unspecified: Secondary | ICD-10-CM | POA: Diagnosis not present

## 2017-04-13 DIAGNOSIS — I5032 Chronic diastolic (congestive) heart failure: Secondary | ICD-10-CM | POA: Diagnosis not present

## 2017-04-13 DIAGNOSIS — G629 Polyneuropathy, unspecified: Secondary | ICD-10-CM | POA: Insufficient documentation

## 2017-04-13 DIAGNOSIS — B9620 Unspecified Escherichia coli [E. coli] as the cause of diseases classified elsewhere: Secondary | ICD-10-CM | POA: Diagnosis not present

## 2017-04-13 DIAGNOSIS — I10 Essential (primary) hypertension: Secondary | ICD-10-CM | POA: Diagnosis not present

## 2017-04-13 LAB — CBC WITH DIFFERENTIAL/PLATELET
BASOS ABS: 0 10*3/uL (ref 0.0–0.1)
Basophils Relative: 1 %
Eosinophils Absolute: 0.2 10*3/uL (ref 0.0–0.7)
Eosinophils Relative: 5 %
HEMATOCRIT: 35.6 % — AB (ref 36.0–46.0)
HEMOGLOBIN: 11.8 g/dL — AB (ref 12.0–15.0)
LYMPHS PCT: 39 %
Lymphs Abs: 2.1 10*3/uL (ref 0.7–4.0)
MCH: 32.9 pg (ref 26.0–34.0)
MCHC: 33.1 g/dL (ref 30.0–36.0)
MCV: 99.2 fL (ref 78.0–100.0)
MONO ABS: 0.5 10*3/uL (ref 0.1–1.0)
Monocytes Relative: 9 %
NEUTROS ABS: 2.5 10*3/uL (ref 1.7–7.7)
NEUTROS PCT: 46 %
Platelets: 169 10*3/uL (ref 150–400)
RBC: 3.59 MIL/uL — ABNORMAL LOW (ref 3.87–5.11)
RDW: 14.6 % (ref 11.5–15.5)
WBC: 5.3 10*3/uL (ref 4.0–10.5)

## 2017-04-13 LAB — BASIC METABOLIC PANEL
ANION GAP: 7 (ref 5–15)
BUN: 14 mg/dL (ref 6–20)
CHLORIDE: 107 mmol/L (ref 101–111)
CO2: 24 mmol/L (ref 22–32)
Calcium: 9 mg/dL (ref 8.9–10.3)
Creatinine, Ser: 0.92 mg/dL (ref 0.44–1.00)
GFR calc non Af Amer: 54 mL/min — ABNORMAL LOW (ref >60)
GLUCOSE: 103 mg/dL — AB (ref 65–99)
POTASSIUM: 4.2 mmol/L (ref 3.5–5.1)
Sodium: 138 mmol/L (ref 135–145)

## 2017-04-13 LAB — HEPATIC FUNCTION PANEL
ALBUMIN: 2.7 g/dL — AB (ref 3.5–5.0)
ALK PHOS: 107 U/L (ref 38–126)
ALT: 19 U/L (ref 14–54)
AST: 34 U/L (ref 15–41)
BILIRUBIN DIRECT: 0.3 mg/dL (ref 0.1–0.5)
BILIRUBIN TOTAL: 1.3 mg/dL — AB (ref 0.3–1.2)
Indirect Bilirubin: 1 mg/dL — ABNORMAL HIGH (ref 0.3–0.9)
Total Protein: 6.8 g/dL (ref 6.5–8.1)

## 2017-04-16 DIAGNOSIS — I110 Hypertensive heart disease with heart failure: Secondary | ICD-10-CM | POA: Diagnosis not present

## 2017-04-16 DIAGNOSIS — G709 Myoneural disorder, unspecified: Secondary | ICD-10-CM | POA: Diagnosis not present

## 2017-04-16 DIAGNOSIS — Z8701 Personal history of pneumonia (recurrent): Secondary | ICD-10-CM | POA: Diagnosis not present

## 2017-04-16 DIAGNOSIS — H9193 Unspecified hearing loss, bilateral: Secondary | ICD-10-CM | POA: Diagnosis not present

## 2017-04-16 DIAGNOSIS — Z87440 Personal history of urinary (tract) infections: Secondary | ICD-10-CM | POA: Diagnosis not present

## 2017-04-16 DIAGNOSIS — K219 Gastro-esophageal reflux disease without esophagitis: Secondary | ICD-10-CM | POA: Diagnosis not present

## 2017-04-16 DIAGNOSIS — E785 Hyperlipidemia, unspecified: Secondary | ICD-10-CM | POA: Diagnosis not present

## 2017-04-16 DIAGNOSIS — G609 Hereditary and idiopathic neuropathy, unspecified: Secondary | ICD-10-CM | POA: Diagnosis not present

## 2017-04-16 DIAGNOSIS — Z9181 History of falling: Secondary | ICD-10-CM | POA: Diagnosis not present

## 2017-04-16 DIAGNOSIS — I5033 Acute on chronic diastolic (congestive) heart failure: Secondary | ICD-10-CM | POA: Diagnosis not present

## 2017-04-22 DIAGNOSIS — M6281 Muscle weakness (generalized): Secondary | ICD-10-CM | POA: Diagnosis not present

## 2017-04-22 DIAGNOSIS — I5030 Unspecified diastolic (congestive) heart failure: Secondary | ICD-10-CM | POA: Diagnosis not present

## 2017-04-22 DIAGNOSIS — J189 Pneumonia, unspecified organism: Secondary | ICD-10-CM | POA: Diagnosis not present

## 2017-04-25 DIAGNOSIS — N390 Urinary tract infection, site not specified: Secondary | ICD-10-CM | POA: Diagnosis not present

## 2017-04-25 DIAGNOSIS — Z79899 Other long term (current) drug therapy: Secondary | ICD-10-CM | POA: Diagnosis not present

## 2017-04-25 DIAGNOSIS — J189 Pneumonia, unspecified organism: Secondary | ICD-10-CM | POA: Diagnosis not present

## 2017-04-25 DIAGNOSIS — K219 Gastro-esophageal reflux disease without esophagitis: Secondary | ICD-10-CM | POA: Diagnosis not present

## 2017-04-25 DIAGNOSIS — Z6826 Body mass index (BMI) 26.0-26.9, adult: Secondary | ICD-10-CM | POA: Diagnosis not present

## 2017-04-25 DIAGNOSIS — E063 Autoimmune thyroiditis: Secondary | ICD-10-CM | POA: Diagnosis not present

## 2017-05-01 DIAGNOSIS — K219 Gastro-esophageal reflux disease without esophagitis: Secondary | ICD-10-CM | POA: Diagnosis not present

## 2017-05-01 DIAGNOSIS — R1310 Dysphagia, unspecified: Secondary | ICD-10-CM | POA: Diagnosis not present

## 2017-05-01 DIAGNOSIS — K449 Diaphragmatic hernia without obstruction or gangrene: Secondary | ICD-10-CM | POA: Diagnosis not present

## 2017-08-14 DIAGNOSIS — I10 Essential (primary) hypertension: Secondary | ICD-10-CM | POA: Diagnosis not present

## 2017-08-14 DIAGNOSIS — Z23 Encounter for immunization: Secondary | ICD-10-CM | POA: Diagnosis not present

## 2017-08-14 DIAGNOSIS — K625 Hemorrhage of anus and rectum: Secondary | ICD-10-CM | POA: Diagnosis not present

## 2017-08-14 DIAGNOSIS — K922 Gastrointestinal hemorrhage, unspecified: Secondary | ICD-10-CM | POA: Diagnosis not present

## 2017-08-14 DIAGNOSIS — Z1389 Encounter for screening for other disorder: Secondary | ICD-10-CM | POA: Diagnosis not present

## 2017-08-14 DIAGNOSIS — Z6826 Body mass index (BMI) 26.0-26.9, adult: Secondary | ICD-10-CM | POA: Diagnosis not present

## 2017-08-14 DIAGNOSIS — K623 Rectal prolapse: Secondary | ICD-10-CM | POA: Diagnosis not present

## 2017-08-28 ENCOUNTER — Encounter: Payer: Self-pay | Admitting: Internal Medicine

## 2017-09-27 ENCOUNTER — Ambulatory Visit (INDEPENDENT_AMBULATORY_CARE_PROVIDER_SITE_OTHER): Payer: Medicare Other | Admitting: Nurse Practitioner

## 2017-09-27 ENCOUNTER — Encounter: Payer: Self-pay | Admitting: Nurse Practitioner

## 2017-09-27 DIAGNOSIS — K649 Unspecified hemorrhoids: Secondary | ICD-10-CM | POA: Insufficient documentation

## 2017-09-27 DIAGNOSIS — K625 Hemorrhage of anus and rectum: Secondary | ICD-10-CM | POA: Insufficient documentation

## 2017-09-27 MED ORDER — HYDROCORTISONE 2.5 % RE CREA: 1.0000 "application " | TOPICAL_CREAM | Freq: Two times a day (BID) | RECTAL | 1 refills | Status: DC

## 2017-09-27 NOTE — Assessment & Plan Note (Signed)
Noted rectal bleeding and heme positive stool. She has a history of hemorrhoids. Hemorrhoid cream over-the-counter has helped some. Denies any other GI symptoms. Discussed the risks and benefits of endoscopic evaluation. Given her age and her comorbidities a discussion with her family and the patient, it is felt that endoscopic evaluation would carry more risks than benefits. She is unlikely to proceed treatment even if we were to find colon cancer. At this point I will send an Anusol rectal cream, return for follow-up in 3 months to check CBC and monitor her clinical progress. Call if any questions.

## 2017-09-27 NOTE — Assessment & Plan Note (Signed)
Noted hemorrhoids with rectal bleeding. No other GI symptoms. Declines endoscopic evaluation at this time given her age and risk versus benefit discussion. At this point her rectal bleeding is likely due to benign anorectal source. I will send an Anusol rectal cream to her pharmacy. Return for follow-up in 3 months to update CBC and monitor clinical progress.

## 2017-09-27 NOTE — Progress Notes (Signed)
Primary Care Physician:  Fusco, Lawrence, MD Primary Gastroenterologist:  Dr. JenaElfredia Nevins Gaussourk  Chief Complaint  Patient presents with  . Rectal Bleeding    x 3 years mostly with BM.    HPI:   Particia NearingFrances I Young is a 83 y.o. female who presents on referral from primary care for recurrent lower GI bleed. She has not been seen by our office since 2008. Last colonoscopy found in our records dated 05/19/2005 which found left-sided diverticular disease, internal hemorrhoids, otherwise normal. Recommended fiber supplement, Anusol suppositories. No recommendation for repeat surveillance exam; the patient was age 83 at that time.  PCP note reviewed at which point patient scribed 3 months of rectal bleeding. Recommended referral to GI. Labs were checked, hemoglobin 13.1.  No recent abdominal imaging on file.  Today she is accompanied by her son and daughter-in-law and they state she's had rectal bleeding for a few months since she had a digital rectal exam. Has hemorrhoid symptoms and known hemorrhoids. OTC hemorrhoid cream helps. Deneis abdominal pain, N/V, unintentional weight loss. Denies chest pain, dyspnea, dizziness, lightheadedness, syncope, near syncope. Denies any other upper or lower GI symptoms.  Past Medical History:  Diagnosis Date  . GERD (gastroesophageal reflux disease)   . Hyperlipidemia   . Hypertension   . Neuromuscular disorder [facility](HCC)     Past Surgical History:  Procedure Laterality Date  . ABDOMINAL AORTIC ANEURYSM REPAIR    . BREAST BIOPSY    . CHOLECYSTECTOMY    . INGUINAL HERNIA REPAIR    . TOTAL HIP ARTHROPLASTY      Current Outpatient Prescriptions  Medication Sig Dispense Refill  . aspirin EC 325 MG EC tablet Take 1 tablet (325 mg total) by mouth daily with breakfast. 30 tablet 0  . diltiazem (CARDIZEM CD) 120 MG 24 hr capsule Take 1 capsule (120 mg total) by mouth daily. 30 capsule 0  . furosemide (LASIX) 20 MG tablet Take 20 mg by mouth daily.    Marland Kitchen. gabapentin  (NEURONTIN) 100 MG capsule Take 100 mg by mouth 2 (two) times daily. For Neuropathy    . gabapentin (NEURONTIN) 300 MG capsule Take 300 mg by mouth at bedtime. For neuropathy    . lisinopril (PRINIVIL,ZESTRIL) 10 MG tablet Take 10 mg by mouth daily.    . pantoprazole (PROTONIX) 40 MG tablet Take 1 tablet (40 mg total) by mouth 2 (two) times daily. (Patient taking differently: Take 40 mg by mouth daily. ) 60 tablet 0  . potassium chloride (K-DUR) 10 MEQ tablet Take 10 mEq by mouth daily.    . hydrocortisone (ANUSOL-HC) 2.5 % rectal cream Place 1 application rectally 2 (two) times daily. 30 g 1   No current facility-administered medications for this visit.     Allergies as of 09/27/2017 - Review Complete 09/27/2017  Allergen Reaction Noted  . Sulfa antibiotics Anaphylaxis 04/27/2011  . Cephalexin Itching and Rash 04/27/2011    Family History  Problem Relation Age of Onset  . Heart disease Father     Social History   Social History  . Marital status: Widowed    Spouse name: N/A  . Number of children: N/A  . Years of education: N/A   Occupational History  . Not on file.   Social History Main Topics  . Smoking status: Former Smoker    Packs/day: 0.50    Types: Cigarettes    Quit date: 11/28/1991  . Smokeless tobacco: Never Used  . Alcohol use No  . Drug use: No  .  Sexual activity: Not on file   Other Topics Concern  . Not on file   Social History Narrative  . No narrative on file    Review of Systems: General: Negative for anorexia, weight loss, fever, chills, fatigue, weakness. ENT: Negative for hoarseness, difficulty swallowing. CV: Negative for chest pain, angina, palpitations, peripheral edema.  Respiratory: Negative for dyspnea at rest, cough, sputum, wheezing.  GI: See history of present illness. MS: Negative for joint pain, low back pain.  Derm: Negative for rash or itching.  Endo: Negative for unusual weight change.  Heme: Negative for bruising or  bleeding. Allergy: Negative for rash or hives.    Physical Exam: BP (!) 165/75   Pulse 83   Temp (!) 97 F (36.1 C) (Oral)   Ht 5\' 6"  (1.676 m)   Wt 172 lb 12.8 oz (78.4 kg)   BMI 27.89 kg/m  General:   Alert and oriented. Pleasant and cooperative. Well-nourished and well-developed.  Head:  Normocephalic and atraumatic. Eyes:  Without icterus, sclera clear and conjunctiva pink.  Ears:  Very hard of hearing. Cardiovascular:  S1, S2 present without murmurs appreciated. Extremities without clubbing or edema. Respiratory:  Clear to auscultation bilaterally. No wheezes, rales, or rhonchi. No distress.  Gastrointestinal:  +BS, soft, non-tender and non-distended. No HSM noted. No guarding or rebound. No masses appreciated.  Rectal:  Deferred  Musculoskalatal:  Symmetrical without gross deformities. Neurologic:  Alert and oriented x4;  grossly normal neurologically. Psych:  Alert and cooperative. Normal mood and affect. Heme/Lymph/Immune: No excessive bruising noted.    09/27/2017 10:45 AM   Disclaimer: This note was dictated with voice recognition software. Similar sounding words can inadvertently be transcribed and may not be corrected upon review.

## 2017-09-27 NOTE — Progress Notes (Signed)
cc'ed to pcp °

## 2017-09-27 NOTE — Patient Instructions (Signed)
1. I have sent an Anusol rectal cream to your pharmacy. You can use this twice a day, up to 10 days at a time. 2. Return for follow-up in 3 months. 3. Call us if you have any questions or concerns. 4. Call us if you have any worsening symptoms.

## 2017-10-01 DIAGNOSIS — M79675 Pain in left toe(s): Secondary | ICD-10-CM | POA: Diagnosis not present

## 2017-10-01 DIAGNOSIS — M79674 Pain in right toe(s): Secondary | ICD-10-CM | POA: Diagnosis not present

## 2017-10-01 DIAGNOSIS — B351 Tinea unguium: Secondary | ICD-10-CM | POA: Diagnosis not present

## 2017-12-10 DIAGNOSIS — B351 Tinea unguium: Secondary | ICD-10-CM | POA: Diagnosis not present

## 2017-12-10 DIAGNOSIS — M79674 Pain in right toe(s): Secondary | ICD-10-CM | POA: Diagnosis not present

## 2017-12-10 DIAGNOSIS — M79675 Pain in left toe(s): Secondary | ICD-10-CM | POA: Diagnosis not present

## 2017-12-31 ENCOUNTER — Ambulatory Visit: Payer: Medicare Other | Admitting: Nurse Practitioner

## 2018-01-10 DIAGNOSIS — L219 Seborrheic dermatitis, unspecified: Secondary | ICD-10-CM | POA: Diagnosis not present

## 2018-01-10 DIAGNOSIS — Z6826 Body mass index (BMI) 26.0-26.9, adult: Secondary | ICD-10-CM | POA: Diagnosis not present

## 2018-01-10 DIAGNOSIS — E063 Autoimmune thyroiditis: Secondary | ICD-10-CM | POA: Diagnosis not present

## 2018-01-10 DIAGNOSIS — K219 Gastro-esophageal reflux disease without esophagitis: Secondary | ICD-10-CM | POA: Diagnosis not present

## 2018-02-11 DIAGNOSIS — Z6825 Body mass index (BMI) 25.0-25.9, adult: Secondary | ICD-10-CM | POA: Diagnosis not present

## 2018-02-11 DIAGNOSIS — K219 Gastro-esophageal reflux disease without esophagitis: Secondary | ICD-10-CM | POA: Diagnosis not present

## 2018-02-11 DIAGNOSIS — R197 Diarrhea, unspecified: Secondary | ICD-10-CM | POA: Diagnosis not present

## 2018-02-11 DIAGNOSIS — Z1389 Encounter for screening for other disorder: Secondary | ICD-10-CM | POA: Diagnosis not present

## 2018-02-11 DIAGNOSIS — E063 Autoimmune thyroiditis: Secondary | ICD-10-CM | POA: Diagnosis not present

## 2018-02-12 DIAGNOSIS — R300 Dysuria: Secondary | ICD-10-CM | POA: Diagnosis not present

## 2018-02-25 DIAGNOSIS — M79674 Pain in right toe(s): Secondary | ICD-10-CM | POA: Diagnosis not present

## 2018-02-25 DIAGNOSIS — M79675 Pain in left toe(s): Secondary | ICD-10-CM | POA: Diagnosis not present

## 2018-02-25 DIAGNOSIS — B351 Tinea unguium: Secondary | ICD-10-CM | POA: Diagnosis not present

## 2018-03-14 ENCOUNTER — Emergency Department (HOSPITAL_COMMUNITY)
Admission: EM | Admit: 2018-03-14 | Discharge: 2018-03-14 | Disposition: A | Discharge status: Home / Self Care | Payer: Medicare Other | Attending: Emergency Medicine | Admitting: Emergency Medicine

## 2018-03-14 ENCOUNTER — Encounter (HOSPITAL_COMMUNITY): Payer: Self-pay | Admitting: *Deleted

## 2018-03-14 ENCOUNTER — Other Ambulatory Visit: Payer: Self-pay

## 2018-03-14 DIAGNOSIS — I110 Hypertensive heart disease with heart failure: Secondary | ICD-10-CM | POA: Diagnosis not present

## 2018-03-14 DIAGNOSIS — Z96649 Presence of unspecified artificial hip joint: Secondary | ICD-10-CM | POA: Diagnosis not present

## 2018-03-14 DIAGNOSIS — Y9389 Activity, other specified: Secondary | ICD-10-CM | POA: Diagnosis not present

## 2018-03-14 DIAGNOSIS — Z87891 Personal history of nicotine dependence: Secondary | ICD-10-CM | POA: Diagnosis not present

## 2018-03-14 DIAGNOSIS — S51802A Unspecified open wound of left forearm, initial encounter: Secondary | ICD-10-CM | POA: Insufficient documentation

## 2018-03-14 DIAGNOSIS — I509 Heart failure, unspecified: Secondary | ICD-10-CM | POA: Insufficient documentation

## 2018-03-14 DIAGNOSIS — W07XXXA Fall from chair, initial encounter: Secondary | ICD-10-CM | POA: Insufficient documentation

## 2018-03-14 DIAGNOSIS — S51012A Laceration without foreign body of left elbow, initial encounter: Secondary | ICD-10-CM | POA: Diagnosis not present

## 2018-03-14 DIAGNOSIS — Z79899 Other long term (current) drug therapy: Secondary | ICD-10-CM | POA: Diagnosis not present

## 2018-03-14 DIAGNOSIS — Y929 Unspecified place or not applicable: Secondary | ICD-10-CM | POA: Insufficient documentation

## 2018-03-14 DIAGNOSIS — Y999 Unspecified external cause status: Secondary | ICD-10-CM | POA: Insufficient documentation

## 2018-03-14 NOTE — Discharge Instructions (Addendum)
Leave the clear covering over the laceration. Keep it clean and dry. Have D Fusco recheck it in a week. Recheck sooner if it gets infected (increased redness, swelling, increased pain, fever, drains pus).

## 2018-03-14 NOTE — ED Notes (Signed)
Pt and family members stated 'tired of waiting, wanted to leave unless doctor would be in within 5 minutes'. Reassured pt MD was moving as quickly as possible. Family member stated they would only wait 5 more minutes.

## 2018-03-14 NOTE — ED Triage Notes (Signed)
Fell, left elbow injury, denies pain

## 2018-03-14 NOTE — ED Notes (Signed)
Family does not want an xray. Orders cancelled.

## 2018-03-14 NOTE — ED Provider Notes (Signed)
[facility] EMERGENCY DEPARTMENT Provider Note   CSN: [id number] Arrival date & time: 03/14/18  1802  Time seen 23:25 PM   History   Chief Complaint Chief Complaint  Patient presents with  . Fall  . Elbow Injury    HPI [name] is a 84 y.o. female.  HPI patient states about 5 PM she was sitting down in a chair in the chair tipped over and she hit her left elbow on the chair causing a skin tear.  She denies any pain in her elbow.  She thinks she may have hit her head however she has no pain now.  She denies pain anywhere.  She denies being on blood thinners.  Patient states she is right-handed.  When I reviewed the chart she last had a tetanus shot in 2017.  PCP Elfredia Nevins, MD   Past Medical History:  Diagnosis Date  . GERD (gastroesophageal reflux disease)   . Hyperlipidemia   . Hypertension   . Neuromuscular disorder [facility])     Patient Active Problem List   Diagnosis Date Noted  . Rectal bleeding 09/27/2017  . Hemorrhoids 09/27/2017  . Esophageal dysmotility 03/29/2017  . Sepsis (HCC) 03/22/2017  . Acute lower UTI 03/22/2017  . Community acquired pneumonia 03/22/2017  . Hip fracture (HCC) 07/16/2015  . Aortic heart murmur 07/16/2015  . Chronic CHF (HCC) 07/16/2015  . Essential hypertension 07/16/2015  . Neuropathy 07/16/2015  . Abdominal aortic aneurysm (HCC) 06/17/2012  . Aftercare following surgery of the circulatory system, NEC 06/17/2012    Past Surgical History:  Procedure Laterality Date  . ABDOMINAL AORTIC ANEURYSM REPAIR    . BREAST BIOPSY    . CHOLECYSTECTOMY    . INGUINAL HERNIA REPAIR    . TOTAL HIP ARTHROPLASTY       OB History   None      Home Medications    Prior to Admission medications   Medication Sig Start Date End Date Taking? Authorizing Provider  furosemide (LASIX) 20 MG tablet Take 20 mg by mouth daily.   Yes [provider]  gabapentin (NEURONTIN) 100 MG capsule Take 100 mg by mouth 2 (two) times daily.  For Neuropathy   Yes [provider]  gabapentin (NEURONTIN) 300 MG capsule Take 300 mg by mouth at bedtime. For neuropathy   Yes [provider]  hydrocortisone (ANUSOL-HC) 2.5 % rectal cream Place 1 application rectally 2 (two) times daily. Patient taking differently: Place 1 application rectally 2 (two) times daily as needed for hemorrhoids or anal itching.  09/27/17  Yes Anice Paganini, NP  lisinopril (PRINIVIL,ZESTRIL) 10 MG tablet Take 10 mg by mouth daily.   Yes [provider]  pantoprazole (PROTONIX) 40 MG tablet Take 1 tablet (40 mg total) by mouth 2 (two) times daily. Patient taking differently: Take 40 mg by mouth daily.  03/27/17  Yes Regalado, Belkys A, MD  Potassium Chloride ER 20 MEQ TBCR Take 20 mEq by mouth every other day.    Yes [provider]  pravastatin (PRAVACHOL) 40 MG tablet Take 40 mg by mouth daily.  03/10/18  Yes [provider]    Family History Family History  Problem Relation Age of Onset  . Heart disease Father     Social History Social History   Tobacco Use  . Smoking status: Former Smoker    Packs/day: 0.50    Types: Cigarettes    Last attempt to quit: 11/28/1991    Years since quitting: 26.3  .  Smokeless tobacco: Never Used  Substance Use Topics  . Alcohol use: No  . Drug use: No  lives at home Lives alone Uses a walker and cane   Allergies   Sulfa antibiotics and Cephalexin   Review of Systems Review of Systems  All other systems reviewed and are negative.    Physical Exam Updated Vital Signs BP (!) 162/78 (BP Location: Right Arm)   Pulse 74   Temp (!) 97.4 F (36.3 C) (Oral)   Resp 17   Ht 5\' 9"  (1.753 m)   Wt 78.9 kg (174 lb)   SpO2 96%   BMI 25.70 kg/m   Vital signs normal    Physical Exam  Constitutional: She is oriented to person, place, and time. She appears well-developed and well-nourished. No distress.  HENT:  Head: Normocephalic and atraumatic.  Right Ear: External ear  normal.  Left Ear: External ear normal.  Nose: Nose normal.  Eyes: Conjunctivae and EOM are normal.  Neck: Neck supple.  Cardiovascular: Normal rate.  Pulmonary/Chest: Effort normal. No respiratory distress.  Musculoskeletal: Normal range of motion. She exhibits no edema, tenderness or deformity.  Neurological: She is alert and oriented to person, place, and time. No cranial nerve deficit.  Skin: Skin is warm and dry. No rash noted. No erythema. No pallor.  Pt has a semicircular skin tear on her left elbow, no effusion, no pain on ROM. Not bleeding.   Psychiatric: She has a normal mood and affect. Her behavior is normal. Thought content normal.  Nursing note and vitals reviewed.      ED Treatments / Results  Labs (all labs ordered are listed, but only abnormal results are displayed) Labs Reviewed - No data to display  EKG None  Radiology No results found.  Procedures Procedures (including critical care time)  Medications Ordered in ED Medications - No data to display   Initial Impression / Assessment and Plan / ED Course  I have reviewed the triage vital signs and the nursing notes.  Pertinent labs & imaging results that were available during my care of the patient were reviewed by me and considered in my medical decision making (see chart for details).    Tegaderm was placed on the skin tear.  Patient's tetanus status is up-to-date.  She is to follow-up with her primary care doctor next week, she should be rechecked sooner for any signs of infection.   Final Clinical Impressions(s) / ED Diagnoses   Final diagnoses:  Skin tear of left elbow without complication, initial encounter    ED Discharge Orders    None     Plan discharge  Devoria Albe, MD, Concha Pyo, MD 03/15/18 [phone]

## 2018-03-14 NOTE — ED Notes (Signed)
Pt wheeled to waiting room. Pt verbalized understanding of discharge instructions.   

## 2018-03-14 NOTE — ED Notes (Signed)
Placed 2 seteri strips and tegaderm on pts skin tear and the left elbow.

## 2018-06-20 IMAGING — DX DG CHEST 2V
2 series · 2 of 2 positions shown · non-contrast
Comparison: Prior chest x-ray 07/15/2015

CLINICAL DATA: 89-year-old female with thoracic spine pain after
falling earlier today. Additionally, patient has fever and chills.

EXAM:
CHEST  2 VIEW

[chest lat]
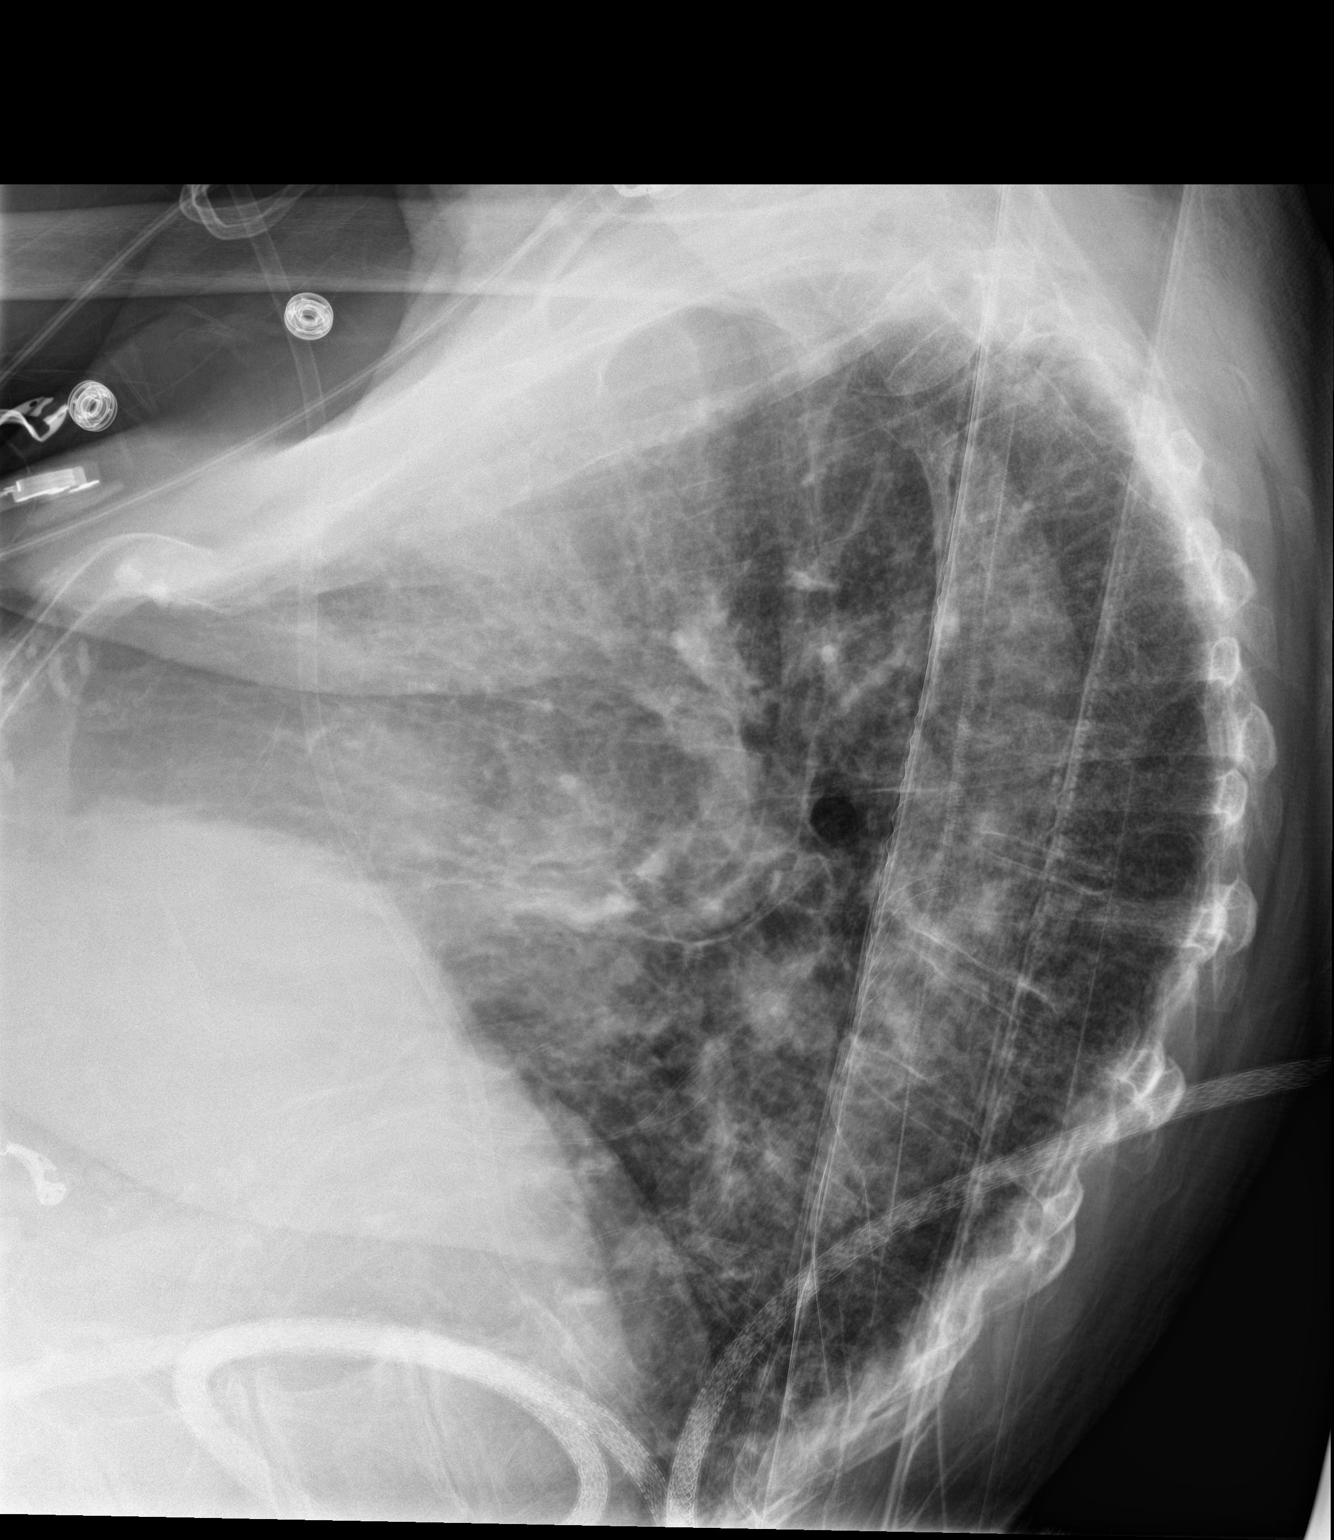

[chest ap]
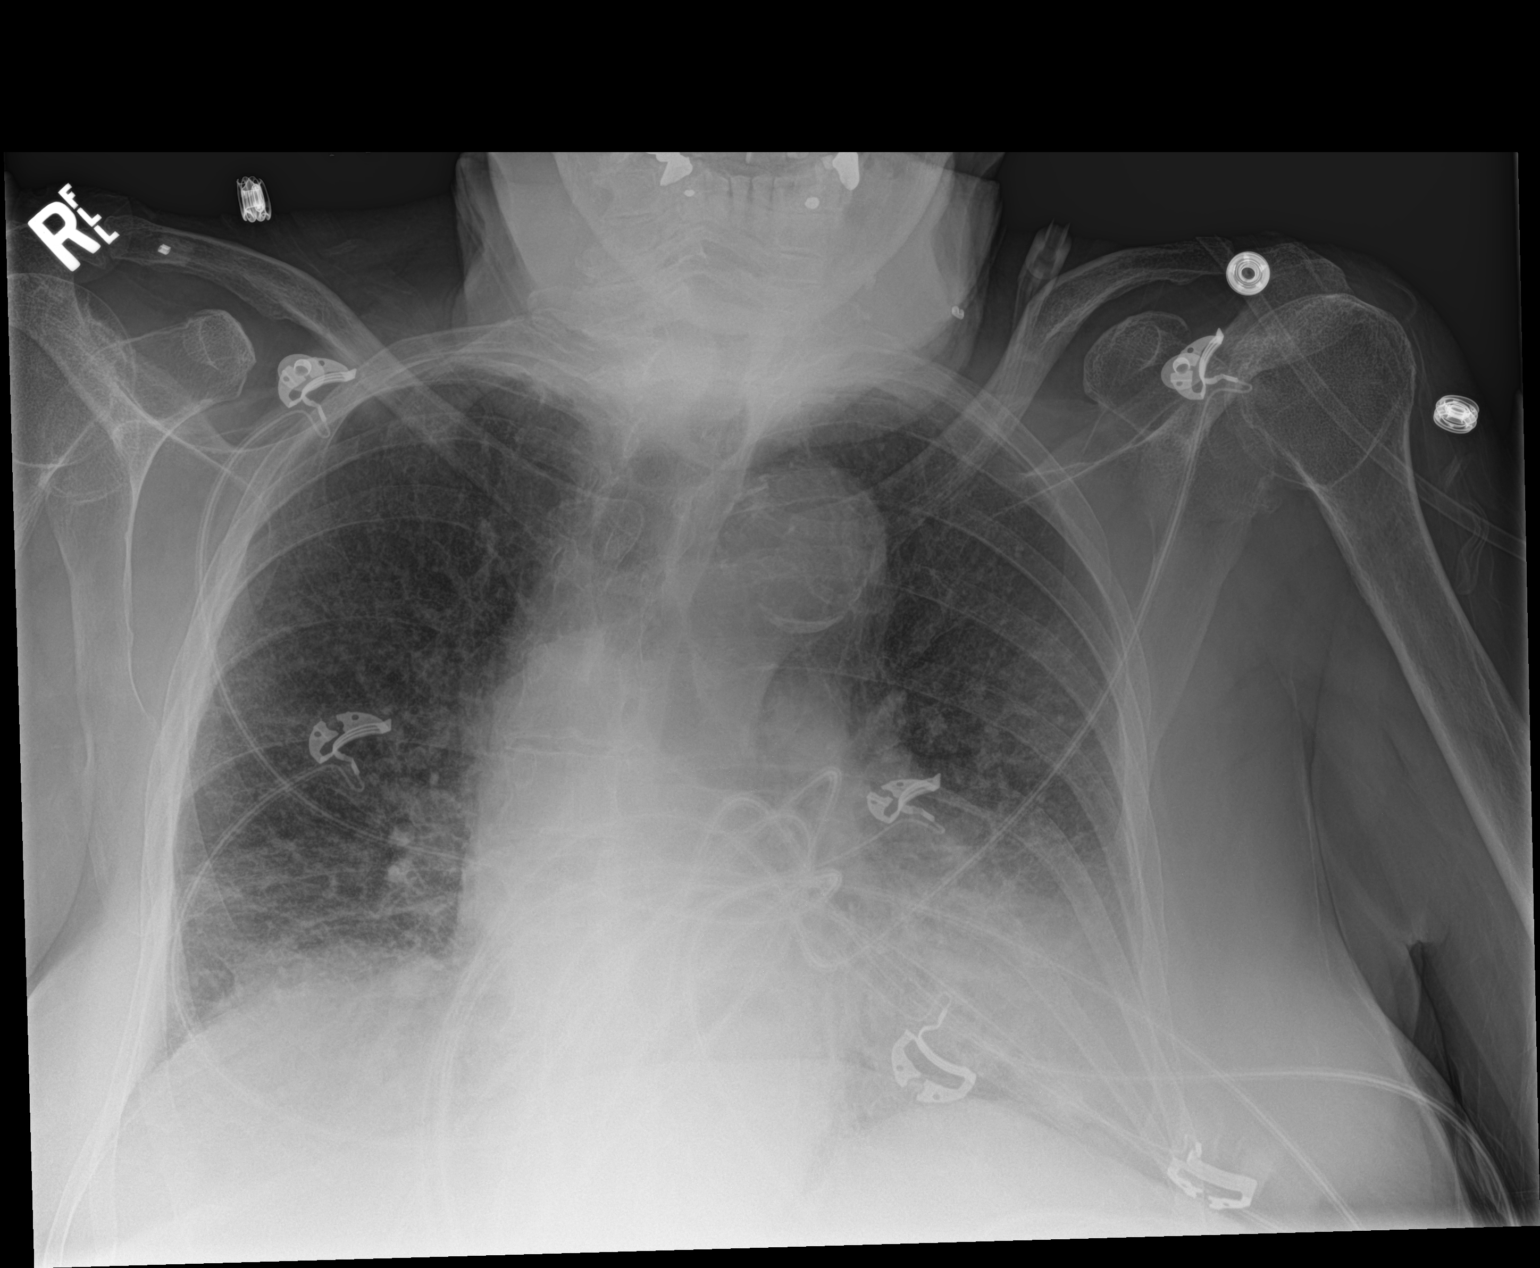

[2 of 2 positions shown; findings below may reference images not displayed]

FINDINGS: Stable cardiomegaly. Atherosclerotic calcifications present in the
transverse aorta. Patchy airspace opacity in the lingula obscures
the cardiac margin. Diffuse mild interstitial prominence with
peripheral Kerley B-lines suggests chronic CHF. Overall, the degree
of pulmonary vascular congestion is less than seen on prior chest
x-rays. Extremely limited visualization of the thoracic spine
secondary to diffuse demineralization of the osseous structures. No
pneumothorax or pleural effusion. Mild lingular atelectasis.
IMPRESSION: 1. Patchy airspace opacity in the lingula concerning for
bronchopneumonia given the clinical history of fever and chills.
Followup PA and lateral chest X-ray is recommended in 3-4 weeks
following trial of antibiotic therapy to ensure resolution and
exclude underlying malignancy.
2. Stable cardiomegaly and aortic atherosclerosis. Aortic
Atherosclerosis (MK99Y-170.0)
3. Diffuse mild interstitial prominence likely representing mild
chronic CHF. The degree of vascular congestion is less than seen on
07/15/2015.
4. Limited evaluation of the osseous structures secondary to
advanced demineralization. No definite fracture.

## 2018-06-21 DIAGNOSIS — R300 Dysuria: Secondary | ICD-10-CM | POA: Diagnosis not present

## 2018-06-21 DIAGNOSIS — N390 Urinary tract infection, site not specified: Secondary | ICD-10-CM | POA: Diagnosis not present

## 2018-06-27 DIAGNOSIS — Z0001 Encounter for general adult medical examination with abnormal findings: Secondary | ICD-10-CM | POA: Diagnosis not present

## 2018-06-27 DIAGNOSIS — Z6827 Body mass index (BMI) 27.0-27.9, adult: Secondary | ICD-10-CM | POA: Diagnosis not present

## 2018-06-27 DIAGNOSIS — Z1389 Encounter for screening for other disorder: Secondary | ICD-10-CM | POA: Diagnosis not present

## 2018-06-27 DIAGNOSIS — M064 Inflammatory polyarthropathy: Secondary | ICD-10-CM | POA: Diagnosis not present

## 2018-06-27 DIAGNOSIS — I10 Essential (primary) hypertension: Secondary | ICD-10-CM | POA: Diagnosis not present

## 2018-07-17 DIAGNOSIS — T148XXA Other injury of unspecified body region, initial encounter: Secondary | ICD-10-CM | POA: Diagnosis not present

## 2018-07-17 DIAGNOSIS — L821 Other seborrheic keratosis: Secondary | ICD-10-CM | POA: Diagnosis not present

## 2018-09-23 DIAGNOSIS — M79675 Pain in left toe(s): Secondary | ICD-10-CM | POA: Diagnosis not present

## 2018-09-23 DIAGNOSIS — B351 Tinea unguium: Secondary | ICD-10-CM | POA: Diagnosis not present

## 2018-10-22 DIAGNOSIS — G894 Chronic pain syndrome: Secondary | ICD-10-CM | POA: Diagnosis not present

## 2018-10-22 DIAGNOSIS — Z6826 Body mass index (BMI) 26.0-26.9, adult: Secondary | ICD-10-CM | POA: Diagnosis not present

## 2018-10-22 DIAGNOSIS — I10 Essential (primary) hypertension: Secondary | ICD-10-CM | POA: Diagnosis not present

## 2018-10-22 DIAGNOSIS — E063 Autoimmune thyroiditis: Secondary | ICD-10-CM | POA: Diagnosis not present

## 2018-12-11 DIAGNOSIS — B351 Tinea unguium: Secondary | ICD-10-CM | POA: Diagnosis not present

## 2018-12-11 DIAGNOSIS — L851 Acquired keratosis [keratoderma] palmaris et plantaris: Secondary | ICD-10-CM | POA: Diagnosis not present

## 2018-12-11 DIAGNOSIS — I739 Peripheral vascular disease, unspecified: Secondary | ICD-10-CM | POA: Diagnosis not present

## 2018-12-11 DIAGNOSIS — M79675 Pain in left toe(s): Secondary | ICD-10-CM | POA: Diagnosis not present

## 2019-05-01 ENCOUNTER — Other Ambulatory Visit: Payer: Self-pay

## 2019-05-01 ENCOUNTER — Encounter (HOSPITAL_COMMUNITY): Payer: Self-pay | Admitting: Emergency Medicine

## 2019-05-01 ENCOUNTER — Emergency Department (HOSPITAL_COMMUNITY): Payer: Medicare Other

## 2019-05-01 ENCOUNTER — Emergency Department (HOSPITAL_COMMUNITY)
Admission: EM | Admit: 2019-05-01 | Discharge: 2019-05-01 | Disposition: A | Discharge status: Home / Self Care | Payer: Medicare Other | Attending: Emergency Medicine | Admitting: Emergency Medicine

## 2019-05-01 DIAGNOSIS — I10 Essential (primary) hypertension: Secondary | ICD-10-CM | POA: Insufficient documentation

## 2019-05-01 DIAGNOSIS — E785 Hyperlipidemia, unspecified: Secondary | ICD-10-CM | POA: Insufficient documentation

## 2019-05-01 DIAGNOSIS — R4182 Altered mental status, unspecified: Secondary | ICD-10-CM | POA: Diagnosis not present

## 2019-05-01 DIAGNOSIS — Z87891 Personal history of nicotine dependence: Secondary | ICD-10-CM | POA: Diagnosis not present

## 2019-05-01 DIAGNOSIS — R0902 Hypoxemia: Secondary | ICD-10-CM | POA: Diagnosis not present

## 2019-05-01 DIAGNOSIS — Z1159 Encounter for screening for other viral diseases: Secondary | ICD-10-CM | POA: Diagnosis not present

## 2019-05-01 DIAGNOSIS — R531 Weakness: Secondary | ICD-10-CM | POA: Insufficient documentation

## 2019-05-01 LAB — URINALYSIS, ROUTINE W REFLEX MICROSCOPIC
Bilirubin Urine: NEGATIVE
Glucose, UA: NEGATIVE mg/dL
Hgb urine dipstick: NEGATIVE
Ketones, ur: NEGATIVE mg/dL
Leukocytes,Ua: NEGATIVE
Nitrite: NEGATIVE
Protein, ur: NEGATIVE mg/dL
Specific Gravity, Urine: 1.008 (ref 1.005–1.030)
pH: 6 (ref 5.0–8.0)

## 2019-05-01 LAB — CBC WITH DIFFERENTIAL/PLATELET
Abs Immature Granulocytes: 0.01 10*3/uL (ref 0.00–0.07)
Basophils Absolute: 0.1 10*3/uL (ref 0.0–0.1)
Basophils Relative: 1 %
Eosinophils Absolute: 0.2 10*3/uL (ref 0.0–0.5)
Eosinophils Relative: 3 %
HCT: 32.3 % — ABNORMAL LOW (ref 36.0–46.0)
Hemoglobin: 10.1 g/dL — ABNORMAL LOW (ref 12.0–15.0)
Immature Granulocytes: 0 %
Lymphocytes Relative: 48 %
Lymphs Abs: 2.3 10*3/uL (ref 0.7–4.0)
MCH: 30 pg (ref 26.0–34.0)
MCHC: 31.3 g/dL (ref 30.0–36.0)
MCV: 95.8 fL (ref 80.0–100.0)
Monocytes Absolute: 0.7 10*3/uL (ref 0.1–1.0)
Monocytes Relative: 14 %
Neutro Abs: 1.7 10*3/uL (ref 1.7–7.7)
Neutrophils Relative %: 34 %
Platelets: 95 10*3/uL — ABNORMAL LOW (ref 150–400)
RBC: 3.37 MIL/uL — ABNORMAL LOW (ref 3.87–5.11)
RDW: 17.9 % — ABNORMAL HIGH (ref 11.5–15.5)
WBC: 4.9 10*3/uL (ref 4.0–10.5)
nRBC: 0 % (ref 0.0–0.2)

## 2019-05-01 LAB — COMPREHENSIVE METABOLIC PANEL
ALT: 21 U/L (ref 0–44)
AST: 38 U/L (ref 15–41)
Albumin: 2.4 g/dL — ABNORMAL LOW (ref 3.5–5.0)
Alkaline Phosphatase: 108 U/L (ref 38–126)
Anion gap: 10 (ref 5–15)
BUN: 18 mg/dL (ref 8–23)
CO2: 22 mmol/L (ref 22–32)
Calcium: 8.2 mg/dL — ABNORMAL LOW (ref 8.9–10.3)
Chloride: 109 mmol/L (ref 98–111)
Creatinine, Ser: 1.16 mg/dL — ABNORMAL HIGH (ref 0.44–1.00)
GFR calc Af Amer: 48 mL/min — ABNORMAL LOW (ref >60)
GFR calc non Af Amer: 41 mL/min — ABNORMAL LOW (ref >60)
Glucose, Bld: 99 mg/dL (ref 70–99)
Potassium: 3.9 mmol/L (ref 3.5–5.1)
Sodium: 141 mmol/L (ref 135–145)
Total Bilirubin: 1.4 mg/dL — ABNORMAL HIGH (ref 0.3–1.2)
Total Protein: 6.2 g/dL — ABNORMAL LOW (ref 6.5–8.1)

## 2019-05-01 LAB — LIPASE, BLOOD: Lipase: 46 U/L (ref 11–51)

## 2019-05-01 LAB — SARS CORONAVIRUS 2 BY RT PCR (HOSPITAL ORDER, PERFORMED IN ~~LOC~~ HOSPITAL LAB): SARS Coronavirus 2: NEGATIVE

## 2019-05-01 LAB — LACTIC ACID, PLASMA
Lactic Acid, Venous: 1.7 mmol/L (ref 0.5–1.9)
Lactic Acid, Venous: 1.5 mmol/L (ref 0.5–1.9)

## 2019-05-01 MED ORDER — SODIUM CHLORIDE 0.9 % IV BOLUS
500.0000 mL | Freq: Once | INTRAVENOUS | Status: AC
  Administered 2019-05-01: 500 mL via INTRAVENOUS

## 2019-05-01 NOTE — ED Triage Notes (Signed)
Patient reports feeling weak this morning starting around 12pm. She states she was able to walk around this morning but started feeling weak. Patient is alert and oriented x 4 in triage. Patient is very hard of hearing.

## 2019-05-01 NOTE — ED Provider Notes (Signed)
Emergency Department Provider Note   I have reviewed the triage vital signs and the nursing notes.   HISTORY  Chief Complaint Weakness   HPI [name] is a 85 y.o. female with PMH of HLD, HTN, and GERD presents to the emergency department for evaluation of generalized weakness.  Patient states that symptoms began around lunchtime today.  She describes feeling weak all over and denies unilateral symptoms.  She is not having any fever, chills, chest pain, heart palpitations, shortness of breath.  No lower back pain.  No vomiting or diarrhea.  She denies any changes to her home medications.  She has been compliant with these medications.  No radiation of symptoms or other modifying factors.  Patient states that she can walk around but has trouble getting up to start walking. No falls.    Past Medical History:  Diagnosis Date  . GERD (gastroesophageal reflux disease)   . Hyperlipidemia   . Hypertension   . Neuromuscular disorder [facility])     Patient Active Problem List   Diagnosis Date Noted  . Rectal bleeding 09/27/2017  . Hemorrhoids 09/27/2017  . Esophageal dysmotility 03/29/2017  . Sepsis (HCC) 03/22/2017  . Acute lower UTI 03/22/2017  . Community acquired pneumonia 03/22/2017  . Hip fracture (HCC) 07/16/2015  . Aortic heart murmur 07/16/2015  . Chronic CHF (HCC) 07/16/2015  . Essential hypertension 07/16/2015  . Neuropathy 07/16/2015  . Abdominal aortic aneurysm (HCC) 06/17/2012  . Aftercare following surgery of the circulatory system, NEC 06/17/2012    Past Surgical History:  Procedure Laterality Date  . ABDOMINAL AORTIC ANEURYSM REPAIR    . BREAST BIOPSY    . CHOLECYSTECTOMY    . INGUINAL HERNIA REPAIR    . TOTAL HIP ARTHROPLASTY      Allergies Sulfa antibiotics and Cephalexin  Family History  Problem Relation Age of Onset  . Heart disease Father     Social History Social History   Tobacco Use  . Smoking status: Former Smoker    Packs/day: 0.50     Types: Cigarettes    Last attempt to quit: 11/28/1991    Years since quitting: 27.4  . Smokeless tobacco: Never Used  Substance Use Topics  . Alcohol use: No  . Drug use: No    Review of Systems  Constitutional: No fever/chills. Positive generalized weakness.  Eyes: No visual changes. ENT: No sore throat. Cardiovascular: Denies chest pain. Respiratory: Denies shortness of breath. Gastrointestinal: No abdominal pain.  No nausea, no vomiting.  No diarrhea.  No constipation. Genitourinary: Negative for dysuria. Musculoskeletal: Negative for back pain. Skin: Negative for rash. Neurological: Negative for headaches, focal weakness or numbness.  10-point ROS otherwise negative.  ____________________________________________   PHYSICAL EXAM:  VITAL SIGNS: ED Triage Vitals  Enc Vitals Group     BP --      Pulse --      Resp --      Temp 05/01/19 1821 98.5 F (36.9 C)     Temp Source 05/01/19 1821 Oral     SpO2 --      Weight 05/01/19 1822 173 lb 15.1 oz (78.9 kg)     Height 05/01/19 1822  (1.753 m)     Head Circumference --      Peak Flow --      Pain Score 05/01/19 1822 0     Pain Loc --      Pain Edu? --      Excl. in GC? --  Constitutional: Alert and oriented. Well appearing and in no acute distress. Hard of hearing.  Eyes: Conjunctivae are normal Head: Atraumatic. Nose: No congestion/rhinnorhea. Mouth/Throat: Mucous membranes are moist.   Neck: No stridor.  Cardiovascular: Normal rate, regular rhythm. Good peripheral circulation. Grossly normal heart sounds.   Respiratory: Normal respiratory effort.  No retractions. Lungs CTAB. Gastrointestinal: Soft and nontender. No distention.  Musculoskeletal: No lower extremity tenderness with 1+ pitting edema bilaterally. No gross deformities of extremities. Neurologic:  Normal speech and language. No gross focal neurologic deficits are appreciated.  Skin:  Skin is warm, dry and intact. No rash noted.    ____________________________________________   LABS (all labs ordered are listed, but only abnormal results are displayed)  Labs Reviewed  COMPREHENSIVE METABOLIC PANEL - Abnormal; Notable for the following components:      Result Value   Creatinine, Ser 1.16 (*)    Calcium 8.2 (*)    Total Protein 6.2 (*)    Albumin 2.4 (*)    Total Bilirubin 1.4 (*)    GFR calc non Af Amer 41 (*)    GFR calc Af Amer 48 (*)    All other components within normal limits  CBC WITH DIFFERENTIAL/PLATELET - Abnormal; Notable for the following components:   RBC 3.37 (*)    Hemoglobin 10.1 (*)    HCT 32.3 (*)    RDW 17.9 (*)    Platelets 95 (*)    All other components within normal limits  SARS CORONAVIRUS 2 (HOSPITAL ORDER, PERFORMED IN [facility])  URINE CULTURE  LIPASE, BLOOD  LACTIC ACID, PLASMA  LACTIC ACID, PLASMA  URINALYSIS, ROUTINE W REFLEX MICROSCOPIC   ____________________________________________  EKG   EKG Interpretation  Date/Time:  Thursday May 01 2019 18:27:08 EDT Ventricular Rate:  86 PR Interval:    QRS Duration: 89 QT Interval:  401 QTC Calculation: 480 R Axis:   3 Text Interpretation:  Sinus rhythm Multiform ventricular premature complexes No STEMI  Confirmed by Alona Bene [phone]) on 05/01/2019 9:54:34 PM       ____________________________________________  RADIOLOGY  Ct Head Wo Contrast  Result Date: 05/01/2019 CLINICAL DATA:  Altered mental status and weakness. EXAM: CT HEAD WITHOUT CONTRAST TECHNIQUE: Contiguous axial images were obtained from the base of the skull through the vertex without intravenous contrast. COMPARISON:  07/16/2015. FINDINGS: Brain: Mild-to-moderate enlargement of the ventricles and subarachnoid spaces. Minimal patchy white matter low density in both cerebral hemispheres. No intracranial hemorrhage, mass lesion or CT evidence of acute infarction. Vascular: No hyperdense vessel or unexpected calcification. Skull: Mild bilateral  hyperostosis frontalis. Negative for fracture or focal lesion. Sinuses/Orbits: Status post bilateral cataract extraction. Unremarkable paranasal sinuses. Other: None. IMPRESSION: 1. No acute abnormality. 2. Mild to moderate diffuse cerebral and cerebellar atrophy. 3. Minimal chronic small vessel white matter ischemic changes in both cerebral hemispheres. Electronically Signed   By: Beckie Salts M.D.   On: 05/01/2019 21:15   Dg Chest Portable 1 View  Result Date: 05/01/2019 CLINICAL DATA:  Weakness. EXAM: PORTABLE CHEST 1 VIEW COMPARISON:  Chest x-ray dated March 22, 2017. FINDINGS: Stable cardiomegaly. Chronic pulmonary vascular congestion and mild interstitial edema, similar to prior study. Probable small left pleural effusion. No consolidation or pneumothorax. No acute osseous abnormality. IMPRESSION: 1. Chronic mild congestive heart failure, similar to prior study. Electronically Signed   By: Obie Dredge M.D.   On: 05/01/2019 19:17    ____________________________________________   PROCEDURES  Procedure(s) performed:   Procedures  None  ____________________________________________  INITIAL IMPRESSION / ASSESSMENT AND PLAN / ED COURSE  Pertinent labs & imaging results that were available during my care of the patient were reviewed by me and considered in my medical decision making (see chart for details).   Patient presents to the emergency department for evaluation of generalized weakness.  She has no focal findings on neuro exam.  Vital signs are largely unremarkable.  Seems slightly dehydrated.  1+ pedal edema bilaterally.  Plan for screening lab work and imaging.  Broad differential at this time.  Doubt sepsis. Low suspicion for ACS.   09:45 PM  Labs reviewed.  Patient shows possible mild dehydration with elevated creatinine.  Patient feeling much better after IV fluids.  Urinalysis shows no evidence of infection.  CT imaging of the head with chronic changes but nothing acute.   Chest x-ray near patient's baseline.  She remains awake and alert here.  I got the patient up to ambulate.  She was able to get up on her feet with minimal assistance and then ambulate without difficulty to the bathroom using a walker.  She was able to use the bathroom there and then raise herself off the commode to return to her room.  I discussed this with the patient's family by phone.  My suspicion for stroke or other acute issue requiring hospitalization is exceedingly low.  They feel comfortable taking the patient back home to follow with the PCP.  I did place an order for home health and discussed that they will be in contact tomorrow to set things up. Patient and family comfortable at discharge.  ____________________________________________  FINAL CLINICAL IMPRESSION(S) / ED DIAGNOSES  Final diagnoses:  Generalized weakness    MEDICATIONS GIVEN DURING THIS VISIT:  Medications  sodium chloride 0.9 % bolus 500 mL (0 mLs Intravenous Stopped 05/01/19 1924)    Note:  This document was prepared using Dragon voice recognition software and may include unintentional dictation errors.  Alona Bene, MD Emergency Medicine    Stephan Nelis, Arlyss Repress, MD 05/01/19 2156

## 2019-05-01 NOTE — ED Notes (Signed)
Reviewed with pt's son R. Tyler Deis concerning d/c instructions, son verbalized understanding and stated that they have used Home Health in the past and is familiar with it.

## 2019-05-01 NOTE — ED Notes (Signed)
Pt ambulated to bathroom with walker. Needed minimal assistance going from lying to sitting position. Standby/contact guard assist only walking to bathroom, using bathroom and walking back to room.

## 2019-05-01 NOTE — ED Notes (Signed)
Patient transported to CT 

## 2019-05-01 NOTE — Discharge Instructions (Signed)
You were seen in the emergency department today with weakness.  Your evaluation showed some mild dehydration.  I have ordered home health.  The case manager should be calling tomorrow to help set this up.  They can come to your house and work with you at home to increase strength and endurance.  Please call your PCP tomorrow to schedule a follow-up appointment to discuss your symptoms.  Return to the emergency department with any new or suddenly worsening symptoms such as chest pain, shortness of breath, confusion, falls, or fever

## 2019-05-02 NOTE — Clinical Social Work Note (Signed)
Referral made to [facility] for PT, RN per ED request.     Annice Needy, LCSW

## 2019-05-04 LAB — URINE CULTURE
Culture: >=100000 — AB
Special Requests: NORMAL

## 2019-05-05 ENCOUNTER — Telehealth: Payer: Self-pay

## 2019-05-05 NOTE — Telephone Encounter (Signed)
No abx needed for [facility] ED 05/01/2019 per Isaias Sakai Pharm D

## 2019-05-07 DIAGNOSIS — R58 Hemorrhage, not elsewhere classified: Secondary | ICD-10-CM | POA: Diagnosis not present

## 2019-05-07 DIAGNOSIS — Z6825 Body mass index (BMI) 25.0-25.9, adult: Secondary | ICD-10-CM | POA: Diagnosis not present

## 2019-05-07 DIAGNOSIS — S50311A Abrasion of right elbow, initial encounter: Secondary | ICD-10-CM | POA: Diagnosis not present

## 2019-05-07 DIAGNOSIS — Z1389 Encounter for screening for other disorder: Secondary | ICD-10-CM | POA: Diagnosis not present

## 2019-05-07 DIAGNOSIS — G894 Chronic pain syndrome: Secondary | ICD-10-CM | POA: Diagnosis not present

## 2019-05-19 DIAGNOSIS — I509 Heart failure, unspecified: Secondary | ICD-10-CM | POA: Diagnosis not present

## 2019-05-19 DIAGNOSIS — G9009 Other idiopathic peripheral autonomic neuropathy: Secondary | ICD-10-CM | POA: Diagnosis not present

## 2019-05-19 DIAGNOSIS — E785 Hyperlipidemia, unspecified: Secondary | ICD-10-CM | POA: Diagnosis not present

## 2019-05-19 DIAGNOSIS — I110 Hypertensive heart disease with heart failure: Secondary | ICD-10-CM | POA: Diagnosis not present

## 2019-05-20 ENCOUNTER — Inpatient Hospital Stay (HOSPITAL_COMMUNITY): Payer: Medicare Other

## 2019-05-20 ENCOUNTER — Other Ambulatory Visit: Payer: Self-pay

## 2019-05-20 ENCOUNTER — Inpatient Hospital Stay (HOSPITAL_COMMUNITY)
Admit: 2019-05-20 | Discharge: 2019-05-20 | Disposition: A | Discharge status: Home / Self Care | Payer: Medicare Other | Attending: Internal Medicine | Admitting: Internal Medicine

## 2019-05-20 ENCOUNTER — Emergency Department (HOSPITAL_COMMUNITY): Payer: Medicare Other

## 2019-05-20 ENCOUNTER — Inpatient Hospital Stay (HOSPITAL_COMMUNITY)
Admission: EM | Admit: 2019-05-20 | Discharge: 2019-05-26 | DRG: 642 | Disposition: A | Discharge status: Skilled Nursing Facility | Payer: Medicare Other | Attending: Internal Medicine | Admitting: Internal Medicine

## 2019-05-20 ENCOUNTER — Encounter (HOSPITAL_COMMUNITY): Payer: Self-pay

## 2019-05-20 DIAGNOSIS — K449 Diaphragmatic hernia without obstruction or gangrene: Secondary | ICD-10-CM | POA: Diagnosis not present

## 2019-05-20 DIAGNOSIS — R1312 Dysphagia, oropharyngeal phase: Secondary | ICD-10-CM | POA: Diagnosis present

## 2019-05-20 DIAGNOSIS — Z8249 Family history of ischemic heart disease and other diseases of the circulatory system: Secondary | ICD-10-CM

## 2019-05-20 DIAGNOSIS — Z87891 Personal history of nicotine dependence: Secondary | ICD-10-CM

## 2019-05-20 DIAGNOSIS — Z8679 Personal history of other diseases of the circulatory system: Secondary | ICD-10-CM | POA: Diagnosis not present

## 2019-05-20 DIAGNOSIS — Z20828 Contact with and (suspected) exposure to other viral communicable diseases: Secondary | ICD-10-CM | POA: Diagnosis not present

## 2019-05-20 DIAGNOSIS — I714 Abdominal aortic aneurysm, without rupture, unspecified: Secondary | ICD-10-CM | POA: Diagnosis present

## 2019-05-20 DIAGNOSIS — F03918 Unspecified dementia, unspecified severity, with other behavioral disturbance: Secondary | ICD-10-CM

## 2019-05-20 DIAGNOSIS — G629 Polyneuropathy, unspecified: Secondary | ICD-10-CM | POA: Diagnosis not present

## 2019-05-20 DIAGNOSIS — E7220 Disorder of urea cycle metabolism, unspecified: Principal | ICD-10-CM | POA: Diagnosis present

## 2019-05-20 DIAGNOSIS — Z96642 Presence of left artificial hip joint: Secondary | ICD-10-CM | POA: Diagnosis present

## 2019-05-20 DIAGNOSIS — K219 Gastro-esophageal reflux disease without esophagitis: Secondary | ICD-10-CM | POA: Diagnosis present

## 2019-05-20 DIAGNOSIS — R918 Other nonspecific abnormal finding of lung field: Secondary | ICD-10-CM | POA: Diagnosis not present

## 2019-05-20 DIAGNOSIS — N179 Acute kidney failure, unspecified: Secondary | ICD-10-CM | POA: Diagnosis present

## 2019-05-20 DIAGNOSIS — R296 Repeated falls: Secondary | ICD-10-CM | POA: Diagnosis present

## 2019-05-20 DIAGNOSIS — G9340 Encephalopathy, unspecified: Secondary | ICD-10-CM | POA: Diagnosis present

## 2019-05-20 DIAGNOSIS — Z882 Allergy status to sulfonamides status: Secondary | ICD-10-CM | POA: Diagnosis not present

## 2019-05-20 DIAGNOSIS — G9341 Metabolic encephalopathy: Secondary | ICD-10-CM | POA: Diagnosis not present

## 2019-05-20 DIAGNOSIS — Z881 Allergy status to other antibiotic agents status: Secondary | ICD-10-CM | POA: Diagnosis not present

## 2019-05-20 DIAGNOSIS — R9401 Abnormal electroencephalogram [EEG]: Secondary | ICD-10-CM | POA: Diagnosis present

## 2019-05-20 DIAGNOSIS — E785 Hyperlipidemia, unspecified: Secondary | ICD-10-CM | POA: Diagnosis present

## 2019-05-20 DIAGNOSIS — R5381 Other malaise: Secondary | ICD-10-CM | POA: Diagnosis not present

## 2019-05-20 DIAGNOSIS — K5730 Diverticulosis of large intestine without perforation or abscess without bleeding: Secondary | ICD-10-CM | POA: Diagnosis not present

## 2019-05-20 DIAGNOSIS — E860 Dehydration: Secondary | ICD-10-CM | POA: Diagnosis not present

## 2019-05-20 DIAGNOSIS — I129 Hypertensive chronic kidney disease with stage 1 through stage 4 chronic kidney disease, or unspecified chronic kidney disease: Secondary | ICD-10-CM | POA: Diagnosis present

## 2019-05-20 DIAGNOSIS — R4182 Altered mental status, unspecified: Secondary | ICD-10-CM

## 2019-05-20 DIAGNOSIS — R220 Localized swelling, mass and lump, head: Secondary | ICD-10-CM

## 2019-05-20 DIAGNOSIS — R404 Transient alteration of awareness: Secondary | ICD-10-CM | POA: Diagnosis not present

## 2019-05-20 DIAGNOSIS — I700 Atherosclerosis of aorta: Secondary | ICD-10-CM | POA: Diagnosis not present

## 2019-05-20 DIAGNOSIS — F0391 Unspecified dementia with behavioral disturbance: Secondary | ICD-10-CM | POA: Diagnosis not present

## 2019-05-20 DIAGNOSIS — N183 Chronic kidney disease, stage 3 (moderate): Secondary | ICD-10-CM | POA: Diagnosis present

## 2019-05-20 DIAGNOSIS — I10 Essential (primary) hypertension: Secondary | ICD-10-CM | POA: Diagnosis not present

## 2019-05-20 LAB — URINALYSIS, ROUTINE W REFLEX MICROSCOPIC
Bilirubin Urine: NEGATIVE
Glucose, UA: NEGATIVE mg/dL
Hgb urine dipstick: NEGATIVE
Ketones, ur: NEGATIVE mg/dL
Nitrite: NEGATIVE
Protein, ur: NEGATIVE mg/dL
Specific Gravity, Urine: 1.018 (ref 1.005–1.030)
pH: 5 (ref 5.0–8.0)

## 2019-05-20 LAB — CBC WITH DIFFERENTIAL/PLATELET
Abs Immature Granulocytes: 0.01 10*3/uL (ref 0.00–0.07)
Basophils Absolute: 0.1 10*3/uL (ref 0.0–0.1)
Basophils Relative: 1 %
Eosinophils Absolute: 0.2 10*3/uL (ref 0.0–0.5)
Eosinophils Relative: 4 %
HCT: 33 % — ABNORMAL LOW (ref 36.0–46.0)
Hemoglobin: 10.2 g/dL — ABNORMAL LOW (ref 12.0–15.0)
Immature Granulocytes: 0 %
Lymphocytes Relative: 32 %
Lymphs Abs: 1.7 10*3/uL (ref 0.7–4.0)
MCH: 30.1 pg (ref 26.0–34.0)
MCHC: 30.9 g/dL (ref 30.0–36.0)
MCV: 97.3 fL (ref 80.0–100.0)
Monocytes Absolute: 0.8 10*3/uL (ref 0.1–1.0)
Monocytes Relative: 15 %
Neutro Abs: 2.5 10*3/uL (ref 1.7–7.7)
Neutrophils Relative %: 48 %
Platelets: 90 10*3/uL — ABNORMAL LOW (ref 150–400)
RBC: 3.39 MIL/uL — ABNORMAL LOW (ref 3.87–5.11)
RDW: 17.9 % — ABNORMAL HIGH (ref 11.5–15.5)
WBC: 5.3 10*3/uL (ref 4.0–10.5)
nRBC: 0 % (ref 0.0–0.2)

## 2019-05-20 LAB — LACTIC ACID, PLASMA: Lactic Acid, Venous: 2 mmol/L (ref 0.5–1.9)

## 2019-05-20 LAB — COMPREHENSIVE METABOLIC PANEL
ALT: 23 U/L (ref 0–44)
AST: 39 U/L (ref 15–41)
Albumin: 2.5 g/dL — ABNORMAL LOW (ref 3.5–5.0)
Alkaline Phosphatase: 112 U/L (ref 38–126)
Anion gap: 8 (ref 5–15)
BUN: 14 mg/dL (ref 8–23)
CO2: 21 mmol/L — ABNORMAL LOW (ref 22–32)
Calcium: 8.9 mg/dL (ref 8.9–10.3)
Chloride: 111 mmol/L (ref 98–111)
Creatinine, Ser: 1.11 mg/dL — ABNORMAL HIGH (ref 0.44–1.00)
GFR calc Af Amer: 51 mL/min — ABNORMAL LOW (ref >60)
GFR calc non Af Amer: 44 mL/min — ABNORMAL LOW (ref >60)
Glucose, Bld: 114 mg/dL — ABNORMAL HIGH (ref 70–99)
Potassium: 3.9 mmol/L (ref 3.5–5.1)
Sodium: 140 mmol/L (ref 135–145)
Total Bilirubin: 1.8 mg/dL — ABNORMAL HIGH (ref 0.3–1.2)
Total Protein: 6.4 g/dL — ABNORMAL LOW (ref 6.5–8.1)

## 2019-05-20 LAB — BRAIN NATRIURETIC PEPTIDE: B Natriuretic Peptide: 323 pg/mL — ABNORMAL HIGH (ref 0.0–100.0)

## 2019-05-20 LAB — TSH: TSH: 2.304 u[IU]/mL (ref 0.350–4.500)

## 2019-05-20 LAB — SARS CORONAVIRUS 2 BY RT PCR (HOSPITAL ORDER, PERFORMED IN ~~LOC~~ HOSPITAL LAB): SARS Coronavirus 2: NEGATIVE

## 2019-05-20 LAB — LIPASE, BLOOD: Lipase: 58 U/L — ABNORMAL HIGH (ref 11–51)

## 2019-05-20 LAB — VITAMIN B12: Vitamin B-12: 673 pg/mL (ref 180–914)

## 2019-05-20 MED ORDER — VITAMIN B-1 100 MG PO TABS
100.0000 mg | ORAL_TABLET | Freq: Every day | ORAL | Status: DC
  Administered 2019-05-20 – 2019-05-26 (×6): 100 mg via ORAL
  Filled 2019-05-20 (×7): qty 1

## 2019-05-20 MED ORDER — HALOPERIDOL LACTATE 5 MG/ML IJ SOLN
2.0000 mg | Freq: Four times a day (QID) | INTRAMUSCULAR | Status: DC | PRN
  Administered 2019-05-20: 2 mg via INTRAVENOUS
  Filled 2019-05-20: qty 1

## 2019-05-20 MED ORDER — FENTANYL CITRATE (PF) 100 MCG/2ML IJ SOLN
25.0000 ug | Freq: Once | INTRAMUSCULAR | Status: AC
  Administered 2019-05-20: 25 ug via INTRAVENOUS
  Filled 2019-05-20: qty 2

## 2019-05-20 MED ORDER — VANCOMYCIN HCL IN DEXTROSE 1-5 GM/200ML-% IV SOLN: 1000.0000 mg | INTRAVENOUS | Status: DC

## 2019-05-20 MED ORDER — ONDANSETRON HCL 4 MG/2ML IJ SOLN: 4.0000 mg | Freq: Four times a day (QID) | INTRAMUSCULAR | Status: DC | PRN

## 2019-05-20 MED ORDER — METRONIDAZOLE IN NACL 5-0.79 MG/ML-% IV SOLN
500.0000 mg | Freq: Once | INTRAVENOUS | Status: AC
  Administered 2019-05-20: 500 mg via INTRAVENOUS
  Filled 2019-05-20: qty 100

## 2019-05-20 MED ORDER — ONDANSETRON HCL 4 MG PO TABS: 4.0000 mg | ORAL_TABLET | Freq: Four times a day (QID) | ORAL | Status: DC | PRN

## 2019-05-20 MED ORDER — ACETAMINOPHEN 650 MG RE SUPP: 650.0000 mg | Freq: Four times a day (QID) | RECTAL | Status: DC | PRN

## 2019-05-20 MED ORDER — SODIUM CHLORIDE 0.9 % IV SOLN
1.0000 g | Freq: Three times a day (TID) | INTRAVENOUS | Status: DC
  Filled 2019-05-20 (×3): qty 1

## 2019-05-20 MED ORDER — PANTOPRAZOLE SODIUM 40 MG PO TBEC
40.0000 mg | DELAYED_RELEASE_TABLET | Freq: Every day | ORAL | Status: DC
  Administered 2019-05-20 – 2019-05-21 (×2): 40 mg via ORAL
  Filled 2019-05-20 (×3): qty 1

## 2019-05-20 MED ORDER — IOHEXOL 300 MG/ML  SOLN
75.0000 mL | Freq: Once | INTRAMUSCULAR | Status: AC | PRN
  Administered 2019-05-20: 75 mL via INTRAVENOUS

## 2019-05-20 MED ORDER — VANCOMYCIN HCL IN DEXTROSE 1-5 GM/200ML-% IV SOLN: 1000.0000 mg | Freq: Once | INTRAVENOUS | Status: DC

## 2019-05-20 MED ORDER — VANCOMYCIN HCL IN DEXTROSE 1-5 GM/200ML-% IV SOLN
1000.0000 mg | INTRAVENOUS | Status: DC
  Administered 2019-05-20: 1000 mg via INTRAVENOUS
  Filled 2019-05-20: qty 200

## 2019-05-20 MED ORDER — SODIUM CHLORIDE 0.9 % IV SOLN
2.0000 g | Freq: Once | INTRAVENOUS | Status: AC
  Administered 2019-05-20: 2 g via INTRAVENOUS
  Filled 2019-05-20: qty 2

## 2019-05-20 MED ORDER — PRAVASTATIN SODIUM 40 MG PO TABS
40.0000 mg | ORAL_TABLET | Freq: Every day | ORAL | Status: DC
  Administered 2019-05-20 – 2019-05-21 (×2): 40 mg via ORAL
  Filled 2019-05-20 (×5): qty 1

## 2019-05-20 MED ORDER — ACETAMINOPHEN 325 MG PO TABS: 650.0000 mg | ORAL_TABLET | Freq: Four times a day (QID) | ORAL | Status: DC | PRN

## 2019-05-20 MED ORDER — SODIUM CHLORIDE 0.9 % IV SOLN
INTRAVENOUS | Status: DC
  Administered 2019-05-20 – 2019-05-21 (×2): via INTRAVENOUS

## 2019-05-20 MED ORDER — LISINOPRIL 10 MG PO TABS
10.0000 mg | ORAL_TABLET | Freq: Every day | ORAL | Status: DC
  Administered 2019-05-20 – 2019-05-21 (×2): 10 mg via ORAL
  Filled 2019-05-20 (×3): qty 1

## 2019-05-20 NOTE — Progress Notes (Addendum)
Alert and oriented x 4.  HOH hearing aids in both ears. Marland Kitchen  Spoke with son , Jori Moll and he said that patient has been incontinent of bowel and bladder the last 3 or 4 days and has fallen twice in the last week. .  She normally walks with a walker at home and has just needed help showering and has even cooked as recently as a week ago.  Her niece stays with her during the day.  Son would like her to go to rehab for PT.  Placed on low bed.. Son picked up meds

## 2019-05-20 NOTE — ED Triage Notes (Addendum)
Family reports that pt was found in the floor beside her bed Sunday morning. She has had multiple falls. Family reports pt has been  Altered since she has had episode of confusion tries to eat a paper plate or napkin  States she wants to sit down but she is already sitting. Pt is able to tell me her name , DOB and she is in [name]

## 2019-05-20 NOTE — Progress Notes (Signed)
Pharmacy Antibiotic Note  [name] is a 85 y.o. female admitted on 05/20/2019 with infection- unknown source.  Pharmacy has been consulted for Vancomycin and Aztreonam dosing.  Plan: Vancomycin 2000 mg IV x 1 dose Vancomycin 1000 mg IV every 24 hours.  Goal trough 15-20 mcg/mL.  Aztreonam 1000 mg IV every 8 hours. Monitor labs, c/s, and vanco levels as indicated.  Height: 5\' 9"  (175.3 cm) Weight: 173 lb 15.1 oz (78.9 kg) IBW/kg (Calculated) : 66.2  Temp (24hrs), Avg:98.4 F (36.9 C), Min:98.4 F (36.9 C), Max:98.4 F (36.9 C)  Recent Labs  Lab 05/20/19 1028 05/20/19 1054  WBC 5.3  --   CREATININE 1.11*  --   LATICACIDVEN  --  2.0*    Estimated Creatinine Clearance: 35.2 mL/min (A) (by C-G formula based on SCr of 1.11 mg/dL (H)).    Allergies  Allergen Reactions  . Sulfa Antibiotics Anaphylaxis  . Cephalexin Itching and Rash    Antimicrobials this admission: Vanco 6/23 >>  Aztreonam 6/23 >>  Flagyl 6/23  Dose adjustments this admission: Aztreonam/Vanco  Microbiology results: 6/23 BCx: pending   Thank you for allowing pharmacy to be a part of this patient's care.  Ramond Craver 05/20/2019 12:30 PM

## 2019-05-20 NOTE — ED Notes (Signed)
Attempted to call report to [name], was told that she will call back  EEG in progress anyway

## 2019-05-20 NOTE — H&P (Addendum)
History and Physical    Kellie NearingFrances I Axe ZOX:[id number] DOB: 1927/03/26 DOA: 05/20/2019  PCP: Elfredia NevinsFusco, Lawrence, MD   Patient coming from: Home  Chief Complaint: AMS/Falls  HPI: [name] is a 85 y.o. female with medical history significant for GERD, hypertension, neuropathy, dyslipidemia, and dementia who has been noted to have worsening confusion and disorientation at home.  She cannot remember who she is and where she is at times as well as recognize family members.  There have been no noted behavioral disturbances, but patient has had some weakness to her legs with associated frequent falls.  Her family members check up on her on a regular basis and her niece stays with her throughout the day and her son usually visits in the evenings.  They help manage her medications and state that it is highly unlikely that she would have overdosed on any of her medications.  They deny any upper respiratory symptoms, fevers, chills, or other issues.  The patient does live on her own despite the fact that her family members visit frequently and they are afraid that her dementia may be progressing where she may have some other acute issue and will likely require placement to SNF.  No urinary incontinence or tongue biting or seizure-like activity noted at home.  No note of any toxic ingestion noted.   ED Course: Vital signs are stable and patient is afebrile.  No leukocytosis noted on laboratory data and hemoglobin is 10.2 with platelet count of 90,000.  Creatinine is 1.11 which appears to be at patient's baseline.  Urine analysis negative for UTI, but lactic acid noted to be 2.0 and patient did receive aztreonam, Flagyl, and vancomycin in the emergency department.  CT of the head is negative for any acute abnormalities and CT of the abdomen and pelvis with no acute abnormalities and 4.1 cm AAA noted.  Review of Systems: Cannot be obtained due to patient condition.   Past Medical History:  Diagnosis Date  .  GERD (gastroesophageal reflux disease)   . Hyperlipidemia   . Hypertension   . Neuromuscular disorder [facility](HCC)     Past Surgical History:  Procedure Laterality Date  . ABDOMINAL AORTIC ANEURYSM REPAIR    . BREAST BIOPSY    . CHOLECYSTECTOMY    . INGUINAL HERNIA REPAIR    . TOTAL HIP ARTHROPLASTY       reports that she quit smoking about 27 years ago. Her smoking use included cigarettes. She smoked 0.50 packs per day. She has never used smokeless tobacco. She reports that she does not drink alcohol or use drugs.  Allergies  Allergen Reactions  . Sulfa Antibiotics Anaphylaxis  . Cephalexin Itching and Rash    Family History  Problem Relation Age of Onset  . Heart disease Father     Prior to Admission medications   Medication Sig Start Date End Date Taking? Authorizing Provider  furosemide (LASIX) 20 MG tablet Take 20 mg by mouth daily.   Yes [provider]  gabapentin (NEURONTIN) 100 MG capsule Take 100 mg by mouth 2 (two) times daily. For Neuropathy   Yes [provider]  gabapentin (NEURONTIN) 300 MG capsule Take 300 mg by mouth at bedtime. For neuropathy   Yes [provider]  hydrocortisone (ANUSOL-HC) 2.5 % rectal cream Place 1 application rectally 2 (two) times daily. Patient taking differently: Place 1 application rectally 2 (two) times daily as needed for hemorrhoids or anal itching.  09/27/17  Yes Anice PaganiniGill, Eric A, NP  lisinopril (PRINIVIL,ZESTRIL) 10 MG tablet Take 10 mg by mouth daily.   Yes [provider]  pantoprazole (PROTONIX) 40 MG tablet Take 1 tablet (40 mg total) by mouth 2 (two) times daily. Patient taking differently: Take 40 mg by mouth daily.  03/27/17  Yes Regalado, Belkys A, MD  Potassium Chloride ER 20 MEQ TBCR Take 20 mEq by mouth every other day.    Yes [provider]  pravastatin (PRAVACHOL) 40 MG tablet Take 40 mg by mouth daily.  03/10/18  Yes [provider]    Physical Exam: Vitals:   05/20/19  1345 05/20/19 1400 05/20/19 1410 05/20/19 1415  BP:      Pulse: 93 99 91 95  Resp: 15 18 19 13   Temp:      TempSrc:      SpO2: 97% 93% 95% 97%  Weight:      Height:        Constitutional: NAD, calm, comfortable, hard of hearing, confused Vitals:   05/20/19 1345 05/20/19 1400 05/20/19 1410 05/20/19 1415  BP:      Pulse: 93 99 91 95  Resp: 15 18 19 13   Temp:      TempSrc:      SpO2: 97% 93% 95% 97%  Weight:      Height:       Eyes: lids and conjunctivae normal ENMT: Mucous membranes are moist.  Neck: normal, supple Respiratory: clear to auscultation bilaterally. Normal respiratory effort. No accessory muscle use.  Cardiovascular: Regular rate and rhythm, no murmurs. No extremity edema. Abdomen: no tenderness, no distention. Bowel sounds positive.  Musculoskeletal:  No joint deformity upper and lower extremities.   Skin: no rashes, lesions, ulcers.  Psychiatric: Confused and difficult to assess  Labs on Admission: I have personally reviewed following labs and imaging studies  CBC: Recent Labs  Lab 05/20/19 1028  WBC 5.3  NEUTROABS 2.5  HGB 10.2*  HCT 33.0*  MCV 97.3  PLT 90*   Basic Metabolic Panel: Recent Labs  Lab 05/20/19 1028  NA 140  K 3.9  CL 111  CO2 21*  GLUCOSE 114*  BUN 14  CREATININE 1.11*  CALCIUM 8.9   GFR: Estimated Creatinine Clearance: 35.2 mL/min (A) (by C-G formula based on SCr of 1.11 mg/dL (H)). Liver Function Tests: Recent Labs  Lab 05/20/19 1028  AST 39  ALT 23  ALKPHOS 112  BILITOT 1.8*  PROT 6.4*  ALBUMIN 2.5*   Recent Labs  Lab 05/20/19 1028  LIPASE 58*   No results for input(s): AMMONIA in the last 168 hours. Coagulation Profile: No results for input(s): INR, PROTIME in the last 168 hours. Cardiac Enzymes: No results for input(s): CKTOTAL, CKMB, CKMBINDEX, TROPONINI in the last 168 hours. BNP (last 3 results) No results for input(s): PROBNP in the last 8760 hours. HbA1C: No results for input(s): HGBA1C in the  last 72 hours. CBG: No results for input(s): GLUCAP in the last 168 hours. Lipid Profile: No results for input(s): CHOL, HDL, LDLCALC, TRIG, CHOLHDL, LDLDIRECT in the last 72 hours. Thyroid Function Tests: No results for input(s): TSH, T4TOTAL, FREET4, T3FREE, THYROIDAB in the last 72 hours. Anemia Panel: No results for input(s): VITAMINB12, FOLATE, FERRITIN, TIBC, IRON, RETICCTPCT in the last 72 hours. Urine analysis:    Component Value Date/Time   COLORURINE YELLOW 05/20/2019 1028   APPEARANCEUR HAZY (A) 05/20/2019 1028   LABSPEC 1.018 05/20/2019 1028   PHURINE 5.0 05/20/2019 1028   GLUCOSEU NEGATIVE 05/20/2019 1028   HGBUR  NEGATIVE 05/20/2019 1028   BILIRUBINUR NEGATIVE 05/20/2019 1028   KETONESUR NEGATIVE 05/20/2019 1028   PROTEINUR NEGATIVE 05/20/2019 1028   UROBILINOGEN 2.0 (H) 07/19/2015 1045   NITRITE NEGATIVE 05/20/2019 1028   LEUKOCYTESUR TRACE (A) 05/20/2019 1028    Radiological Exams on Admission: Ct Head Wo Contrast  Result Date: 05/20/2019 CLINICAL DATA:  Altered mental status.  Multiple recent falls. EXAM: CT HEAD WITHOUT CONTRAST TECHNIQUE: Contiguous axial images were obtained from the base of the skull through the vertex without intravenous contrast. COMPARISON:  CT abdomen pelvis dated May 01, 2019. FINDINGS: Brain: No evidence of acute infarction, hemorrhage, hydrocephalus, extra-axial collection or mass lesion/mass effect. Stable mild-to-moderate atrophy. Vascular: Atherosclerotic vascular calcification of the carotid siphons. No hyperdense vessel. Skull: Normal. Negative for fracture or focal lesion. Sinuses/Orbits: No acute finding. Other: None. IMPRESSION: 1.  No acute intracranial abnormality. Electronically Signed   By: Obie Dredge M.D.   On: 05/20/2019 13:18   Ct Abdomen Pelvis W Contrast  Result Date: 05/20/2019 CLINICAL DATA:  Recurrent falls. The patient was found down 05/18/2019. EXAM: CT ABDOMEN AND PELVIS WITH CONTRAST TECHNIQUE: Multidetector CT  imaging of the abdomen and pelvis was performed using the standard protocol following bolus administration of intravenous contrast. CONTRAST:  75 mL OMNIPAQUE IOHEXOL 300 MG/ML  SOLN COMPARISON:  None. FINDINGS: Lower chest: Mild basilar atelectasis on the left. Lung bases otherwise clear. No pleural or pericardial effusion. Cardiomegaly. Hepatobiliary: No focal liver abnormality is seen. Status post cholecystectomy. No biliary dilatation. The dome of the liver is just off the superior margin of the scan. Pancreas: Unremarkable. No pancreatic ductal dilatation or surrounding inflammatory changes. Spleen: Normal in size without focal abnormality. Adrenals/Urinary Tract: The adrenal glands appear normal. Stomach/Bowel: The patient has a few small renal cysts bilaterally. There is some scar in the lower pole of the right kidney. Ureters and urinary bladder are unremarkable. Diverticulosis is worst in the sigmoid colon. No diverticulitis. A small hiatal hernia is seen. Vascular/Lymphatic: Aortic atherosclerosis. Aneurysm of the abdominal aorta measures up to 4.1 cm. No hemorrhage. No enlarged abdominal or pelvic lymph nodes. Reproductive: Uterus and bilateral adnexa are unremarkable. Other: Small fat containing supraumbilical hernia is noted. Musculoskeletal: No acute or focal abnormality. Convex right scoliosis and multilevel spondylosis noted. The patient is status post left hip replacement. IMPRESSION: No acute abnormality abdomen or pelvis. 4.1 cm abdominal aortic aneurysm. Recommend followup by ultrasound in 1 year. This recommendation follows ACR consensus guidelines: White Paper of the ACR Incidental Findings Committee II on Vascular Findings. J Am Coll Radiol 2013; 10:789-794. Aortic aneurysm NOS (ICD10-I71.9) Atherosclerosis. Diverticulosis without diverticulitis. Small fat containing supraumbilical hernia. Small hiatal hernia. Electronically Signed   By: Drusilla Kanner M.D.   On: 05/20/2019 13:19   Dg  Chest Port 1 View  Result Date: 05/20/2019 CLINICAL DATA:  Altered mental status, found on floor reside bed on Sunday morning, multiple falls, history hypertension, GERD EXAM: PORTABLE CHEST 1 VIEW COMPARISON:  Portable exam 1038 hours compared to 05/01/2019 FINDINGS: Rotated to the LEFT. Enlargement of cardiac silhouette. Mediastinal contours and pulmonary vascularity normal. Atherosclerotic calcification aorta. Bibasilar opacities favor atelectasis. Remaining lungs clear. No pleural effusion or pneumothorax. Bones demineralized. IMPRESSION: Enlargement of cardiac silhouette with probable bibasilar atelectasis. Electronically Signed   By: Ulyses Southward M.D.   On: 05/20/2019 11:04    EKG: Independently reviewed. 96bpm with SR and ventricular trigemny.  Assessment/Plan Principal Problem:   Acute encephalopathy Active Problems:   Abdominal aortic aneurysm (HCC)  Essential hypertension   Neuropathy   Dementia without behavioral disturbance (HCC)    Acute encephalopathy unknown etiology -Appears to be related to dementia progression -We will order brain MRI to help rule out other etiology such as CVA -Ordered TSH, ammonia, RPR, B12, and EEG -Consider neurology evaluation as needed -PT evaluation with fall precautions given recurrent falls; will likely require placement -Haldol as needed for any agitation  Abdominal aortic aneurysm -Incidentally noted on CT imaging with recommendations to follow-up via ultrasound in 1 year  Essential hypertension with CKD stage III -Continue lisinopril and hold Lasix for now with gentle IVF  Peripheral neuropathy -Withhold gabapentin until mentation improves  GERD -Continue PPI  Dyslipidemia -Continue statin   DVT prophylaxis: SCDs Code Status: Full Family Communication: Spoke with son on phone Disposition Plan:Admit for encephalopathy workup and will require placement prior to discharge Consults called:None Admission status: Inpatient, Tele    Beverley Allender Hoover BrunetteD Winston Misner DO [facility] Pager [phone]  If 7PM-7AM, please contact night-coverage [email or web address] Password [email or web address]  05/20/2019, 2:37 PM

## 2019-05-20 NOTE — ED Notes (Signed)
Date and time results received: 05/20/19 1122 (use smartphrase ".now" to insert current time)  Test: Lactic Acid Critical Value: 2.0  Name of Provider Notified: Dr Rogene Houston Orders Received? Or Actions Taken?: NA

## 2019-05-20 NOTE — Progress Notes (Signed)
EEG Complete  Results Pending 

## 2019-05-20 NOTE — ED Provider Notes (Signed)
[facility] EMERGENCY DEPARTMENT Provider Note   CSN: [id number] Arrival date & time: 05/20/19  [id number]     History   Chief Complaint Chief Complaint  Patient presents with   Altered Mental Status    HPI Kellie NearingFrances I Young is a 85 y.o. female.     Patient brought in by family for altered mental status.  Patient was found on the floor Sunday morning.  Must of fallen out of bed Saturday going into Sunday.  Since that time patient's had waxing and waning confusion fairly significant confusion to the point where she will eat napkins and paper plates but will need food.  Patient also very unsteady on her feet.  Multiple falls.  But no obvious focal neuro deficit.  Family denies any fevers or cough.  Patient lives by herself.  They state that this change in and confusion is new Saturday going into Sunday.     Past Medical History:  Diagnosis Date   GERD (gastroesophageal reflux disease)    Hyperlipidemia    Hypertension    Neuromuscular disorder [facility]HCC)     Patient Active Problem List   Diagnosis Date Noted   Acute encephalopathy 05/20/2019   Dementia without behavioral disturbance (HCC) 05/20/2019   Rectal bleeding 09/27/2017   Hemorrhoids 09/27/2017   Esophageal dysmotility 03/29/2017   Sepsis (HCC) 03/22/2017   Acute lower UTI 03/22/2017   Community acquired pneumonia 03/22/2017   Hip fracture (HCC) 07/16/2015   Aortic heart murmur 07/16/2015   Chronic CHF (HCC) 07/16/2015   Essential hypertension 07/16/2015   Neuropathy 07/16/2015   Abdominal aortic aneurysm (HCC) 06/17/2012   Aftercare following surgery of the circulatory system, NEC 06/17/2012    Past Surgical History:  Procedure Laterality Date   ABDOMINAL AORTIC ANEURYSM REPAIR     BREAST BIOPSY     CHOLECYSTECTOMY     INGUINAL HERNIA REPAIR     TOTAL HIP ARTHROPLASTY       OB History   No obstetric history on file.      Home Medications    Prior to Admission medications     Medication Sig Start Date End Date Taking? Authorizing Provider  furosemide (LASIX) 20 MG tablet Take 20 mg by mouth daily.   Yes [provider]  gabapentin (NEURONTIN) 100 MG capsule Take 100 mg by mouth 2 (two) times daily. For Neuropathy   Yes [provider]  gabapentin (NEURONTIN) 300 MG capsule Take 300 mg by mouth at bedtime. For neuropathy   Yes [provider]  hydrocortisone (ANUSOL-HC) 2.5 % rectal cream Place 1 application rectally 2 (two) times daily. Patient taking differently: Place 1 application rectally 2 (two) times daily as needed for hemorrhoids or anal itching.  09/27/17  Yes Anice PaganiniGill, Eric A, NP  lisinopril (PRINIVIL,ZESTRIL) 10 MG tablet Take 10 mg by mouth daily.   Yes [provider]  pantoprazole (PROTONIX) 40 MG tablet Take 1 tablet (40 mg total) by mouth 2 (two) times daily. Patient taking differently: Take 40 mg by mouth daily.  03/27/17  Yes Regalado, Belkys A, MD  Potassium Chloride ER 20 MEQ TBCR Take 20 mEq by mouth every other day.    Yes [provider]  pravastatin (PRAVACHOL) 40 MG tablet Take 40 mg by mouth daily.  03/10/18  Yes [provider]    Family History Family History  Problem Relation Age of Onset   Heart disease Father     Social History Social History   Tobacco Use   Smoking  status: Former Smoker    Packs/day: 0.50    Types: Cigarettes    Quit date: 11/28/1991    Years since quitting: 27.4   Smokeless tobacco: Never Used  Substance Use Topics   Alcohol use: No   Drug use: No     Allergies   Sulfa antibiotics and Cephalexin   Review of Systems Review of Systems  Unable to perform ROS: Mental status change  Constitutional: Negative for fever.  HENT: Positive for hearing loss. Negative for congestion.   Respiratory: Negative for cough.   Skin: Negative for rash.  Hematological: Does not bruise/bleed easily.  Psychiatric/Behavioral: Positive for confusion.     Physical  Exam Updated Vital Signs BP (!) 147/69    Pulse (!) 101    Temp 98.4 F (36.9 C) (Oral)    Resp 18    Ht 1.753 m (5\' 9" )    Wt 78.9 kg    SpO2 94%    BMI 25.69 kg/m   Physical Exam Vitals signs and nursing note reviewed.  Constitutional:      General: She is not in acute distress.    Appearance: Normal appearance. She is well-developed.  HENT:     Head: Normocephalic and atraumatic.  Eyes:     Extraocular Movements: Extraocular movements intact.     Conjunctiva/sclera: Conjunctivae normal.     Pupils: Pupils are equal, round, and reactive to light.  Neck:     Musculoskeletal: Normal range of motion and neck supple.  Cardiovascular:     Rate and Rhythm: Normal rate and regular rhythm.     Heart sounds: No murmur.  Pulmonary:     Effort: Pulmonary effort is normal. No respiratory distress.     Breath sounds: Normal breath sounds.  Abdominal:     Palpations: Abdomen is soft.     Tenderness: There is no abdominal tenderness.  Musculoskeletal: Normal range of motion.  Skin:    General: Skin is warm and dry.  Neurological:     Mental Status: She is alert. She is disoriented.     Cranial Nerves: Cranial nerve deficit present.     Comments: Patient very hard of hearing but is awake.  Will follow some commands.  No obvious focal deficit.      ED Treatments / Results  Labs (all labs ordered are listed, but only abnormal results are displayed) Labs Reviewed  URINALYSIS, ROUTINE W REFLEX MICROSCOPIC - Abnormal; Notable for the following components:      Result Value   APPearance HAZY (*)    Leukocytes,Ua TRACE (*)    Bacteria, UA RARE (*)    All other components within normal limits  BRAIN NATRIURETIC PEPTIDE - Abnormal; Notable for the following components:   B Natriuretic Peptide 323.0 (*)    All other components within normal limits  COMPREHENSIVE METABOLIC PANEL - Abnormal; Notable for the following components:   CO2 21 (*)    Glucose, Bld 114 (*)    Creatinine, Ser  1.11 (*)    Total Protein 6.4 (*)    Albumin 2.5 (*)    Total Bilirubin 1.8 (*)    GFR calc non Af Amer 44 (*)    GFR calc Af Amer 51 (*)    All other components within normal limits  LIPASE, BLOOD - Abnormal; Notable for the following components:   Lipase 58 (*)    All other components within normal limits  CBC WITH DIFFERENTIAL/PLATELET - Abnormal; Notable for the following components:   RBC  3.39 (*)    Hemoglobin 10.2 (*)    HCT 33.0 (*)    RDW 17.9 (*)    Platelets 90 (*)    All other components within normal limits  LACTIC ACID, PLASMA - Abnormal; Notable for the following components:   Lactic Acid, Venous 2.0 (*)    All other components within normal limits  CULTURE, BLOOD (ROUTINE X 2)  SARS CORONAVIRUS 2 (HOSPITAL ORDER, PERFORMED IN [facility])  TSH  VITAMIN B12  RPR    EKG EKG Interpretation  Date/Time:  Tuesday May 20 2019 10:11:28 EDT Ventricular Rate:  96 PR Interval:    QRS Duration: 78 QT Interval:  363 QTC Calculation: 459 R Axis:   8 Text Interpretation:  Sinus tachycardia Ventricular trigeminy Confirmed by Vanetta MuldersZackowski, Decarla Siemen [phone]([id number]) on 05/20/2019 10:44:23 AM   Radiology Ct Head Wo Contrast  Result Date: 05/20/2019 CLINICAL DATA:  Altered mental status.  Multiple recent falls. EXAM: CT HEAD WITHOUT CONTRAST TECHNIQUE: Contiguous axial images were obtained from the base of the skull through the vertex without intravenous contrast. COMPARISON:  CT abdomen pelvis dated May 01, 2019. FINDINGS: Brain: No evidence of acute infarction, hemorrhage, hydrocephalus, extra-axial collection or mass lesion/mass effect. Stable mild-to-moderate atrophy. Vascular: Atherosclerotic vascular calcification of the carotid siphons. No hyperdense vessel. Skull: Normal. Negative for fracture or focal lesion. Sinuses/Orbits: No acute finding. Other: None. IMPRESSION: 1.  No acute intracranial abnormality. Electronically Signed   By: Obie DredgeWilliam T Derry M.D.   On: 05/20/2019  13:18   Ct Abdomen Pelvis W Contrast  Result Date: 05/20/2019 CLINICAL DATA:  Recurrent falls. The patient was found down 05/18/2019. EXAM: CT ABDOMEN AND PELVIS WITH CONTRAST TECHNIQUE: Multidetector CT imaging of the abdomen and pelvis was performed using the standard protocol following bolus administration of intravenous contrast. CONTRAST:  75 mL OMNIPAQUE IOHEXOL 300 MG/ML  SOLN COMPARISON:  None. FINDINGS: Lower chest: Mild basilar atelectasis on the left. Lung bases otherwise clear. No pleural or pericardial effusion. Cardiomegaly. Hepatobiliary: No focal liver abnormality is seen. Status post cholecystectomy. No biliary dilatation. The dome of the liver is just off the superior margin of the scan. Pancreas: Unremarkable. No pancreatic ductal dilatation or surrounding inflammatory changes. Spleen: Normal in size without focal abnormality. Adrenals/Urinary Tract: The adrenal glands appear normal. Stomach/Bowel: The patient has a few small renal cysts bilaterally. There is some scar in the lower pole of the right kidney. Ureters and urinary bladder are unremarkable. Diverticulosis is worst in the sigmoid colon. No diverticulitis. A small hiatal hernia is seen. Vascular/Lymphatic: Aortic atherosclerosis. Aneurysm of the abdominal aorta measures up to 4.1 cm. No hemorrhage. No enlarged abdominal or pelvic lymph nodes. Reproductive: Uterus and bilateral adnexa are unremarkable. Other: Small fat containing supraumbilical hernia is noted. Musculoskeletal: No acute or focal abnormality. Convex right scoliosis and multilevel spondylosis noted. The patient is status post left hip replacement. IMPRESSION: No acute abnormality abdomen or pelvis. 4.1 cm abdominal aortic aneurysm. Recommend followup by ultrasound in 1 year. This recommendation follows ACR consensus guidelines: White Paper of the ACR Incidental Findings Committee II on Vascular Findings. J Am Coll Radiol 2013; 10:789-794. Aortic aneurysm NOS  (ICD10-I71.9) Atherosclerosis. Diverticulosis without diverticulitis. Small fat containing supraumbilical hernia. Small hiatal hernia. Electronically Signed   By: Drusilla Kannerhomas  Dalessio M.D.   On: 05/20/2019 13:19   Dg Chest Port 1 View  Result Date: 05/20/2019 CLINICAL DATA:  Altered mental status, found on floor reside bed on Sunday morning, multiple falls, history hypertension, GERD EXAM: PORTABLE  CHEST 1 VIEW COMPARISON:  Portable exam 1038 hours compared to 05/01/2019 FINDINGS: Rotated to the LEFT. Enlargement of cardiac silhouette. Mediastinal contours and pulmonary vascularity normal. Atherosclerotic calcification aorta. Bibasilar opacities favor atelectasis. Remaining lungs clear. No pleural effusion or pneumothorax. Bones demineralized. IMPRESSION: Enlargement of cardiac silhouette with probable bibasilar atelectasis. Electronically Signed   By: Ulyses SouthwardMark  Boles M.D.   On: 05/20/2019 11:04    Procedures Procedures (including critical care time)  CRITICAL CARE Performed by: Vanetta MuldersScott Loryn Haacke Total critical care time: 30 minutes Critical care time was exclusive of separately billable procedures and treating other patients. Critical care was necessary to treat or prevent imminent or life-threatening deterioration. Critical care was time spent personally by me on the following activities: development of treatment plan with patient and/or surrogate as well as nursing, discussions with consultants, evaluation of patient's response to treatment, examination of patient, obtaining history from patient or surrogate, ordering and performing treatments and interventions, ordering and review of laboratory studies, ordering and review of radiographic studies, pulse oximetry and re-evaluation of patient's condition.   Medications Ordered in ED Medications  0.9 %  sodium chloride infusion ( Intravenous Rate/Dose Verify 05/20/19 1505)  lisinopril (ZESTRIL) tablet 10 mg (has no administration in time range)    pravastatin (PRAVACHOL) tablet 40 mg (has no administration in time range)  pantoprazole (PROTONIX) EC tablet 40 mg (has no administration in time range)  acetaminophen (TYLENOL) tablet 650 mg (has no administration in time range)    Or  acetaminophen (TYLENOL) suppository 650 mg (has no administration in time range)  ondansetron (ZOFRAN) tablet 4 mg (has no administration in time range)    Or  ondansetron (ZOFRAN) injection 4 mg (has no administration in time range)  thiamine (VITAMIN B-1) tablet 100 mg (has no administration in time range)  haloperidol lactate (HALDOL) injection 2 mg (has no administration in time range)  aztreonam (AZACTAM) 2 g in sodium chloride 0.9 % 100 mL IVPB ( Intravenous Stopped 05/20/19 1339)  metroNIDAZOLE (FLAGYL) IVPB 500 mg ( Intravenous Stopped 05/20/19 1439)  iohexol (OMNIPAQUE) 300 MG/ML solution 75 mL (75 mLs Intravenous Contrast Given 05/20/19 1253)     Initial Impression / Assessment and Plan / ED Course  I have reviewed the triage vital signs and the nursing notes.  Pertinent labs & imaging results that were available during my care of the patient were reviewed by me and considered in my medical decision making (see chart for details).        Work-up for the altered mental status without acute findings.  No infectious source identified.  Head CT without acute findings.  Hospitals were planning on doing MRI to evaluate the change in mental status.  I thought clinically that patient was going to have a urinary tract infection.  But urinalysis was normal.  Patient did have an elevated lactic acid at 2.  So based on that patient had blood cultures and was treated with broad-spectrum antibiotics awaiting the rest.  Patient did not have a requirement for 30 cc/kg fluid.  Not hypotensive.  No fever.  Just the altered mental status.  Family states that patient was fine until Saturday going into Sunday.  Apparently she had a fall next to her bed she was found  Sunday morning on the floor.  Since that time she has had waxing and waning significant confusion to the point where she will eat napkins the paper plates.  Very unsteady on her feet and has had several falls but not like  1 arm or one leg not working.'s at other times she is quite lucent and baseline according to family.  Final Clinical Impressions(s) / ED Diagnoses   Final diagnoses:  Altered mental status, unspecified altered mental status type    ED Discharge Orders    None       Vanetta Mulders, MD 05/20/19 1535

## 2019-05-20 NOTE — ED Notes (Signed)
Pt ambulated with one assist to BR and back; at one point while standing, pt became a little unsteady

## 2019-05-20 NOTE — Procedures (Signed)
EEG Report  Clinical History: Altered mental status.  Technical Summary:  A 19 channel digital EEG recording was performed using the 10-20 international system of electrode placement.  Bipolar and Referential montages were used.  The total recording time was approx 20 minutes.  Findings:  There is no posterior dominant rhythm.  Background frequencies are in the 5-6 Hz range symmetrically.  No focal slowing is present. There are intermittent synchronous and symmetrical triphasic waveforms.  There are no epileptiform discharges or electrographic seizures present.  Sleep is not recorded.    Impression:  This is an abnormal EEG.  There is evidence of moderate generalized slowing of brain activity and characteristic waveforms suggestive of a toxic, metabolic, or infectious encephalopathy.  Clinical correlation is recommended.    Rogue Jury, MS, MD

## 2019-05-20 NOTE — ED Notes (Signed)
Pt to MRI

## 2019-05-21 DIAGNOSIS — G629 Polyneuropathy, unspecified: Secondary | ICD-10-CM

## 2019-05-21 DIAGNOSIS — I714 Abdominal aortic aneurysm, without rupture: Secondary | ICD-10-CM

## 2019-05-21 DIAGNOSIS — I10 Essential (primary) hypertension: Secondary | ICD-10-CM

## 2019-05-21 DIAGNOSIS — R5381 Other malaise: Secondary | ICD-10-CM

## 2019-05-21 DIAGNOSIS — G9340 Encephalopathy, unspecified: Secondary | ICD-10-CM

## 2019-05-21 LAB — CBC
HCT: 27.9 % — ABNORMAL LOW (ref 36.0–46.0)
Hemoglobin: 8.4 g/dL — ABNORMAL LOW (ref 12.0–15.0)
MCH: 30 pg (ref 26.0–34.0)
MCHC: 30.1 g/dL (ref 30.0–36.0)
MCV: 99.6 fL (ref 80.0–100.0)
Platelets: 69 10*3/uL — ABNORMAL LOW (ref 150–400)
RBC: 2.8 MIL/uL — ABNORMAL LOW (ref 3.87–5.11)
RDW: 17.9 % — ABNORMAL HIGH (ref 11.5–15.5)
WBC: 4.6 10*3/uL (ref 4.0–10.5)
nRBC: 0 % (ref 0.0–0.2)

## 2019-05-21 LAB — BASIC METABOLIC PANEL
Anion gap: 4 — ABNORMAL LOW (ref 5–15)
BUN: 13 mg/dL (ref 8–23)
CO2: 23 mmol/L (ref 22–32)
Calcium: 8.2 mg/dL — ABNORMAL LOW (ref 8.9–10.3)
Chloride: 116 mmol/L — ABNORMAL HIGH (ref 98–111)
Creatinine, Ser: 0.69 mg/dL (ref 0.44–1.00)
GFR calc Af Amer: >60 mL/min (ref >60)
GFR calc non Af Amer: >60 mL/min (ref >60)
Glucose, Bld: 83 mg/dL (ref 70–99)
Potassium: 3.6 mmol/L (ref 3.5–5.1)
Sodium: 143 mmol/L (ref 135–145)

## 2019-05-21 LAB — MAGNESIUM: Magnesium: 1.8 mg/dL (ref 1.7–2.4)

## 2019-05-21 LAB — AMMONIA: Ammonia: 83 umol/L — ABNORMAL HIGH (ref 9–35)

## 2019-05-21 MED ORDER — SODIUM CHLORIDE 0.9 % IV SOLN
INTRAVENOUS | Status: AC
  Administered 2019-05-21: 19:00:00 via INTRAVENOUS

## 2019-05-21 MED ORDER — LORAZEPAM 2 MG/ML IJ SOLN
1.0000 mg | Freq: Once | INTRAMUSCULAR | Status: AC
  Administered 2019-05-21: 1 mg via INTRAVENOUS
  Filled 2019-05-21: qty 1

## 2019-05-21 MED ORDER — LACTULOSE 10 GM/15ML PO SOLN
20.0000 g | Freq: Once | ORAL | Status: AC
  Administered 2019-05-21: 20 g via ORAL
  Filled 2019-05-21: qty 30

## 2019-05-21 MED ORDER — HALOPERIDOL LACTATE 5 MG/ML IJ SOLN
2.0000 mg | Freq: Three times a day (TID) | INTRAMUSCULAR | Status: DC | PRN
  Administered 2019-05-21 – 2019-05-25 (×2): 2 mg via INTRAVENOUS
  Filled 2019-05-21 (×2): qty 1

## 2019-05-21 NOTE — Evaluation (Signed)
Physical Therapy Evaluation Patient Details Name: [name] MRN: [id number] DOB: 14-Feb-1927 Today's Date: 05/21/2019   History of Present Illness  [name] is a 85 y.o. female with medical history significant for GERD, hypertension, neuropathy, dyslipidemia, and dementia who has been noted to have worsening confusion and disorientation at home.  She cannot remember who she is and where she is at times as well as recognize family members.  There have been no noted behavioral disturbances, but patient has had some weakness to her legs with associated frequent falls.  Her family members check up on her on a regular basis and her niece stays with her throughout the day and her son usually visits in the evenings.  They help manage her medications and state that it is highly unlikely that she would have overdosed on any of her medications.  They deny any upper respiratory symptoms, fevers, chills, or other issues.  The patient does live on her own despite the fact that her family members visit frequently and they are afraid that her dementia may be progressing where she may have some other acute issue and will likely require placement to SNF.  No urinary incontinence or tongue biting or seizure-like activity noted at home.  No note of any toxic ingestion noted.    Clinical Impression  Patient limited for functional mobility as stated below secondary to BLE weakness, fatigue and poor standing balance.  Patient demonstrates very impulsive behavior when having to urinate, requires constant verbal/tactile cueing during transfers to [facility] for proper hand placement with fair/poor carryover, able to ambulate in hallway, but had difficulty making turns requiring assistance to turn RW.  Patient put back to bed with sitter in room after therapy.   Patient will benefit from continued physical therapy in hospital and recommended venue below to increase strength, balance, endurance for safe ADLs and gait.      Follow Up Recommendations SNF    Equipment Recommendations       Recommendations for Other Services       Precautions / Restrictions Precautions Precautions: Fall Restrictions Weight Bearing Restrictions: No      Mobility  Bed Mobility Overal bed mobility: Needs Assistance Bed Mobility: Sit to Supine;Supine to Sit     Supine to sit: Min assist Sit to supine: Min assist   General bed mobility comments: labored movement, increased time  Transfers Overall transfer level: Needs assistance Equipment used: Rolling walker (2 wheeled) Transfers: Sit to/from [facility] Sit to Stand: Min assist Stand pivot transfers: Mod assist       General transfer comment: slow labored movement and much verbal/tactile cueing for proper hand placement  Ambulation/Gait Ambulation/Gait assistance: Min assist;Mod assist Gait Distance (Feet): 40 Feet Assistive device: Rolling walker (2 wheeled) Gait Pattern/deviations: Decreased step length - right;Decreased step length - left;Decreased stride length Gait velocity: decreased   General Gait Details: slightly labored slow cadence, very impulsive and has difficulty making turns with near loss of balance  Stairs            Wheelchair Mobility    Modified Rankin (Stroke Patients Only)       Balance Overall balance assessment: Needs assistance Sitting-balance support: Feet supported;No upper extremity supported Sitting balance-Leahy Scale: Fair     Standing balance support: During functional activity;No upper extremity supported Standing balance-Leahy Scale: Poor Standing balance comment: fair using RW  Pertinent Vitals/Pain Pain Assessment: Faces Faces Pain Scale: No hurt    Home Living Family/patient expects to be discharged to:: Private residence Living Arrangements: Alone               Additional Comments: Patient is a poor historian and unable to answer  questions possibly due to confusion    Prior Function Level of Independence: Needs assistance   Gait / Transfers Assistance Needed: household ambulator with RW           Hand Dominance        Extremity/Trunk Assessment   Upper Extremity Assessment Upper Extremity Assessment: Generalized weakness    Lower Extremity Assessment Lower Extremity Assessment: Generalized weakness    Cervical / Trunk Assessment Cervical / Trunk Assessment: Kyphotic  Communication   Communication: Other (comment)(Patient mostly nonverbal and does not answer most questions)  Cognition Arousal/Alertness: Awake/alert Behavior During Therapy: Flat affect;Impulsive Overall Cognitive Status: History of cognitive impairments - at baseline                                 General Comments: requires constant verbal/tactile cueing to follow directions      General Comments      Exercises     Assessment/Plan    PT Assessment Patient needs continued PT services  PT Problem List Decreased strength;Decreased activity tolerance;Decreased balance;Decreased mobility       PT Treatment Interventions Gait training;Stair training;Functional mobility training;Therapeutic activities;Therapeutic exercise;Patient/family education    PT Goals (Current goals can be found in the Care Plan section)  Acute Rehab PT Goals Patient Stated Goal: not stated PT Goal Formulation: With patient Time For Goal Achievement: 06/04/19 Potential to Achieve Goals: Good    Frequency Min 3X/week   Barriers to discharge        Co-evaluation               AM-PAC PT "6 Clicks" Mobility  Outcome Measure Help needed turning from your back to your side while in a flat bed without using bedrails?: A Little Help needed moving from lying on your back to sitting on the side of a flat bed without using bedrails?: A Little Help needed moving to and from a bed to a chair (including a wheelchair)?: A Lot Help  needed standing up from a chair using your arms (e.g., wheelchair or bedside chair)?: A Lot Help needed to walk in hospital room?: A Lot Help needed climbing 3-5 steps with a railing? : A Lot 6 Click Score: 14    End of Session Equipment Utilized During Treatment: Gait belt Activity Tolerance: Patient tolerated treatment well;Patient limited by fatigue Patient left: in bed;with call bell/phone within reach;with nursing/sitter in room Nurse Communication: Mobility status PT Visit Diagnosis: Unsteadiness on feet (R26.81);Other abnormalities of gait and mobility (R26.89);Muscle weakness (generalized) (M62.81)    Time: 1540-0867 PT Time Calculation (min) (ACUTE ONLY): 31 min   Charges:   PT Evaluation $PT Eval Moderate Complexity: 1 Mod PT Treatments $Therapeutic Activity: 23-37 mins        3:29 PM, 05/21/19 Ocie Bob, MPT Physical Therapist with [facility] [phone] office [phone] mobile phone

## 2019-05-21 NOTE — Care Management Important Message (Signed)
Important Message  Patient Details  Name: [name] MRN: [id number] Date of Birth: 1927-07-27   Medicare Important Message Given:  Yes     [name] 05/21/2019, 2:22 PM

## 2019-05-21 NOTE — [place] FL2 (Signed)
[place] MEDICAID FL2 LEVEL OF CARE SCREENING TOOL     IDENTIFICATION  Patient Name: [name] Birthdate: [birth year] Sex: female Admission Date (Current Location): 05/20/2019  [facility] and [place] Number:  [employer] and Address:  [facility],  [address], [place] [address]      Provider Number: [id number]  Attending Physician Name and Address:  Vassie Loll, MD  Relative Name and Phone Number:       Current Level of Care: Hospital Recommended Level of Care: Skilled Nursing Facility Prior Approval Number:    Date Approved/Denied:   PASRR Number: [id number]  Discharge Plan: SNF    Current Diagnoses: Patient Active Problem List   Diagnosis Date Noted  . Acute encephalopathy 05/20/2019  . Dementia without behavioral disturbance (HCC) 05/20/2019  . Rectal bleeding 09/27/2017  . Hemorrhoids 09/27/2017  . Esophageal dysmotility 03/29/2017  . Sepsis (HCC) 03/22/2017  . Acute lower UTI 03/22/2017  . Community acquired pneumonia 03/22/2017  . Hip fracture (HCC) 07/16/2015  . Aortic heart murmur 07/16/2015  . Chronic CHF (HCC) 07/16/2015  . Essential hypertension 07/16/2015  . Neuropathy 07/16/2015  . Abdominal aortic aneurysm (HCC) 06/17/2012  . Aftercare following surgery of the circulatory system, NEC 06/17/2012    Orientation RESPIRATION BLADDER Height & Weight     Self, Place  Normal Incontinent Weight: 173 lb 15.1 oz (78.9 kg) Height:  5\' 9"  (175.3 cm)  BEHAVIORAL SYMPTOMS/MOOD NEUROLOGICAL BOWEL NUTRITION STATUS      Continent Diet(heart healthy)  AMBULATORY STATUS COMMUNICATION OF NEEDS Skin   Limited Assist Verbally Normal                       Personal Care Assistance Level of Assistance  Feeding, Bathing, Dressing Bathing Assistance: Limited assistance Feeding assistance: Independent Dressing Assistance: Limited assistance     Functional Limitations Info  Sight, Speech, Hearing Sight Info:  Adequate Hearing Info: Adequate Speech Info: Adequate    SPECIAL CARE FACTORS FREQUENCY  PT (By licensed PT)     PT Frequency: 5x/week              Contractures Contractures Info: Not present    Additional Factors Info  Code Status, Allergies Code Status Info: Full Code Allergies Info: Sulfa Antibiotics, Cephalexin           Current Medications (05/21/2019):  This is the current hospital active medication list Current Facility-Administered Medications  Medication Dose Route Frequency Provider Last Rate Last Dose  . acetaminophen (TYLENOL) tablet 650 mg  650 mg Oral Q6H PRN Sherryll Burger, Pratik D, DO       Or  . acetaminophen (TYLENOL) suppository 650 mg  650 mg Rectal Q6H PRN Sherryll Burger, Pratik D, DO      . haloperidol lactate (HALDOL) injection 2 mg  2 mg Intravenous Q6H PRN Sherryll Burger, Pratik D, DO   2 mg at 05/20/19 2145  . lisinopril (ZESTRIL) tablet 10 mg  10 mg Oral Daily Sherryll Burger, Pratik D, DO   10 mg at 05/21/19 0956  . ondansetron (ZOFRAN) tablet 4 mg  4 mg Oral Q6H PRN Sherryll Burger, Pratik D, DO       Or  . ondansetron (ZOFRAN) injection 4 mg  4 mg Intravenous Q6H PRN Sherryll Burger, Pratik D, DO      . pantoprazole (PROTONIX) EC tablet 40 mg  40 mg Oral Daily Sherryll Burger, Pratik D, DO   40 mg at 05/21/19 0956  . pravastatin (PRAVACHOL) tablet 40 mg  40  mg Oral Daily Heath Lark D, DO   40 mg at 05/21/19 0956  . thiamine (VITAMIN B-1) tablet 100 mg  100 mg Oral Daily Manuella Ghazi, Pratik D, DO   100 mg at 05/21/19 0623     Discharge Medications: Please see discharge summary for a list of discharge medications.  Relevant Imaging Results:  Relevant Lab Results:   Additional Information SSN:  [id number]  Ihor Gully, LCSW

## 2019-05-21 NOTE — Clinical Social Work Note (Signed)
Message left for patient's son requesting contact regarding discharge plan and SNF recommendation.      Ashanna Heinsohn, Clydene Pugh, LCSW

## 2019-05-21 NOTE — Progress Notes (Signed)
PROGRESS NOTE    [name]  ZOX:[id number] DOB: 03-30-1927 DOA: 05/20/2019 PCP: Elfredia Nevins, MD     Brief Narrative:  85 y.o. female with medical history significant for GERD, hypertension, neuropathy, dyslipidemia, and dementia who has been noted to have worsening confusion and disorientation at home.  She cannot remember who she is and where she is at times as well as recognize family members.  There have been no noted behavioral disturbances, but patient has had some weakness to her legs with associated frequent falls.  Her family members check up on her on a regular basis and her niece stays with her throughout the day and her son usually visits in the evenings.  They help manage her medications and state that it is highly unlikely that she would have overdosed on any of her medications.  They deny any upper respiratory symptoms, fevers, chills, or other issues.  The patient does live on her own despite the fact that her family members visit frequently and they are afraid that her dementia may be progressing where she may have some other acute issue and will likely require placement to SNF.  No urinary incontinence or tongue biting or seizure-like activity noted at home.  No note of any toxic ingestion noted.   ED Course: Vital signs are stable and patient is afebrile.  No leukocytosis noted on laboratory data and hemoglobin is 10.2 with platelet count of 90,000.  Creatinine is 1.11 which appears to be at patient's baseline.  Urine analysis negative for UTI, but lactic acid noted to be 2.0 and patient did receive aztreonam, Flagyl, and vancomycin in the emergency department.  CT of the head is negative for any acute abnormalities and CT of the abdomen and pelvis with no acute abnormalities and 4.1 cm AAA noted  Assessment & Plan: 1-Acute encephalopathy -Appears to be secondary to elevated ammonia level; patient found to be dehydrated and with underlying history of dementia also playing a  role. -Tinea gentle fluid resuscitation and supportive care -Minimize the use of sedative agents -For restlessness will use sitter as much as possible and provide reorientation. -Follow clinical response and improvement.  2-physical deconditioning -Evaluated by physical therapy -Recommended skilled nursing facility for short-term rehabilitation.  3-Abdominal aortic aneurysm (HCC) -No signs of rupture -Continue monitoring  4-Essential hypertension -Stable -Continue holding Lasix -Continue lisinopril.   -follow vital signs.  5-Neuropathy -Resume the use of gabapentin when mentation improved.  6-Dementia without behavioral disturbance (HCC) -Continue supportive care.  7-hyperlipidemia -Continue statins  8-acute kidney injury -Improving/resolved with the use of IV fluids -Will attempt to maintain adequate hydration. -Repeat basic metabolic panel in a.m. to follow electrolytes and renal function.  9-gastroesophageal reflux disease -Continue PPI.   DVT prophylaxis: SCDs Code Status: Full code Family Communication: Son over the phone. Disposition Plan: Remains inpatient, minimize sedative agents, will use sitter for agitation and reorientation.  Based on physical therapy recommendations will pursued skilled nursing facility for short-term rehab.  12 hours of gentle fluid resuscitation and the use of lactulose to address increased ammonia level.  Consultants:   None  Procedures:   See below for x-ray reports.  Antimicrobials:  Anti-infectives (From admission, onward)   Start     Dose/Rate Route Frequency Ordered Stop   05/21/19 1400  vancomycin (VANCOCIN) IVPB 1000 mg/200 mL premix  Status:  Discontinued     1,000 mg 200 mL/hr over 60 Minutes Intravenous Every 24 hours 05/20/19 1230 05/20/19 1501   05/20/19 2200  aztreonam (  AZACTAM) 1 g in sodium chloride 0.9 % 100 mL IVPB  Status:  Discontinued     1 g 200 mL/hr over 30 Minutes Intravenous Every 8 hours 05/20/19  1228 05/20/19 1501   05/20/19 1300  vancomycin (VANCOCIN) IVPB 1000 mg/200 mL premix  Status:  Discontinued     1,000 mg 200 mL/hr over 60 Minutes Intravenous Every 1 hr x 2 05/20/19 1225 05/20/19 1501   05/20/19 1230  aztreonam (AZACTAM) 2 g in sodium chloride 0.9 % 100 mL IVPB     2 g 200 mL/hr over 30 Minutes Intravenous  Once 05/20/19 1217 05/20/19 1339   05/20/19 1230  metroNIDAZOLE (FLAGYL) IVPB 500 mg     500 mg 100 mL/hr over 60 Minutes Intravenous  Once 05/20/19 1217 05/20/19 1439   05/20/19 1230  vancomycin (VANCOCIN) IVPB 1000 mg/200 mL premix  Status:  Discontinued     1,000 mg 200 mL/hr over 60 Minutes Intravenous  Once 05/20/19 1217 05/20/19 1225       Subjective: Afebrile, no chest pain, no shortness of breath.  Patient is restless and confused currently.  No nausea, no vomiting.  Found to be very deconditioned by physical therapy and with poor balance.  Objective: Vitals:   05/20/19 1640 05/20/19 2105 05/21/19 0434 05/21/19 0436  BP: (!) 177/89 (!) 164/75 (!) 111/55 (!) 111/55  Pulse: 95 99 80 81  Resp: 16 16 16 16   Temp: 98.5 F (36.9 C) 97.8 F (36.6 C)  (!) 97.5 F (36.4 C)  TempSrc: Oral Oral  Oral  SpO2: 98% 98% 97% 97%  Weight:      Height:        Intake/Output Summary (Last 24 hours) at 05/21/2019 1721 Last data filed at 05/21/2019 1718 Gross per 24 hour  Intake 0 ml  Output 502 ml  Net -502 ml   Filed Weights   05/20/19 1006  Weight: 78.9 kg    Examination: General exam: Very hard of hearing; afebrile, no chest pain, no shortness of breath, no nausea, no vomiting.  Patient oriented to person only.  Somnolent and having difficulty following commands. Respiratory system: Clear to auscultation. Respiratory effort normal. Cardiovascular system:RRR. No murmurs, rubs, gallops. Gastrointestinal system: Abdomen is nondistended, soft and nontender. No organomegaly or masses felt. Normal bowel sounds heard. Central nervous system: No focal neurological  deficits. Extremities: No C/C/E, +pedal pulses Skin: No rashes, lesions or ulcers Psychiatry: Judgement and insight appear impaired secondary to underlying dementia and current metabolic encephalopathy.     Data Reviewed: I have personally reviewed following labs and imaging studies  CBC: Recent Labs  Lab 05/20/19 1028 05/21/19 0522  WBC 5.3 4.6  NEUTROABS 2.5  --   HGB 10.2* 8.4*  HCT 33.0* 27.9*  MCV 97.3 99.6  PLT 90* 69*   Basic Metabolic Panel: Recent Labs  Lab 05/20/19 1028 05/21/19 0522  NA 140 143  K 3.9 3.6  CL 111 116*  CO2 21* 23  GLUCOSE 114* 83  BUN 14 13  CREATININE 1.11* 0.69  CALCIUM 8.9 8.2*  MG  --  1.8   GFR: Estimated Creatinine Clearance: 48.8 mL/min (by C-G formula based on SCr of 0.69 mg/dL).   Liver Function Tests: Recent Labs  Lab 05/20/19 1028  AST 39  ALT 23  ALKPHOS 112  BILITOT 1.8*  PROT 6.4*  ALBUMIN 2.5*   Recent Labs  Lab 05/20/19 1028  LIPASE 58*   Recent Labs  Lab 05/21/19 0522  AMMONIA 83*  Thyroid Function Tests: Recent Labs    05/20/19 1028  TSH 2.304   Anemia Panel: Recent Labs    05/20/19 1028  VITAMINB12 673   Urine analysis:    Component Value Date/Time   COLORURINE YELLOW 05/20/2019 1028   APPEARANCEUR HAZY (A) 05/20/2019 1028   LABSPEC 1.018 05/20/2019 1028   PHURINE 5.0 05/20/2019 1028   GLUCOSEU NEGATIVE 05/20/2019 1028   HGBUR NEGATIVE 05/20/2019 1028   BILIRUBINUR NEGATIVE 05/20/2019 1028   KETONESUR NEGATIVE 05/20/2019 1028   PROTEINUR NEGATIVE 05/20/2019 1028   UROBILINOGEN 2.0 (H) 07/19/2015 1045   NITRITE NEGATIVE 05/20/2019 1028   LEUKOCYTESUR TRACE (A) 05/20/2019 1028    Recent Results (from the past 240 hour(s))  Culture, blood (Routine X 2) w Reflex to ID Panel     Status: None (Preliminary result)   Collection Time: 05/20/19 10:54 AM   Specimen: BLOOD LEFT ARM  Result Value Ref Range Status   Specimen Description BLOOD LEFT ARM  Final   Special Requests   Final     BOTTLES DRAWN AEROBIC AND ANAEROBIC Blood Culture results may not be optimal due to an inadequate volume of blood received in culture bottles   Culture   Final    NO GROWTH < 24 HOURS Performed at [facility], [address] [place], [place] [address]    Report Status PENDING  Incomplete  SARS Coronavirus 2 (CEPHEID - Performed in [facility] lab), Hosp Order     Status: None   Collection Time: 05/20/19  1:58 PM   Specimen: Nasopharyngeal Swab  Result Value Ref Range Status   SARS Coronavirus 2 NEGATIVE NEGATIVE Final    Comment: (NOTE) If result is NEGATIVE SARS-CoV-2 target nucleic acids are NOT DETECTED. The SARS-CoV-2 RNA is generally detectable in upper and lower  respiratory specimens during the acute phase of infection. The lowest  concentration of SARS-CoV-2 viral copies this assay can detect is 250  copies / mL. A negative result does not preclude SARS-CoV-2 infection  and should not be used as the sole basis for treatment or other  patient management decisions.  A negative result may occur with  improper specimen collection / handling, submission of specimen other  than nasopharyngeal swab, presence of viral mutation(s) within the  areas targeted by this assay, and inadequate number of viral copies  (<250 copies / mL). A negative result must be combined with clinical  observations, patient history, and epidemiological information. If result is POSITIVE SARS-CoV-2 target nucleic acids are DETECTED. The SARS-CoV-2 RNA is generally detectable in upper and lower  respiratory specimens dur ing the acute phase of infection.  Positive  results are indicative of active infection with SARS-CoV-2.  Clinical  correlation with patient history and other diagnostic information is  necessary to determine patient infection status.  Positive results do  not rule out bacterial infection or co-infection with other viruses. If result is PRESUMPTIVE POSTIVE SARS-CoV-2 nucleic acids  MAY BE PRESENT.   A presumptive positive result was obtained on the submitted specimen  and confirmed on repeat testing.  While 2019 novel coronavirus  (SARS-CoV-2) nucleic acids may be present in the submitted sample  additional confirmatory testing may be necessary for epidemiological  and / or clinical management purposes  to differentiate between  SARS-CoV-2 and other Sarbecovirus currently known to infect humans.  If clinically indicated additional testing with an alternate test  methodology [phone]([id number]) is advised. The SARS-CoV-2 RNA is generally  detectable in upper and lower respiratory sp  ecimens during the acute  phase of infection. The expected result is Negative. Fact Sheet for Patients:  BoilerBrush.com.cy Fact Sheet for Healthcare Providers: https://pope.com/ This test is not yet approved or cleared by the [place] FDA and has been authorized for detection and/or diagnosis of SARS-CoV-2 by FDA under an Emergency Use Authorization (EUA).  This EUA will remain in effect (meaning this test can be used) for the duration of the COVID-19 declaration under Section 564(b)(1) of the Act, 21 U.S.C. section 360bbb-3(b)(1), unless the authorization is terminated or revoked sooner. Performed at [facility], [address] [place], [place] [address]      Radiology Studies: Ct Head Wo Contrast  Result Date: 05/20/2019 CLINICAL DATA:  Altered mental status.  Multiple recent falls. EXAM: CT HEAD WITHOUT CONTRAST TECHNIQUE: Contiguous axial images were obtained from the base of the skull through the vertex without intravenous contrast. COMPARISON:  CT abdomen pelvis dated May 01, 2019. FINDINGS: Brain: No evidence of acute infarction, hemorrhage, hydrocephalus, extra-axial collection or mass lesion/mass effect. Stable mild-to-moderate atrophy. Vascular: Atherosclerotic vascular calcification of the carotid siphons. No hyperdense vessel.  Skull: Normal. Negative for fracture or focal lesion. Sinuses/Orbits: No acute finding. Other: None. IMPRESSION: 1.  No acute intracranial abnormality. Electronically Signed   By: Obie Dredge M.D.   On: 05/20/2019 13:18   Mr Angio Head Wo Contrast  Result Date: 05/20/2019 CLINICAL DATA:  Altered level of consciousness.  Found on floor. EXAM: MRI HEAD WITHOUT CONTRAST MRA HEAD WITHOUT CONTRAST TECHNIQUE: Multiplanar, multiecho pulse sequences of the brain and surrounding structures were obtained without intravenous contrast. Angiographic images of the head were obtained using MRA technique without contrast. COMPARISON:  CT head 05/20/2019 FINDINGS: MRI HEAD FINDINGS Brain: Image quality degraded by significant motion. Negative for acute infarct. Negative for hemorrhage, mass, or fluid collection. Mild chronic microvascular ischemic type changes in the white matter. Vascular: Normal arterial flow voids with exception of the left vertebral artery which is not visualized. This may be hypoplastic. Skull and upper cervical spine: Negative Sinuses/Orbits: Paranasal sinuses clear.  Bilateral cataract surgery Other: None MRA HEAD FINDINGS Image quality degraded by motion. Right vertebral dominant supplying the basilar and widely patent. Left vertebral artery not visualized and may be hypoplastic. Posterior cerebral arteries patent bilaterally. Cerebellar arteries not well seen due to motion Internal carotid artery patent bilaterally without stenosis. Anterior and middle cerebral arteries patent. There is artifact obscuring vessel detail. IMPRESSION: 1. Image quality degraded significantly by motion 2. Negative for acute infarct.  No subdural hematoma or mass-effect. 3. MRA degraded by motion. No large vessel occlusion. Left vertebral artery appears hypoplastic. Electronically Signed   By: Marlan Palau M.D.   On: 05/20/2019 16:26   Mr Brain Wo Contrast  Result Date: 05/20/2019 CLINICAL DATA:  Altered level of  consciousness.  Found on floor. EXAM: MRI HEAD WITHOUT CONTRAST MRA HEAD WITHOUT CONTRAST TECHNIQUE: Multiplanar, multiecho pulse sequences of the brain and surrounding structures were obtained without intravenous contrast. Angiographic images of the head were obtained using MRA technique without contrast. COMPARISON:  CT head 05/20/2019 FINDINGS: MRI HEAD FINDINGS Brain: Image quality degraded by significant motion. Negative for acute infarct. Negative for hemorrhage, mass, or fluid collection. Mild chronic microvascular ischemic type changes in the white matter. Vascular: Normal arterial flow voids with exception of the left vertebral artery which is not visualized. This may be hypoplastic. Skull and upper cervical spine: Negative Sinuses/Orbits: Paranasal sinuses clear.  Bilateral cataract surgery Other: None MRA HEAD FINDINGS Image  quality degraded by motion. Right vertebral dominant supplying the basilar and widely patent. Left vertebral artery not visualized and may be hypoplastic. Posterior cerebral arteries patent bilaterally. Cerebellar arteries not well seen due to motion Internal carotid artery patent bilaterally without stenosis. Anterior and middle cerebral arteries patent. There is artifact obscuring vessel detail. IMPRESSION: 1. Image quality degraded significantly by motion 2. Negative for acute infarct.  No subdural hematoma or mass-effect. 3. MRA degraded by motion. No large vessel occlusion. Left vertebral artery appears hypoplastic. Electronically Signed   By: Franchot Gallo M.D.   On: 05/20/2019 16:26   Ct Abdomen Pelvis W Contrast  Result Date: 05/20/2019 CLINICAL DATA:  Recurrent falls. The patient was found down 05/18/2019. EXAM: CT ABDOMEN AND PELVIS WITH CONTRAST TECHNIQUE: Multidetector CT imaging of the abdomen and pelvis was performed using the standard protocol following bolus administration of intravenous contrast. CONTRAST:  75 mL OMNIPAQUE IOHEXOL 300 MG/ML  SOLN COMPARISON:   None. FINDINGS: Lower chest: Mild basilar atelectasis on the left. Lung bases otherwise clear. No pleural or pericardial effusion. Cardiomegaly. Hepatobiliary: No focal liver abnormality is seen. Status post cholecystectomy. No biliary dilatation. The dome of the liver is just off the superior margin of the scan. Pancreas: Unremarkable. No pancreatic ductal dilatation or surrounding inflammatory changes. Spleen: Normal in size without focal abnormality. Adrenals/Urinary Tract: The adrenal glands appear normal. Stomach/Bowel: The patient has a few small renal cysts bilaterally. There is some scar in the lower pole of the right kidney. Ureters and urinary bladder are unremarkable. Diverticulosis is worst in the sigmoid colon. No diverticulitis. A small hiatal hernia is seen. Vascular/Lymphatic: Aortic atherosclerosis. Aneurysm of the abdominal aorta measures up to 4.1 cm. No hemorrhage. No enlarged abdominal or pelvic lymph nodes. Reproductive: Uterus and bilateral adnexa are unremarkable. Other: Small fat containing supraumbilical hernia is noted. Musculoskeletal: No acute or focal abnormality. Convex right scoliosis and multilevel spondylosis noted. The patient is status post left hip replacement. IMPRESSION: No acute abnormality abdomen or pelvis. 4.1 cm abdominal aortic aneurysm. Recommend followup by ultrasound in 1 year. This recommendation follows ACR consensus guidelines: White Paper of the ACR Incidental Findings Committee II on Vascular Findings. J Am Coll Radiol 2013; 10:789-794. Aortic aneurysm NOS (ICD10-I71.9) Atherosclerosis. Diverticulosis without diverticulitis. Small fat containing supraumbilical hernia. Small hiatal hernia. Electronically Signed   By: Inge Rise M.D.   On: 05/20/2019 13:19   Dg Chest Port 1 View  Result Date: 05/20/2019 CLINICAL DATA:  Altered mental status, found on floor reside bed on Sunday morning, multiple falls, history hypertension, GERD EXAM: PORTABLE CHEST 1 VIEW  COMPARISON:  Portable exam 1038 hours compared to 05/01/2019 FINDINGS: Rotated to the LEFT. Enlargement of cardiac silhouette. Mediastinal contours and pulmonary vascularity normal. Atherosclerotic calcification aorta. Bibasilar opacities favor atelectasis. Remaining lungs clear. No pleural effusion or pneumothorax. Bones demineralized. IMPRESSION: Enlargement of cardiac silhouette with probable bibasilar atelectasis. Electronically Signed   By: Lavonia Dana M.D.   On: 05/20/2019 11:04    Scheduled Meds:  lactulose  20 g Oral Once   lisinopril  10 mg Oral Daily   pantoprazole  40 mg Oral Daily   pravastatin  40 mg Oral Daily   thiamine  100 mg Oral Daily   Continuous Infusions:  sodium chloride       LOS: 1 day    Time spent: 30 minutes    Barton Dubois, MD [facility] Pager [phone]   05/21/2019, 5:21 PM

## 2019-05-21 NOTE — Plan of Care (Signed)
  Problem: Acute Rehab PT Goals(only PT should resolve) Goal: Pt Will Go Supine/Side To Sit Outcome: Progressing Flowsheets (Taken 05/21/2019 1531) Pt will go Supine/Side to Sit: with min guard assist Goal: Patient Will Transfer Sit To/From Stand Outcome: Progressing Flowsheets (Taken 05/21/2019 1531) Patient will transfer sit to/from stand: with min guard assist Goal: Pt Will Transfer Bed To Chair/Chair To Bed Outcome: Progressing Flowsheets (Taken 05/21/2019 1531) Pt will Transfer Bed to Chair/Chair to Bed: min guard assist Goal: Pt Will Ambulate Outcome: Progressing Flowsheets (Taken 05/21/2019 1531) Pt will Ambulate:  100 feet  with min guard assist  with minimal assist  with rolling walker   3:32 PM, 05/21/19 Lonell Grandchild, MPT Physical Therapist with [phone] office [phone] mobile phone

## 2019-05-22 LAB — BASIC METABOLIC PANEL
Anion gap: 10 (ref 5–15)
BUN: 11 mg/dL (ref 8–23)
CO2: 19 mmol/L — ABNORMAL LOW (ref 22–32)
Calcium: 8.5 mg/dL — ABNORMAL LOW (ref 8.9–10.3)
Chloride: 111 mmol/L (ref 98–111)
Creatinine, Ser: 0.54 mg/dL (ref 0.44–1.00)
GFR calc Af Amer: >60 mL/min (ref >60)
GFR calc non Af Amer: >60 mL/min (ref >60)
Glucose, Bld: 88 mg/dL (ref 70–99)
Potassium: 3.7 mmol/L (ref 3.5–5.1)
Sodium: 140 mmol/L (ref 135–145)

## 2019-05-22 LAB — RPR: RPR Ser Ql: NONREACTIVE

## 2019-05-22 LAB — AMMONIA: Ammonia: 21 umol/L (ref 9–35)

## 2019-05-22 MED ORDER — ACETAMINOPHEN 650 MG RE SUPP: 650.0000 mg | Freq: Four times a day (QID) | RECTAL | Status: DC | PRN

## 2019-05-22 MED ORDER — ACETAMINOPHEN 325 MG PO TABS: 650.0000 mg | ORAL_TABLET | Freq: Four times a day (QID) | ORAL | Status: DC | PRN

## 2019-05-22 MED ORDER — FAMOTIDINE IN NACL 20-0.9 MG/50ML-% IV SOLN
20.0000 mg | INTRAVENOUS | Status: DC
  Administered 2019-05-22 – 2019-05-23 (×2): 20 mg via INTRAVENOUS
  Filled 2019-05-22 (×2): qty 50

## 2019-05-22 MED ORDER — METHYLPREDNISOLONE SODIUM SUCC 40 MG IJ SOLR
20.0000 mg | Freq: Two times a day (BID) | INTRAMUSCULAR | Status: AC
  Administered 2019-05-22 (×2): 20 mg via INTRAVENOUS
  Filled 2019-05-22 (×2): qty 1

## 2019-05-22 MED ORDER — FAMOTIDINE IN NACL 20-0.9 MG/50ML-% IV SOLN: 20.0000 mg | Freq: Two times a day (BID) | INTRAVENOUS | Status: DC

## 2019-05-22 MED ORDER — RISPERIDONE 0.5 MG PO TABS
0.2500 mg | ORAL_TABLET | Freq: Two times a day (BID) | ORAL | Status: DC
  Administered 2019-05-23: 0.25 mg via ORAL
  Filled 2019-05-22 (×3): qty 1

## 2019-05-22 MED ORDER — LORAZEPAM 2 MG/ML IJ SOLN
1.0000 mg | Freq: Once | INTRAMUSCULAR | Status: AC
  Administered 2019-05-22: 1 mg via INTRAVENOUS
  Filled 2019-05-22: qty 1

## 2019-05-22 NOTE — TOC Progression Note (Signed)
Transition of Care [facility]) - Progression Note    Patient Details  Name: [name] MRN: [id number] Date of Birth: Nov 09, 1927  Transition of Care [facility]) CM/SW Contact  Ihor Gully, LCSW Phone Number: 05/22/2019, 5:04 PM  Clinical Narrative:    Patient insurance request Peer to peer. Attending provided contact number of [phone], option 5, deadline today at 4. Attending called and received on answer for peer to peer.  LCSW contacted Bernadene Bell, Spoke with Melissa. She indicated that the medical director had issued a denial due to not having peer to peer and that an expedited appeal would have to be placed by calling [phone].  LCSW contact [employer] for appeal of denial and spoke with [facility]. Eulas Post advised that [phone] was the number to call, advised that was the number called and it connected me with him. Eulas Post then called with me and a message was left requesting instruction on completing and expedited request.         Expected Discharge Plan and Services                                                 Social Determinants of Health (SDOH) Interventions    Readmission Risk Interventions No flowsheet data found.

## 2019-05-22 NOTE — Progress Notes (Signed)
PROGRESS NOTE    [name]  ZOX:[id number] DOB: 04-15-1927 DOA: 05/20/2019 PCP: Elfredia NevinsFusco, Lawrence, MD     Brief Narrative:  85 y.o. female with medical history significant for GERD, hypertension, neuropathy, dyslipidemia, and dementia who has been noted to have worsening confusion and disorientation at home.  She cannot remember who she is and where she is at times as well as recognize family members.  There have been no noted behavioral disturbances, but patient has had some weakness to her legs with associated frequent falls.  Her family members check up on her on a regular basis and her niece stays with her throughout the day and her son usually visits in the evenings.  They help manage her medications and state that it is highly unlikely that she would have overdosed on any of her medications.  They deny any upper respiratory symptoms, fevers, chills, or other issues.  The patient does live on her own despite the fact that her family members visit frequently and they are afraid that her dementia may be progressing where she may have some other acute issue and will likely require placement to SNF.  No urinary incontinence or tongue biting or seizure-like activity noted at home.  No note of any toxic ingestion noted.   ED Course: Vital signs are stable and patient is afebrile.  No leukocytosis noted on laboratory data and hemoglobin is 10.2 with platelet count of 90,000.  Creatinine is 1.11 which appears to be at patient's baseline.  Urine analysis negative for UTI, but lactic acid noted to be 2.0 and patient did receive aztreonam, Flagyl, and vancomycin in the emergency department.  CT of the head is negative for any acute abnormalities and CT of the abdomen and pelvis with no acute abnormalities and 4.1 cm AAA noted  Assessment & Plan: 1-Acute encephalopathy -Appears to be secondary to elevated ammonia level; patient found to be dehydrated and with underlying history of dementia also playing a  role. -Minimize the use of PRN sedative agents -Start low-dose Risperdal. -For restlessness will use sitter as much as possible and constant  reorientation. -Follow clinical response and improvement. -Patient mentation is slightly improved today; but is still not at baseline  2-physical deconditioning -Evaluated by physical therapy -Recommended skilled nursing facility for short-term rehabilitation.  3-Abdominal aortic aneurysm (HCC) -No signs of rupture -Continue monitoring  4-Essential hypertension -Stable -Continue holding Lasix -Lisinopril has been discontinued at this moment.  (Given tongue swelling). -follow vital signs.  5-Neuropathy -Resume the use of gabapentin when mentation improved.  6-Dementia without behavioral disturbance (HCC) -Continue supportive care.  7-hyperlipidemia -Continue statins  8-acute kidney injury -Improving/resolved with the use of IV fluids -Will attempt to maintain adequate hydration. -Repeat basic metabolic panel in a.m. to follow electrolytes and renal function.  9-gastroesophageal reflux disease -Continue PPI.  10-tongue swelling -Will give low-dose steroids and Pepcid -Discontinue ACE inhibitors -Follow speech therapy recommendation for safety swallowing.   DVT prophylaxis: SCDs Code Status: Full code Family Communication: Son over the phone. Disposition Plan: Remains inpatient, minimize sedative agents, will use sitter for agitation and reorientation.  Based on physical therapy recommendations will pursued skilled nursing facility for short-term rehab.  Use low-dose Risperdal and follow recommendation by speech therapy.  Consultants:   None  Procedures:   See below for x-ray reports.  Antimicrobials:  Anti-infectives (From admission, onward)   Start     Dose/Rate Route Frequency Ordered Stop   05/21/19 1400  vancomycin (VANCOCIN) IVPB 1000 mg/200 mL premix  Status:  Discontinued     1,000 mg 200 mL/hr over 60 Minutes  Intravenous Every 24 hours 05/20/19 1230 05/20/19 1501   05/20/19 2200  aztreonam (AZACTAM) 1 g in sodium chloride 0.9 % 100 mL IVPB  Status:  Discontinued     1 g 200 mL/hr over 30 Minutes Intravenous Every 8 hours 05/20/19 1228 05/20/19 1501   05/20/19 1300  vancomycin (VANCOCIN) IVPB 1000 mg/200 mL premix  Status:  Discontinued     1,000 mg 200 mL/hr over 60 Minutes Intravenous Every 1 hr x 2 05/20/19 1225 05/20/19 1501   05/20/19 1230  aztreonam (AZACTAM) 2 g in sodium chloride 0.9 % 100 mL IVPB     2 g 200 mL/hr over 30 Minutes Intravenous  Once 05/20/19 1217 05/20/19 1339   05/20/19 1230  metroNIDAZOLE (FLAGYL) IVPB 500 mg     500 mg 100 mL/hr over 60 Minutes Intravenous  Once 05/20/19 1217 05/20/19 1439   05/20/19 1230  vancomycin (VANCOCIN) IVPB 1000 mg/200 mL premix  Status:  Discontinued     1,000 mg 200 mL/hr over 60 Minutes Intravenous  Once 05/20/19 1217 05/20/19 1225       Subjective: No fever, no chest pain, no shortness of breath.  Patient pleasantly confused today, oriented x1 and able to follow commands.  Swelling tongue appreciated on exam with mild difficulty swallowing.  Objective: Vitals:   05/21/19 1832 05/21/19 2056 05/22/19 0400 05/22/19 1345  BP: (!) 168/89 (!) 172/82 135/89 (!) 144/70  Pulse: 100 (!) 111 (!) 105 (!) 101  Resp: 20 20 20 19   Temp: 97.6 F (36.4 C) 97.9 F (36.6 C)  (!) 97.5 F (36.4 C)  TempSrc: Oral Oral  Axillary  SpO2: 98% 97% 97% 90%  Weight:      Height:        Intake/Output Summary (Last 24 hours) at 05/22/2019 1819 Last data filed at 05/22/2019 1700 Gross per 24 hour  Intake 838.02 ml  Output 550 ml  Net 288.02 ml   Filed Weights   05/20/19 1006  Weight: 78.9 kg    Examination: General exam: Alert, awake, oriented x 1; still confused but able to follow commands and easily reoriented.  Swollen tone and mild difficulty swallowing appreciated during today's examination.  Continues to be weak, deconditioned, off balance  and not at her baseline per prior to admission reports. Respiratory system: Clear to auscultation. Respiratory effort normal. Cardiovascular system:RRR. No murmurs, rubs, gallops. Gastrointestinal system: Abdomen is nondistended, soft and nontender. No organomegaly or masses felt. Normal bowel sounds heard. Central nervous system: Alert and oriented. No focal neurological deficits. Extremities: No C/C/E, +pedal pulses Skin: No rashes, lesions or ulcers Psychiatry: Judgement and insight appear impaired secondary to underlying dementia and current encephalopathic abnormalities.    Data Reviewed: I have personally reviewed following labs and imaging studies  CBC: Recent Labs  Lab 05/20/19 1028 05/21/19 0522  WBC 5.3 4.6  NEUTROABS 2.5  --   HGB 10.2* 8.4*  HCT 33.0* 27.9*  MCV 97.3 99.6  PLT 90* 69*   Basic Metabolic Panel: Recent Labs  Lab 05/20/19 1028 05/21/19 0522 05/22/19 0813  NA 140 143 140  K 3.9 3.6 3.7  CL 111 116* 111  CO2 21* 23 19*  GLUCOSE 114* 83 88  BUN 14 13 11   CREATININE 1.11* 0.69 0.54  CALCIUM 8.9 8.2* 8.5*  MG  --  1.8  --    GFR: Estimated Creatinine Clearance: 48.8 mL/min (by C-G formula based  on SCr of 0.54 mg/dL).   Liver Function Tests: Recent Labs  Lab 05/20/19 1028  AST 39  ALT 23  ALKPHOS 112  BILITOT 1.8*  PROT 6.4*  ALBUMIN 2.5*   Recent Labs  Lab 05/20/19 1028  LIPASE 58*   Recent Labs  Lab 05/21/19 0522 05/22/19 1145  AMMONIA 83* 21   Thyroid Function Tests: Recent Labs    05/20/19 1028  TSH 2.304   Anemia Panel: Recent Labs    05/20/19 1028  VITAMINB12 673   Urine analysis:    Component Value Date/Time   COLORURINE YELLOW 05/20/2019 1028   APPEARANCEUR HAZY (A) 05/20/2019 1028   LABSPEC 1.018 05/20/2019 1028   PHURINE 5.0 05/20/2019 1028   GLUCOSEU NEGATIVE 05/20/2019 1028   HGBUR NEGATIVE 05/20/2019 1028   [place] 05/20/2019 1028   KETONESUR NEGATIVE 05/20/2019 1028   PROTEINUR  NEGATIVE 05/20/2019 1028   UROBILINOGEN 2.0 (H) 07/19/2015 1045   NITRITE NEGATIVE 05/20/2019 1028   LEUKOCYTESUR TRACE (A) 05/20/2019 1028    Recent Results (from the past 240 hour(s))  Culture, blood (Routine X 2) w Reflex to ID Panel     Status: None (Preliminary result)   Collection Time: 05/20/19 10:54 AM   Specimen: BLOOD LEFT ARM  Result Value Ref Range Status   Specimen Description BLOOD LEFT ARM  Final   Special Requests   Final    BOTTLES DRAWN AEROBIC AND ANAEROBIC Blood Culture results may not be optimal due to an inadequate volume of blood received in culture bottles   Culture   Final    NO GROWTH 2 DAYS Performed at [facility], [address] [place], [place] [address]    Report Status PENDING  Incomplete  SARS Coronavirus 2 (CEPHEID - Performed in [place] hospital lab), Hosp Order     Status: None   Collection Time: 05/20/19  1:58 PM   Specimen: Nasopharyngeal Swab  Result Value Ref Range Status   SARS Coronavirus 2 NEGATIVE NEGATIVE Final    Comment: (NOTE) If result is NEGATIVE SARS-CoV-2 target nucleic acids are NOT DETECTED. The SARS-CoV-2 RNA is generally detectable in upper and lower  respiratory specimens during the acute phase of infection. The lowest  concentration of SARS-CoV-2 viral copies this assay can detect is 250  copies / mL. A negative result does not preclude SARS-CoV-2 infection  and should not be used as the sole basis for treatment or other  patient management decisions.  A negative result may occur with  improper specimen collection / handling, submission of specimen other  than nasopharyngeal swab, presence of viral mutation(s) within the  areas targeted by this assay, and inadequate number of viral copies  (<250 copies / mL). A negative result must be combined with clinical  observations, patient history, and epidemiological information. If result is POSITIVE SARS-CoV-2 target nucleic acids are DETECTED. The SARS-CoV-2 RNA is  generally detectable in upper and lower  respiratory specimens dur ing the acute phase of infection.  Positive  results are indicative of active infection with SARS-CoV-2.  Clinical  correlation with patient history and other diagnostic information is  necessary to determine patient infection status.  Positive results do  not rule out bacterial infection or co-infection with other viruses. If result is PRESUMPTIVE POSTIVE SARS-CoV-2 nucleic acids MAY BE PRESENT.   A presumptive positive result was obtained on the submitted specimen  and confirmed on repeat testing.  While 2019 novel coronavirus  (SARS-CoV-2) nucleic acids may be present in the  submitted sample  additional confirmatory testing may be necessary for epidemiological  and / or clinical management purposes  to differentiate between  SARS-CoV-2 and other Sarbecovirus currently known to infect humans.  If clinically indicated additional testing with an alternate test  methodology [phone]) is advised. The SARS-CoV-2 RNA is generally  detectable in upper and lower respiratory sp ecimens during the acute  phase of infection. The expected result is Negative. Fact Sheet for Patients:  BoilerBrush.com.cy Fact Sheet for Healthcare Providers: https://pope.com/ This test is not yet approved or cleared by the [place] FDA and has been authorized for detection and/or diagnosis of SARS-CoV-2 by FDA under an Emergency Use Authorization (EUA).  This EUA will remain in effect (meaning this test can be used) for the duration of the COVID-19 declaration under Section 564(b)(1) of the Act, 21 U.S.C. section 360bbb-3(b)(1), unless the authorization is terminated or revoked sooner. Performed at [facility], [address], [place], [place] [address]      Radiology Studies: No results found.  Scheduled Meds: . methylPREDNISolone (SOLU-MEDROL) injection  20 mg Intravenous Q12H  .  risperiDONE  0.25 mg Oral BID  . thiamine  100 mg Oral Daily   Continuous Infusions: . [START ON 05/23/2019] famotidine (PEPCID) IV 20 mg (05/22/19 1426)     LOS: 2 days    Time spent: 30 minutes    Vassie Loll, MD [facility] Pager [phone]   05/22/2019, 6:19 PM

## 2019-05-22 NOTE — Progress Notes (Signed)
Patient has been confused and disoriented for entire shift.  Patient has 1:1 sitter and has continued to get oob without assistance and pull at lines and tubes.  Patient is no easily reoriented, but is also hard of hearing.  Will continue to monitor patient.

## 2019-05-22 NOTE — Evaluation (Signed)
Clinical/Bedside Swallow Evaluation Patient Details  Name: [name] MRN: [id number] Date of Birth: Aug 14, 1927  Today's Date: 05/22/2019 Time: SLP Start Time (ACUTE ONLY): 1240 SLP Stop Time (ACUTE ONLY): 1305 SLP Time Calculation (min) (ACUTE ONLY): 25 min  Past Medical History:  Past Medical History:  Diagnosis Date  . GERD (gastroesophageal reflux disease)   . Hyperlipidemia   . Hypertension   . Neuromuscular disorder [facility](HCC)    Past Surgical History:  Past Surgical History:  Procedure Laterality Date  . ABDOMINAL AORTIC ANEURYSM REPAIR    . BREAST BIOPSY    . CHOLECYSTECTOMY    . INGUINAL HERNIA REPAIR    . TOTAL HIP ARTHROPLASTY     HPI:  [name] is a 85 y.o. female with medical history significant for GERD, hypertension, neuropathy, dyslipidemia, and dementia who has been noted to have worsening confusion and disorientation at home.  She cannot remember who she is and where she is at times as well as recognize family members.  There have been no noted behavioral disturbances, but patient has had some weakness to her legs with associated frequent falls.  Her family members check up on her on a regular basis and her niece stays with her throughout the day and her son usually visits in the evenings.  They help manage her medications and state that it is highly unlikely that she would have overdosed on any of her medications.  They deny any upper respiratory symptoms, fevers, chills, or other issues.  The patient does live on her own despite the fact that her family members visit frequently and they are afraid that her dementia may be progressing where she may have some other acute issue and will likely require placement to SNF.  No urinary incontinence or tongue biting or seizure-like activity noted at home.  No note of any toxic ingestion noted. Vital signs are stable and patient is afebrile.  No leukocytosis noted on laboratory data and hemoglobin is 10.2 with platelet count  of 90,000.  Creatinine is 1.11 which appears to be at patient's baseline.  Urine analysis negative for UTI, but lactic acid noted to be 2.0 and patient did receive aztreonam, Flagyl, and vancomycin in the emergency department.  CT of the head is negative for any acute abnormalities and CT of the abdomen and pelvis with no acute abnormalities and 4.1 cm AAA noted. BSE requested.   Assessment / Plan / Recommendation Clinical Impression  Clinical swallow evaluation completed at bedside. Pt required cueing due to lethargy and confusion, however she was able to maintain adequate alertness for assessment. Nursing staff reported that Pt was not alert enough this AM for breakfast and was orally holding liquids. Pt presents with mild generalized oral weakness and bruising on anterior portion of tongue. She was unable to follow commands for oral motor evaluation. Pt unable to cough on command or swallow upon request. Pt assessed with thins via straw, puree, and mech soft textures which resulted in prolonged oral transit, variable labial spillage due to fluctuating alertness, audible swallows, and multiple swallows. Pt with occasional delayed cough following solid textures; min oral residuals with solids. Pt is at risk for aspiration due to cognitive deficits and fluctuating alertness. Recommend D2/chopped with thin liquids with 100% feeder assist when Pt is alert and upright; po medications whole or crushed as able in puree. Please assist with oral care. SLP will follow.   SLP Visit Diagnosis: Dysphagia, unspecified (R13.10)    Aspiration Risk  Mild aspiration  risk;Moderate aspiration risk    Diet Recommendation Dysphagia 2 (Fine chop);Thin liquid   Liquid Administration via: Cup;Straw Medication Administration: Whole meds with puree Supervision: Staff to assist with self feeding;Full supervision/cueing for compensatory strategies Compensations: Slow rate;Small sips/bites(ensure oral cavity is clear after  meals) Postural Changes: Seated upright at 90 degrees;Remain upright for at least 30 minutes after po intake    Other  Recommendations Oral Care Recommendations: Oral care BID;Staff/trained caregiver to provide oral care Other Recommendations: Clarify dietary restrictions   Follow up Recommendations 24 hour supervision/assistance;Skilled Nursing facility      Frequency and Duration min 2x/week  1 week       Prognosis Prognosis for Safe Diet Advancement: Fair Barriers to Reach Goals: Cognitive deficits;Behavior      Swallow Study   General Date of Onset: 05/20/19 HPI: [name] is a 85 y.o. female with medical history significant for GERD, hypertension, neuropathy, dyslipidemia, and dementia who has been noted to have worsening confusion and disorientation at home.  She cannot remember who she is and where she is at times as well as recognize family members.  There have been no noted behavioral disturbances, but patient has had some weakness to her legs with associated frequent falls.  Her family members check up on her on a regular basis and her niece stays with her throughout the day and her son usually visits in the evenings.  They help manage her medications and state that it is highly unlikely that she would have overdosed on any of her medications.  They deny any upper respiratory symptoms, fevers, chills, or other issues.  The patient does live on her own despite the fact that her family members visit frequently and they are afraid that her dementia may be progressing where she may have some other acute issue and will likely require placement to SNF.  No urinary incontinence or tongue biting or seizure-like activity noted at home.  No note of any toxic ingestion noted. Vital signs are stable and patient is afebrile.  No leukocytosis noted on laboratory data and hemoglobin is 10.2 with platelet count of 90,000.  Creatinine is 1.11 which appears to be at patient's baseline.  Urine  analysis negative for UTI, but lactic acid noted to be 2.0 and patient did receive aztreonam, Flagyl, and vancomycin in the emergency department.  CT of the head is negative for any acute abnormalities and CT of the abdomen and pelvis with no acute abnormalities and 4.1 cm AAA noted. BSE requested. Type of Study: Bedside Swallow Evaluation Previous Swallow Assessment: BSE 03/23/17  Diet Prior to this Study: Regular;Thin liquids Temperature Spikes Noted: No Respiratory Status: Room air History of Recent Intubation: No Behavior/Cognition: Alert;Cooperative;Requires cueing Oral Cavity Assessment: Within Functional Limits(Pt with bruising on anterior R portion of tongue) Oral Care Completed by SLP: Recent completion by staff Oral Cavity - Dentition: Adequate natural dentition Vision: Impaired for self-feeding Self-Feeding Abilities: Total assist Patient Positioning: Upright in bed Baseline Vocal Quality: Normal Volitional Cough: Cognitively unable to elicit Volitional Swallow: Unable to elicit    Oral/Motor/Sensory Function Overall Oral Motor/Sensory Function: Generalized oral weakness   Ice Chips Ice chips: Not tested   Thin Liquid Thin Liquid: Impaired Presentation: Straw;Spoon Oral Phase Impairments: Reduced labial seal Oral Phase Functional Implications: Right anterior spillage Pharyngeal  Phase Impairments: Throat Clearing - Delayed;Multiple swallows Other Comments: (audible swallows at times)    Nectar Thick Nectar Thick Liquid: Not tested   Honey Thick Honey Thick Liquid: Not tested  Puree Puree: Within functional limits Presentation: Spoon   Solid     Solid: Impaired Presentation: Spoon Oral Phase Functional Implications: Prolonged oral transit;Oral residue Pharyngeal Phase Impairments: Cough - Delayed     Thank you,  Genene Churn, [place]  [name] 05/22/2019,1:18 PM

## 2019-05-23 MED ORDER — RISPERIDONE 0.5 MG PO TABS
0.2500 mg | ORAL_TABLET | Freq: Every day | ORAL | Status: DC
  Administered 2019-05-24 – 2019-05-26 (×3): 0.25 mg via ORAL
  Filled 2019-05-23 (×3): qty 1

## 2019-05-23 MED ORDER — FAMOTIDINE 20 MG PO TABS
20.0000 mg | ORAL_TABLET | Freq: Every day | ORAL | Status: DC
  Administered 2019-05-24 – 2019-05-26 (×3): 20 mg via ORAL
  Filled 2019-05-23 (×3): qty 1

## 2019-05-23 NOTE — Care Management Important Message (Signed)
Important Message  Patient Details  Name: Cammy Sanjurjo Ruffner MRN: [id number] Date of Birth: 07-31-1927   Medicare Important Message Given:  Yes     Florabel Faulks, Chauncey [place], RN 05/23/2019, 1:05 PM

## 2019-05-23 NOTE — Care Management (Addendum)
Call to [facility] to start expedited appeal process [phone]). PT, SLP and progress notes faxed to [phone].  Informed that appeal process can take up to 72 hours.    ADDENDUM 1200 noon: Received VM from [facility] [phone]), acknowledging receiving fax for appeal. They reiterated that they have 72 hours to process appeals (this includes weekends).   ADDENDUM: 1430: Faxed today's PT note to appeals.

## 2019-05-23 NOTE — Progress Notes (Signed)
PROGRESS NOTE    Kellie NearingFrances I Young  YNW:[id number] DOB: 10-Mar-1927 DOA: 05/20/2019 PCP: Elfredia NevinsFusco, Lawrence, MD     Brief Narrative:  85 y.o. female with medical history significant for GERD, hypertension, neuropathy, dyslipidemia, and dementia who has been noted to have worsening confusion and disorientation at home.  She cannot remember who she is and where she is at times as well as recognize family members.  There have been no noted behavioral disturbances, but patient has had some weakness to her legs with associated frequent falls.  Her family members check up on her on a regular basis and her niece stays with her throughout the day and her son usually visits in the evenings.  They help manage her medications and state that it is highly unlikely that she would have overdosed on any of her medications.  They deny any upper respiratory symptoms, fevers, chills, or other issues.  The patient does live on her own despite the fact that her family members visit frequently and they are afraid that her dementia may be progressing where she may have some other acute issue and will likely require placement to SNF.  No urinary incontinence or tongue biting or seizure-like activity noted at home.  No note of any toxic ingestion noted.   ED Course: Vital signs are stable and patient is afebrile.  No leukocytosis noted on laboratory data and hemoglobin is 10.2 with platelet count of 90,000.  Creatinine is 1.11 which appears to be at patient's baseline.  Urine analysis negative for UTI, but lactic acid noted to be 2.0 and patient did receive aztreonam, Flagyl, and vancomycin in the emergency department.  CT of the head is negative for any acute abnormalities and CT of the abdomen and pelvis with no acute abnormalities and 4.1 cm AAA noted  Assessment & Plan: 1-Acute encephalopathy -Appears to be secondary to elevated ammonia level; patient found to be dehydrated and underlying history of dementia also playing a role.  -Minimize the use of PRN sedative agents -high risk for sundowning. -continue low-dose Risperdal. -For restlessness will use sitter as much as possible and constant  reorientation. -Follow clinical response and improvement. -Patient mentation continue slowly improving; but is still not at baseline as per son reports.   2-physical deconditioning -Evaluated by physical therapy -Recommended skilled nursing facility for short-term rehabilitation. -patient participating more with PT  3-Abdominal aortic aneurysm (HCC) -No signs of rupture -Continue outpatient monitoring  4-Essential hypertension -Stable -Continue holding Lasix -Lisinopril has been discontinued at this moment.  (Given tongue swelling and concerns for mild angiodema). -follow vital signs.  5-Neuropathy -Resume the use of gabapentin when mentation improved.  6-Dementia without behavioral disturbance (HCC) -Continue supportive care. -continue low dose risperdal -Prior to this admission living by herself with family intermittent assistance.  7-hyperlipidemia -resume statins at discharge  8-acute kidney injury -Improved/resolved with the use of IV fluids -Will attempt to maintain adequate oral hydration.  9-gastroesophageal reflux disease -Continue Pepcid.  10-tongue swelling -Excellent response to Pepcid and a steroids -ACE inhibitors discontinued -Appreciate evaluation by speech therapy; continue dysphagia 2 diet with thin liquids.   DVT prophylaxis: SCDs Code Status: Full code Family Communication: Son at bedside. Disposition Plan: Remains inpatient, continue minimizing sedative agents,  Based on physical therapy recommendations will pursued skilled nursing facility for short-term rehab.  Continue to use low-dose Risperdal and follow recommendation by speech therapy.  Consultants:   None  Procedures:   See below for x-ray reports.  Antimicrobials:  Anti-infectives (From admission,  onward)   Start      Dose/Rate Route Frequency Ordered Stop   05/21/19 1400  vancomycin (VANCOCIN) IVPB 1000 mg/200 mL premix  Status:  Discontinued     1,000 mg 200 mL/hr over 60 Minutes Intravenous Every 24 hours 05/20/19 1230 05/20/19 1501   05/20/19 2200  aztreonam (AZACTAM) 1 g in sodium chloride 0.9 % 100 mL IVPB  Status:  Discontinued     1 g 200 mL/hr over 30 Minutes Intravenous Every 8 hours 05/20/19 1228 05/20/19 1501   05/20/19 1300  vancomycin (VANCOCIN) IVPB 1000 mg/200 mL premix  Status:  Discontinued     1,000 mg 200 mL/hr over 60 Minutes Intravenous Every 1 hr x 2 05/20/19 1225 05/20/19 1501   05/20/19 1230  aztreonam (AZACTAM) 2 g in sodium chloride 0.9 % 100 mL IVPB     2 g 200 mL/hr over 30 Minutes Intravenous  Once 05/20/19 1217 05/20/19 1339   05/20/19 1230  metroNIDAZOLE (FLAGYL) IVPB 500 mg     500 mg 100 mL/hr over 60 Minutes Intravenous  Once 05/20/19 1217 05/20/19 1439   05/20/19 1230  vancomycin (VANCOCIN) IVPB 1000 mg/200 mL premix  Status:  Discontinued     1,000 mg 200 mL/hr over 60 Minutes Intravenous  Once 05/20/19 1217 05/20/19 1225       Subjective: No agitation, easily reoriented, no chest pain, no nausea, no vomiting, no shortness of breath.  Patient tolerating dysphagia 2 diet and thin liquids, significant improvement in tongue swelling.  No nausea vomiting.  Participating more with physical therapy.  Objective: Vitals:   05/22/19 0400 05/22/19 1345 05/22/19 2128 05/23/19 0639  BP: 135/89 (!) 144/70 (!) 166/80 (!) 160/80  Pulse: (!) 105 (!) 101 (!) 114 (!) 110  Resp: 20 19 17 17   Temp:  (!) 97.5 F (36.4 C) 97.9 F (36.6 C) 98.1 F (36.7 C)  TempSrc:  Axillary    SpO2: 97% 90% 97% 96%  Weight:      Height:        Intake/Output Summary (Last 24 hours) at 05/23/2019 1433 Last data filed at 05/22/2019 1700 Gross per 24 hour  Intake 120 ml  Output -  Net 120 ml   Filed Weights   05/20/19 1006  Weight: 78.9 kg    Examination: General exam: Alert,  awake, oriented x 1; still not back to baseline according to son at bedside.  Patient had a better night, has been easily reoriented and has started to participate with physical therapy.  No chest pain, no shortness of breath, no nausea, no vomiting.  Tongue swelling significantly improved and tolerating dysphagia 2 diet with thin liquids. Respiratory system: Clear to auscultation. Respiratory effort normal. Cardiovascular system:RRR. No murmurs, rubs, gallops. Gastrointestinal system: Abdomen is nondistended, soft and nontender. No organomegaly or masses felt. Normal bowel sounds heard. Central nervous system: Alert and oriented. No focal neurological deficits. Extremities: No C/C/E, +pedal pulses Skin: No rashes, lesions or ulcers Psychiatry: Judgement and insight appear impaired secondary to underlying dementia. Stable mood currently.  Data Reviewed: I have personally reviewed following labs and imaging studies  CBC: Recent Labs  Lab 05/20/19 1028 05/21/19 0522  WBC 5.3 4.6  NEUTROABS 2.5  --   HGB 10.2* 8.4*  HCT 33.0* 27.9*  MCV 97.3 99.6  PLT 90* 69*   Basic Metabolic Panel: Recent Labs  Lab 05/20/19 1028 05/21/19 0522 05/22/19 0813  NA 140 143 140  K 3.9 3.6 3.7  CL 111 116* 111  CO2 21* 23 19*  GLUCOSE 114* 83 88  BUN 14 13 11   CREATININE 1.11* 0.69 0.54  CALCIUM 8.9 8.2* 8.5*  MG  --  1.8  --    GFR: Estimated Creatinine Clearance: 48.8 mL/min (by C-G formula based on SCr of 0.54 mg/dL).   Liver Function Tests: Recent Labs  Lab 05/20/19 1028  AST 39  ALT 23  ALKPHOS 112  BILITOT 1.8*  PROT 6.4*  ALBUMIN 2.5*   Recent Labs  Lab 05/20/19 1028  LIPASE 58*   Recent Labs  Lab 05/21/19 0522 05/22/19 1145  AMMONIA 83* 21   Urine analysis:    Component Value Date/Time   COLORURINE YELLOW 05/20/2019 1028   APPEARANCEUR HAZY (A) 05/20/2019 1028   LABSPEC 1.018 05/20/2019 1028   PHURINE 5.0 05/20/2019 1028   GLUCOSEU NEGATIVE 05/20/2019 1028    HGBUR NEGATIVE 05/20/2019 1028   BILIRUBINUR NEGATIVE 05/20/2019 1028   KETONESUR NEGATIVE 05/20/2019 1028   PROTEINUR NEGATIVE 05/20/2019 1028   UROBILINOGEN 2.0 (H) 07/19/2015 1045   NITRITE NEGATIVE 05/20/2019 1028   LEUKOCYTESUR TRACE (A) 05/20/2019 1028    Recent Results (from the past 240 hour(s))  Culture, blood (Routine X 2) w Reflex to ID Panel     Status: None (Preliminary result)   Collection Time: 05/20/19 10:54 AM   Specimen: BLOOD LEFT ARM  Result Value Ref Range Status   Specimen Description BLOOD LEFT ARM  Final   Special Requests   Final    BOTTLES DRAWN AEROBIC AND ANAEROBIC Blood Culture results may not be optimal due to an inadequate volume of blood received in culture bottles   Culture   Final    NO GROWTH 3 DAYS Performed at [facility], [address], [place], [place] [address]    Report Status PENDING  Incomplete  SARS Coronavirus 2 (CEPHEID - Performed in [facility] hospital lab), Hosp Order     Status: None   Collection Time: 05/20/19  1:58 PM   Specimen: Nasopharyngeal Swab  Result Value Ref Range Status   SARS Coronavirus 2 NEGATIVE NEGATIVE Final    Comment: (NOTE) If result is NEGATIVE SARS-CoV-2 target nucleic acids are NOT DETECTED. The SARS-CoV-2 RNA is generally detectable in upper and lower  respiratory specimens during the acute phase of infection. The lowest  concentration of SARS-CoV-2 viral copies this assay can detect is 250  copies / mL. A negative result does not preclude SARS-CoV-2 infection  and should not be used as the sole basis for treatment or other  patient management decisions.  A negative result may occur with  improper specimen collection / handling, submission of specimen other  than nasopharyngeal swab, presence of viral mutation(s) within the  areas targeted by this assay, and inadequate number of viral copies  (<250 copies / mL). A negative result must be combined with clinical  observations, patient history, and  epidemiological information. If result is POSITIVE SARS-CoV-2 target nucleic acids are DETECTED. The SARS-CoV-2 RNA is generally detectable in upper and lower  respiratory specimens dur ing the acute phase of infection.  Positive  results are indicative of active infection with SARS-CoV-2.  Clinical  correlation with patient history and other diagnostic information is  necessary to determine patient infection status.  Positive results do  not rule out bacterial infection or co-infection with other viruses. If result is PRESUMPTIVE POSTIVE SARS-CoV-2 nucleic acids MAY BE PRESENT.   A presumptive positive result was obtained on the submitted specimen  and confirmed on  repeat testing.  While 2019 novel coronavirus  (SARS-CoV-2) nucleic acids may be present in the submitted sample  additional confirmatory testing may be necessary for epidemiological  and / or clinical management purposes  to differentiate between  SARS-CoV-2 and other Sarbecovirus currently known to infect humans.  If clinically indicated additional testing with an alternate test  methodology [phone]([id number]) is advised. The SARS-CoV-2 RNA is generally  detectable in upper and lower respiratory sp ecimens during the acute  phase of infection. The expected result is Negative. Fact Sheet for Patients:  BoilerBrush.com.cyhttps://www.fda.gov/media/136312/download Fact Sheet for Healthcare Providers: https://pope.com/https://www.fda.gov/media/136313/download This test is not yet approved or cleared by the [place] FDA and has been authorized for detection and/or diagnosis of SARS-CoV-2 by FDA under an Emergency Use Authorization (EUA).  This EUA will remain in effect (meaning this test can be used) for the duration of the COVID-19 declaration under Section 564(b)(1) of the Act, 21 U.S.C. section 360bbb-3(b)(1), unless the authorization is terminated or revoked sooner. Performed at [facility], [address] [place], [place] [address]      Radiology  Studies: No results found.  Scheduled Meds: . famotidine  20 mg Oral Daily  . [START ON 05/24/2019] risperiDONE  0.25 mg Oral Daily  . thiamine  100 mg Oral Daily   Continuous Infusions: . famotidine (PEPCID) IV 20 mg (05/23/19 1244)     LOS: 3 days    Time spent: 30 minutes    Vassie Lollarlos Mishayla Sliwinski, MD [facility] Pager [phone]   05/23/2019, 2:33 PM

## 2019-05-23 NOTE — Progress Notes (Signed)
Physical Therapy Treatment Patient Details Name: [name] MRN: [id number] DOB: [birth year] Today's Date: 05/23/2019    History of Present Illness [name] is a 85 y.o. female with medical history significant for GERD, hypertension, neuropathy, dyslipidemia, and dementia who has been noted to have worsening confusion and disorientation at home.  She cannot remember who she is and where she is at times as well as recognize family members.  There have been no noted behavioral disturbances, but patient has had some weakness to her legs with associated frequent falls.  Her family members check up on her on a regular basis and her niece stays with her throughout the day and her son usually visits in the evenings.  They help manage her medications and state that it is highly unlikely that she would have overdosed on any of her medications.  They deny any upper respiratory symptoms, fevers, chills, or other issues.  The patient does live on her own despite the fact that her family members visit frequently and they are afraid that her dementia may be progressing where she may have some other acute issue and will likely require placement to SNF.  No urinary incontinence or tongue biting or seizure-like activity noted at home.  No note of any toxic ingestion noted.    PT Comments    Patient presents slightly lethargic and able to participate with therapy with verbal/tactile cueing.  Patient demonstrates slow labored movement for sitting up at bedside, sit to stands and transfers, limited to a few steps at bedside due to BLE weakness and fatigue.  Patient tolerated sitting up in chair with her son present at bedside after therapy - RN aware.  Patient will benefit from continued physical therapy in hospital and recommended venue below to increase strength, balance, endurance for safe ADLs and gait.    Follow Up Recommendations  SNF     Equipment Recommendations  None recommended by PT     Recommendations for Other Services       Precautions / Restrictions Precautions Precautions: Fall Restrictions Weight Bearing Restrictions: No    Mobility  Bed Mobility Overal bed mobility: Needs Assistance Bed Mobility: Supine to Sit     Supine to sit: Mod assist     General bed mobility comments: slow labored movement, requires verbal/tactile cueing  Transfers Overall transfer level: Needs assistance Equipment used: Rolling walker (2 wheeled) Transfers: Sit to/from [name] Sit to Stand: Min assist Stand pivot transfers: Min assist;Mod assist       General transfer comment: slow labored movement and much verbal/tactile cueing for proper hand placement  Ambulation/Gait Ambulation/Gait assistance: Mod assist Gait Distance (Feet): 5 Feet Assistive device: Rolling walker (2 wheeled) Gait Pattern/deviations: Decreased step length - right;Decreased step length - left;Decreased stride length Gait velocity: slow   General Gait Details: limited to 5-6 slow labored unsteady side steps at bedside due to weakness   Stairs             Wheelchair Mobility    Modified Rankin (Stroke Patients Only)       Balance Overall balance assessment: Needs assistance Sitting-balance support: Feet supported;No upper extremity supported Sitting balance-Leahy Scale: Fair     Standing balance support: Bilateral upper extremity supported;During functional activity Standing balance-Leahy Scale: Fair Standing balance comment: fair using RW                            Cognition Arousal/Alertness: Awake/alert Behavior  During Therapy: Restless;Flat affect Overall Cognitive Status: Impaired/Different from baseline Area of Impairment: Orientation;Following commands;Safety/judgement                 Orientation Level: Person     Following Commands: Follows one step commands inconsistently;Follows one step commands with increased  time Safety/Judgement: Decreased awareness of safety     General Comments: Patient's son present during treatment and states patient usually talkative and behaves appropriately      Exercises      General Comments        Pertinent Vitals/Pain Pain Assessment: Faces Faces Pain Scale: No hurt    Home Living                      Prior Function            PT Goals (current goals can now be found in the care plan section) Acute Rehab PT Goals Patient Stated Goal: not stated PT Goal Formulation: With patient/family Time For Goal Achievement: 06/04/19 Potential to Achieve Goals: Good Progress towards PT goals: Progressing toward goals    Frequency    Min 3X/week      PT Plan Current plan remains appropriate    Co-evaluation              AM-PAC PT "6 Clicks" Mobility   Outcome Measure  Help needed turning from your back to your side while in a flat bed without using bedrails?: A Little Help needed moving from lying on your back to sitting on the side of a flat bed without using bedrails?: A Lot Help needed moving to and from a bed to a chair (including a wheelchair)?: A Lot Help needed standing up from a chair using your arms (e.g., wheelchair or bedside chair)?: A Lot Help needed to walk in hospital room?: A Lot Help needed climbing 3-5 steps with a railing? : A Lot 6 Click Score: 13    End of Session Equipment Utilized During Treatment: Gait belt Activity Tolerance: Patient tolerated treatment well;Patient limited by fatigue Patient left: in chair;with chair alarm set;with call bell/phone within reach;with family/visitor present Nurse Communication: Mobility status PT Visit Diagnosis: Unsteadiness on feet (R26.81);Other abnormalities of gait and mobility (R26.89);Muscle weakness (generalized) (M62.81)     Time: 1010-1038 PT Time Calculation (min) (ACUTE ONLY): 28 min  Charges:  $Therapeutic Activity: 23-37 mins                     12:23  PM, 05/23/19 [name], MPT Physical Therapist with [facility] [phone] office [phone] mobile phone

## 2019-05-23 NOTE — Progress Notes (Signed)
Patient transferred to room 315 for closer monitoring at nursing station.

## 2019-05-23 NOTE — Care Management (Addendum)
Appeal process complete, patient has been approved for SNF. [facility] can take patient Saturday. Attending, bedside RN and son updated.

## 2019-05-24 DIAGNOSIS — R220 Localized swelling, mass and lump, head: Secondary | ICD-10-CM

## 2019-05-24 DIAGNOSIS — R1312 Dysphagia, oropharyngeal phase: Secondary | ICD-10-CM

## 2019-05-24 DIAGNOSIS — K219 Gastro-esophageal reflux disease without esophagitis: Secondary | ICD-10-CM

## 2019-05-24 DIAGNOSIS — R4182 Altered mental status, unspecified: Secondary | ICD-10-CM

## 2019-05-24 DIAGNOSIS — F0391 Unspecified dementia with behavioral disturbance: Secondary | ICD-10-CM

## 2019-05-24 MED ORDER — RISPERIDONE 0.25 MG PO TABS: 0.2500 mg | ORAL_TABLET | Freq: Every day | ORAL | Status: DC

## 2019-05-24 MED ORDER — METHYLPHENIDATE HCL 5 MG PO TABS
2.5000 mg | ORAL_TABLET | Freq: Every day | ORAL | Status: DC
  Administered 2019-05-25 – 2019-05-26 (×2): 2.5 mg via ORAL
  Filled 2019-05-24 (×2): qty 1

## 2019-05-24 MED ORDER — AMLODIPINE BESYLATE 5 MG PO TABS: 5.0000 mg | ORAL_TABLET | Freq: Every day | ORAL | Status: DC

## 2019-05-24 MED ORDER — FUROSEMIDE 20 MG PO TABS: 20.0000 mg | ORAL_TABLET | Freq: Every day | ORAL | Status: DC | PRN

## 2019-05-24 MED ORDER — PANTOPRAZOLE SODIUM 40 MG PO TBEC: 40.0000 mg | DELAYED_RELEASE_TABLET | Freq: Every day | ORAL | Status: DC

## 2019-05-24 MED ORDER — METHYLPHENIDATE HCL 5 MG PO TABS: 2.5000 mg | ORAL_TABLET | Freq: Every day | ORAL | Status: DC

## 2019-05-24 MED ORDER — FAMOTIDINE 20 MG PO TABS: 20.0000 mg | ORAL_TABLET | Freq: Every day | ORAL | Status: DC

## 2019-05-24 NOTE — Progress Notes (Signed)
Patient output for shift 250 mL. Bladder scan revealed >265mL. Patient states "I have to pee" but will not void. Explained purpose and placement of external catheter, does not understand. Dr. Dyann Kief made aware, will continue to monitor.

## 2019-05-24 NOTE — Progress Notes (Signed)
Patient has >200 ml in bladder per bladder scan and hasn't voided so far this shift.  RN paged C. Bodenheimer, NP to request order for I&O cath, awaiting response.  P.J. Roopa Graver,RN

## 2019-05-24 NOTE — Discharge Summary (Addendum)
Physician Discharge Summary  Kellie NearingFrances I Young XLK:[id number] DOB: 07/10/1927 DOA: 05/20/2019  PCP: Elfredia NevinsFusco, Lawrence, MD  Admit date: 05/20/2019 Discharge date: 05/26/2019  Time spent: 35 minutes  Recommendations for Outpatient Follow-up:  1. Check BMET in 1 week to follow electrolytes and renal function 2. Repeat CBC in 1 week to follow Hgb trend    Discharge Diagnoses:  Principal Problem:   Acute encephalopathy Active Problems:   Abdominal aortic aneurysm (HCC)   Essential hypertension   Neuropathy   Dementia with behavioral disturbance (HCC)   Altered mental status   Gastroesophageal reflux disease   Oropharyngeal dysphagia   Tongue swelling   Discharge Condition: stable and improved. Discharge to SNF for further care and rehabilitation   Diet recommendation: dysphagia 2 diet, heart healthy   Filed Weights   05/20/19 1006  Weight: 78.9 kg    History of present illness:  85 y.o.femalewith medical history significant forGERD, hypertension, neuropathy, dyslipidemia, and dementiawho has been noted to have worsening confusion and disorientation at home. She cannot remember who she is and where she is at times as well as recognize family members. There have been no noted behavioral disturbances, but patient has had some weakness to her legs with associated frequent falls. Her family members check up on her on a regular basis and her niece stays with her throughout the day and her son usually visits in the evenings. They help manage her medications and state that it is highly unlikely that she would have overdosed on any of her medications. They deny any upper respiratory symptoms, fevers, chills, or other issues. The patient does live on her own despite the fact that her family members visit frequently and they are afraid that her dementia may be progressing where she may have some other acute issue and will likely require placement to SNF. No urinary incontinence or tongue  biting or seizure-like activity noted at home. No note of any toxic ingestion noted.  ED Course:Vital signs are stable and patient is afebrile. No leukocytosis noted on laboratory data and hemoglobin is 10.2 with platelet count of 90,000. Creatinine is 1.11 which appears to be at patient's baseline. Urine analysis negative for UTI, but lactic acid noted to be 2.0 and patient did receive aztreonam, Flagyl, and vancomycin in the emergency department. CT of the head is negative for any acute abnormalities and CT of the abdomen and pelvis with no acute abnormalities and 4.1 cm AAA noted  Hospital Course:  1-Acute encephalopathy -Appears to be secondary to elevated ammonia level; patient found to be dehydrated and underlying history of dementia also playing a role. -Minimize the use of PRN sedative agents -remains high risk for sundowning. -continue low-dose Risperdal and low dose of ritalin. -For restlessness will recommend using sitter as much as possible and constant  reorientation. -Patient mentation continue slowly improving; but is still not at baseline as per son reports.   2-physical deconditioning -Evaluated by physical therapy -Recommended skilled nursing facility for short-term rehabilitation. -patient participating more with PT  3-Abdominal aortic aneurysm (HCC) -No signs of rupture -Continue outpatient monitoring  4-Essential hypertension -Stable and rising -resume PRN Lasix -Lisinopril has been discontinued at this moment.  (Given tongue swelling and concerns for mild angiodema). -will discharge on low dose amlodipine  -follow vital signs and adjust antihypertensive regimen as needed.  5-Neuropathy -Resume the use of gabapentin at discharge  6-Dementia with behavioral disturbance (HCC) -Continue supportive care. -continue low dose risperdal and ritalin -Prior to this admission living by  herself with family intermittent assistance.  7-hyperlipidemia -resume  statins and continue heart healthy diet   8-acute kidney injury -Improved/resolved with the use of IV fluids -Maintain adequate oral hydration.  9-gastroesophageal reflux disease -Continue Pepcid QHS and daily PPI.  10-tongue swelling/dysphagia  -Excellent response to Pepcid and a steroids -ACE inhibitors discontinued -Appreciate evaluation by speech therapy; continue dysphagia 2 diet with thin liquids.   Procedures:  See below for x-ray reports   Consultations:  None   Discharge Exam: Vitals:   05/24/19 0700 05/24/19 1420  BP:  (!) 120/51  Pulse:  (!) 103  Resp:  17  Temp:  98.3 F (36.8 C)  SpO2: 94% 94%   General exam: Alert, awake, oriented x 1; remains easily reoriented and has started to participate more with physical therapy.  No chest pain, no shortness of breath, no nausea, no vomiting.  Tongue swelling significantly improved and tolerating dysphagia 2 diet with thin liquids. Respiratory system: Clear to auscultation. Respiratory effort normal. Cardiovascular system:RRR. No murmurs, rubs, gallops. Gastrointestinal system: Abdomen is nondistended, soft and nontender. No organomegaly or masses felt. Normal bowel sounds heard. Central nervous system: Alert and oriented. No focal neurological deficits. Extremities: No C/C/E, +pedal pulses Skin: No rashes, lesions or ulcers Psychiatry: Judgement and insight appear impaired secondary to underlying dementia. Stable mood currently.   Discharge Instructions   Discharge Instructions    Diet - low sodium heart healthy   Complete by: As directed    Discharge instructions   Complete by: As directed    Maintain adequate hydration Physical rehabilitation as per SNF protocol Follow up with PCP in 10 days after discharge from SNF Continue close follow up and adjustment on medications for behavioral disturbances in patient with underlying dementia.  Minimize extra sedatives agents. Dysphagia 2 with thin liquids      Allergies as of 05/24/2019      Reactions   Sulfa Antibiotics Anaphylaxis   Cephalexin Itching, Rash      Medication List    STOP taking these medications   lisinopril 10 MG tablet Commonly known as: ZESTRIL     TAKE these medications   amLODipine 5 MG tablet Commonly known as: NORVASC Take 1 tablet (5 mg total) by mouth daily.   famotidine 20 MG tablet Commonly known as: PEPCID Take 1 tablet (20 mg total) by mouth at bedtime.   furosemide 20 MG tablet Commonly known as: LASIX Take 1 tablet (20 mg total) by mouth daily as needed for edema. What changed:   when to take this  reasons to take this   gabapentin 100 MG capsule Commonly known as: NEURONTIN Take 100 mg by mouth 2 (two) times daily. For Neuropathy What changed: Another medication with the same name was removed. Continue taking this medication, and follow the directions you see here.   hydrocortisone 2.5 % rectal cream Commonly known as: ANUSOL-HC Place 1 application rectally 2 (two) times daily. What changed:   when to take this  reasons to take this   methylphenidate 5 MG tablet Commonly known as: RITALIN Take 0.5 tablets (2.5 mg total) by mouth daily. Start taking on: May 25, 2019   pantoprazole 40 MG tablet Commonly known as: PROTONIX Take 1 tablet (40 mg total) by mouth daily.   Potassium Chloride ER 20 MEQ Tbcr Take 20 mEq by mouth every other day.   pravastatin 40 MG tablet Commonly known as: PRAVACHOL Take 40 mg by mouth daily.   risperiDONE 0.25 MG tablet Commonly known  as: RISPERDAL Take 1 tablet (0.25 mg total) by mouth at bedtime.      Allergies  Allergen Reactions  . Sulfa Antibiotics Anaphylaxis  . Cephalexin Itching and Rash    Contact information for follow-up providers    Elfredia NevinsFusco, Lawrence, MD. Schedule an appointment as soon as possible for a visit in 10 day(s).   Specialty: Internal Medicine Why: after discharge form SNF Contact information: [address] [place] [id number] [phone]            Contact information for after-discharge care    [facility] .   Service: Skilled Nursing Contact information: [address] [place] [phone] [phone]                  The results of significant diagnostics from this hospitalization (including imaging, microbiology, ancillary and laboratory) are listed below for reference.    Significant Diagnostic Studies: Ct Head Wo Contrast  Result Date: 05/20/2019 CLINICAL DATA:  Altered mental status.  Multiple recent falls. EXAM: CT HEAD WITHOUT CONTRAST TECHNIQUE: Contiguous axial images were obtained from the base of the skull through the vertex without intravenous contrast. COMPARISON:  CT abdomen pelvis dated May 01, 2019. FINDINGS: Brain: No evidence of acute infarction, hemorrhage, hydrocephalus, extra-axial collection or mass lesion/mass effect. Stable mild-to-moderate atrophy. Vascular: Atherosclerotic vascular calcification of the carotid siphons. No hyperdense vessel. Skull: Normal. Negative for fracture or focal lesion. Sinuses/Orbits: No acute finding. Other: None. IMPRESSION: 1.  No acute intracranial abnormality. Electronically Signed   By: Obie DredgeWilliam T Derry M.D.   On: 05/20/2019 13:18   Ct Head Wo Contrast  Result Date: 05/01/2019 CLINICAL DATA:  Altered mental status and weakness. EXAM: CT HEAD WITHOUT CONTRAST TECHNIQUE: Contiguous axial images were obtained from the base of the skull through the vertex without intravenous contrast. COMPARISON:  07/16/2015. FINDINGS: Brain: Mild-to-moderate enlargement of the ventricles and subarachnoid spaces. Minimal patchy white matter low density in both cerebral hemispheres. No intracranial hemorrhage, mass lesion or CT evidence of acute infarction. Vascular: No hyperdense vessel or unexpected calcification. Skull: Mild bilateral hyperostosis frontalis. Negative for fracture  or focal lesion. Sinuses/Orbits: Status post bilateral cataract extraction. Unremarkable paranasal sinuses. Other: None. IMPRESSION: 1. No acute abnormality. 2. Mild to moderate diffuse cerebral and cerebellar atrophy. 3. Minimal chronic small vessel white matter ischemic changes in both cerebral hemispheres. Electronically Signed   By: Beckie SaltsSteven  Reid M.D.   On: 05/01/2019 21:15   Mr Angio Head Wo Contrast  Result Date: 05/20/2019 CLINICAL DATA:  Altered level of consciousness.  Found on floor. EXAM: MRI HEAD WITHOUT CONTRAST MRA HEAD WITHOUT CONTRAST TECHNIQUE: Multiplanar, multiecho pulse sequences of the brain and surrounding structures were obtained without intravenous contrast. Angiographic images of the head were obtained using MRA technique without contrast. COMPARISON:  CT head 05/20/2019 FINDINGS: MRI HEAD FINDINGS Brain: Image quality degraded by significant motion. Negative for acute infarct. Negative for hemorrhage, mass, or fluid collection. Mild chronic microvascular ischemic type changes in the white matter. Vascular: Normal arterial flow voids with exception of the left vertebral artery which is not visualized. This may be hypoplastic. Skull and upper cervical spine: Negative Sinuses/Orbits: Paranasal sinuses clear.  Bilateral cataract surgery Other: None MRA HEAD FINDINGS Image quality degraded by motion. Right vertebral dominant supplying the basilar and widely patent. Left vertebral artery not visualized and may be hypoplastic. Posterior cerebral arteries patent bilaterally. Cerebellar arteries not well seen due to motion Internal carotid artery  patent bilaterally without stenosis. Anterior and middle cerebral arteries patent. There is artifact obscuring vessel detail. IMPRESSION: 1. Image quality degraded significantly by motion 2. Negative for acute infarct.  No subdural hematoma or mass-effect. 3. MRA degraded by motion. No large vessel occlusion. Left vertebral artery appears hypoplastic.  Electronically Signed   By: Marlan Palauharles  Clark M.D.   On: 05/20/2019 16:26   Mr Brain Wo Contrast  Result Date: 05/20/2019 CLINICAL DATA:  Altered level of consciousness.  Found on floor. EXAM: MRI HEAD WITHOUT CONTRAST MRA HEAD WITHOUT CONTRAST TECHNIQUE: Multiplanar, multiecho pulse sequences of the brain and surrounding structures were obtained without intravenous contrast. Angiographic images of the head were obtained using MRA technique without contrast. COMPARISON:  CT head 05/20/2019 FINDINGS: MRI HEAD FINDINGS Brain: Image quality degraded by significant motion. Negative for acute infarct. Negative for hemorrhage, mass, or fluid collection. Mild chronic microvascular ischemic type changes in the white matter. Vascular: Normal arterial flow voids with exception of the left vertebral artery which is not visualized. This may be hypoplastic. Skull and upper cervical spine: Negative Sinuses/Orbits: Paranasal sinuses clear.  Bilateral cataract surgery Other: None MRA HEAD FINDINGS Image quality degraded by motion. Right vertebral dominant supplying the basilar and widely patent. Left vertebral artery not visualized and may be hypoplastic. Posterior cerebral arteries patent bilaterally. Cerebellar arteries not well seen due to motion Internal carotid artery patent bilaterally without stenosis. Anterior and middle cerebral arteries patent. There is artifact obscuring vessel detail. IMPRESSION: 1. Image quality degraded significantly by motion 2. Negative for acute infarct.  No subdural hematoma or mass-effect. 3. MRA degraded by motion. No large vessel occlusion. Left vertebral artery appears hypoplastic. Electronically Signed   By: Marlan Palauharles  Clark M.D.   On: 05/20/2019 16:26   Ct Abdomen Pelvis W Contrast  Result Date: 05/20/2019 CLINICAL DATA:  Recurrent falls. The patient was found down 05/18/2019. EXAM: CT ABDOMEN AND PELVIS WITH CONTRAST TECHNIQUE: Multidetector CT imaging of the abdomen and pelvis was  performed using the standard protocol following bolus administration of intravenous contrast. CONTRAST:  75 mL OMNIPAQUE IOHEXOL 300 MG/ML  SOLN COMPARISON:  None. FINDINGS: Lower chest: Mild basilar atelectasis on the left. Lung bases otherwise clear. No pleural or pericardial effusion. Cardiomegaly. Hepatobiliary: No focal liver abnormality is seen. Status post cholecystectomy. No biliary dilatation. The dome of the liver is just off the superior margin of the scan. Pancreas: Unremarkable. No pancreatic ductal dilatation or surrounding inflammatory changes. Spleen: Normal in size without focal abnormality. Adrenals/Urinary Tract: The adrenal glands appear normal. Stomach/Bowel: The patient has a few small renal cysts bilaterally. There is some scar in the lower pole of the right kidney. Ureters and urinary bladder are unremarkable. Diverticulosis is worst in the sigmoid colon. No diverticulitis. A small hiatal hernia is seen. Vascular/Lymphatic: Aortic atherosclerosis. Aneurysm of the abdominal aorta measures up to 4.1 cm. No hemorrhage. No enlarged abdominal or pelvic lymph nodes. Reproductive: Uterus and bilateral adnexa are unremarkable. Other: Small fat containing supraumbilical hernia is noted. Musculoskeletal: No acute or focal abnormality. Convex right scoliosis and multilevel spondylosis noted. The patient is status post left hip replacement. IMPRESSION: No acute abnormality abdomen or pelvis. 4.1 cm abdominal aortic aneurysm. Recommend followup by ultrasound in 1 year. This recommendation follows ACR consensus guidelines: White Paper of the ACR Incidental Findings Committee II on Vascular Findings. J Am Coll Radiol 2013; 10:789-794. Aortic aneurysm NOS (ICD10-I71.9) Atherosclerosis. Diverticulosis without diverticulitis. Small fat containing supraumbilical hernia. Small hiatal hernia. Electronically Signed   By: Maisie Fushomas  Dalessio M.D.   On: 05/20/2019 13:19   Dg Chest Port 1 View  Result Date:  05/20/2019 CLINICAL DATA:  Altered mental status, found on floor reside bed on Sunday morning, multiple falls, history hypertension, GERD EXAM: PORTABLE CHEST 1 VIEW COMPARISON:  Portable exam 1038 hours compared to 05/01/2019 FINDINGS: Rotated to the LEFT. Enlargement of cardiac silhouette. Mediastinal contours and pulmonary vascularity normal. Atherosclerotic calcification aorta. Bibasilar opacities favor atelectasis. Remaining lungs clear. No pleural effusion or pneumothorax. Bones demineralized. IMPRESSION: Enlargement of cardiac silhouette with probable bibasilar atelectasis. Electronically Signed   By: Ulyses Southward M.D.   On: 05/20/2019 11:04   Dg Chest Portable 1 View  Result Date: 05/01/2019 CLINICAL DATA:  Weakness. EXAM: PORTABLE CHEST 1 VIEW COMPARISON:  Chest x-ray dated March 22, 2017. FINDINGS: Stable cardiomegaly. Chronic pulmonary vascular congestion and mild interstitial edema, similar to prior study. Probable small left pleural effusion. No consolidation or pneumothorax. No acute osseous abnormality. IMPRESSION: 1. Chronic mild congestive heart failure, similar to prior study. Electronically Signed   By: Obie Dredge M.D.   On: 05/01/2019 19:17    Microbiology: Recent Results (from the past 240 hour(s))  Culture, blood (Routine X 2) w Reflex to ID Panel     Status: None (Preliminary result)   Collection Time: 05/20/19 10:54 AM   Specimen: BLOOD LEFT ARM  Result Value Ref Range Status   Specimen Description BLOOD LEFT ARM  Final   Special Requests   Final    BOTTLES DRAWN AEROBIC AND ANAEROBIC Blood Culture results may not be optimal due to an inadequate volume of blood received in culture bottles   Culture   Final    NO GROWTH 4 DAYS Performed at [facility], [address], [place], [place] [address]    Report Status PENDING  Incomplete  SARS Coronavirus 2 (CEPHEID - Performed in [facility] hospital lab), Hosp Order     Status: None   Collection Time: 05/20/19  1:58 PM    Specimen: Nasopharyngeal Swab  Result Value Ref Range Status   SARS Coronavirus 2 NEGATIVE NEGATIVE Final    Comment: (NOTE) If result is NEGATIVE SARS-CoV-2 target nucleic acids are NOT DETECTED. The SARS-CoV-2 RNA is generally detectable in upper and lower  respiratory specimens during the acute phase of infection. The lowest  concentration of SARS-CoV-2 viral copies this assay can detect is 250  copies / mL. A negative result does not preclude SARS-CoV-2 infection  and should not be used as the sole basis for treatment or other  patient management decisions.  A negative result may occur with  improper specimen collection / handling, submission of specimen other  than nasopharyngeal swab, presence of viral mutation(s) within the  areas targeted by this assay, and inadequate number of viral copies  (<250 copies / mL). A negative result must be combined with clinical  observations, patient history, and epidemiological information. If result is POSITIVE SARS-CoV-2 target nucleic acids are DETECTED. The SARS-CoV-2 RNA is generally detectable in upper and lower  respiratory specimens dur ing the acute phase of infection.  Positive  results are indicative of active infection with SARS-CoV-2.  Clinical  correlation with patient history and other diagnostic information is  necessary to determine patient infection status.  Positive results do  not rule out bacterial infection or co-infection with other viruses. If result is PRESUMPTIVE POSTIVE SARS-CoV-2 nucleic acids MAY BE PRESENT.   A presumptive positive result was obtained on the submitted specimen  and confirmed on repeat  testing.  While 2019 novel coronavirus  (SARS-CoV-2) nucleic acids may be present in the submitted sample  additional confirmatory testing may be necessary for epidemiological  and / or clinical management purposes  to differentiate between  SARS-CoV-2 and other Sarbecovirus currently known to infect humans.  If  clinically indicated additional testing with an alternate test  methodology [phone]) is advised. The SARS-CoV-2 RNA is generally  detectable in upper and lower respiratory sp ecimens during the acute  phase of infection. The expected result is Negative. Fact Sheet for Patients:  StrictlyIdeas.no Fact Sheet for Healthcare Providers: BankingDealers.co.za This test is not yet approved or cleared by the [place] FDA and has been authorized for detection and/or diagnosis of SARS-CoV-2 by FDA under an Emergency Use Authorization (EUA).  This EUA will remain in effect (meaning this test can be used) for the duration of the COVID-19 declaration under Section 564(b)(1) of the Act, 21 U.S.C. section 360bbb-3(b)(1), unless the authorization is terminated or revoked sooner. Performed at [facility], [address], [place], [place] [address]      Labs: Basic Metabolic Panel: Recent Labs  Lab 05/20/19 1028 05/21/19 0522 05/22/19 0813  NA 140 143 140  K 3.9 3.6 3.7  CL 111 116* 111  CO2 21* 23 19*  GLUCOSE 114* 83 88  BUN 14 13 11   CREATININE 1.11* 0.69 0.54  CALCIUM 8.9 8.2* 8.5*  MG  --  1.8  --    Liver Function Tests: Recent Labs  Lab 05/20/19 1028  AST 39  ALT 23  ALKPHOS 112  BILITOT 1.8*  PROT 6.4*  ALBUMIN 2.5*   Recent Labs  Lab 05/20/19 1028  LIPASE 58*   Recent Labs  Lab 05/21/19 0522 05/22/19 1145  AMMONIA 83* 21   CBC: Recent Labs  Lab 05/20/19 1028 05/21/19 0522  WBC 5.3 4.6  NEUTROABS 2.5  --   HGB 10.2* 8.4*  HCT 33.0* 27.9*  MCV 97.3 99.6  PLT 90* 69*   BNP (last 3 results) Recent Labs    05/20/19 1028  BNP 323.0*    Signed:  Barton Dubois MD.  [facility] 05/24/2019, 4:36 PM

## 2019-05-24 NOTE — Progress Notes (Signed)
  Speech Language Pathology Treatment: Dysphagia  Patient Details Name: [name] MRN: [id number] DOB: 12-03-1926 Today's Date: 05/24/2019 Time: 0937-1000 SLP Time Calculation (min) (ACUTE ONLY): 23 min  Assessment / Plan / Recommendation Clinical Impression  Pt seen at bedside for ongoing diagnostic dysphagia intervention. Pt required mod/max cues for alertness and repositioning. Once Pt sufficiently alert, she readily accepted thin liquids via straw, medications whole in puree (althought she masticated the pills), and D2 breakfast. Pt with audible swallow with liquids, but no signs of reduced airway protection. Pt with mildly prolonged oral prep and oral scatter noted with D2. She benefited from liquid wash. Recommend continuing diet as ordered and f/u SLP services at [facility] (likely to transfer today). Above to RN.    HPI HPI: [name] is a 85 y.o. female with medical history significant for GERD, hypertension, neuropathy, dyslipidemia, and dementia who has been noted to have worsening confusion and disorientation at home.  She cannot remember who she is and where she is at times as well as recognize family members.  There have been no noted behavioral disturbances, but patient has had some weakness to her legs with associated frequent falls.  Her family members check up on her on a regular basis and her niece stays with her throughout the day and her son usually visits in the evenings.  They help manage her medications and state that it is highly unlikely that she would have overdosed on any of her medications.  They deny any upper respiratory symptoms, fevers, chills, or other issues.  The patient does live on her own despite the fact that her family members visit frequently and they are afraid that her dementia may be progressing where she may have some other acute issue and will likely require placement to SNF.  No urinary incontinence or tongue biting or seizure-like activity noted at home.   No note of any toxic ingestion noted. Vital signs are stable and patient is afebrile.  No leukocytosis noted on laboratory data and hemoglobin is 10.2 with platelet count of 90,000.  Creatinine is 1.11 which appears to be at patient's baseline.  Urine analysis negative for UTI, but lactic acid noted to be 2.0 and patient did receive aztreonam, Flagyl, and vancomycin in the emergency department.  CT of the head is negative for any acute abnormalities and CT of the abdomen and pelvis with no acute abnormalities and 4.1 cm AAA noted. BSE requested.      SLP Plan  Continue with current plan of care       Recommendations  Diet recommendations: Dysphagia 2 (fine chop);Thin liquid Liquids provided via: Cup;Straw Medication Administration: Whole meds with puree Supervision: Staff to assist with self feeding;Full supervision/cueing for compensatory strategies Compensations: Slow rate;Small sips/bites Postural Changes and/or Swallow Maneuvers: Seated upright 90 degrees;Upright 30-60 min after meal                Oral Care Recommendations: Oral care BID;Staff/trained caregiver to provide oral care Follow up Recommendations: 24 hour supervision/assistance;Skilled Nursing facility SLP Visit Diagnosis: Dysphagia, unspecified (R13.10) Plan: Continue with current plan of care       Thank you,  Genene Churn, [place]                 [place] 05/24/2019, 10:07 AM

## 2019-05-25 LAB — CULTURE, BLOOD (ROUTINE X 2): Culture: NO GROWTH

## 2019-05-25 NOTE — Progress Notes (Signed)
Called pt son Sarajane Marek and updated him that patient will not be going to SNF today. Answered all questions and concerns.

## 2019-05-25 NOTE — Progress Notes (Signed)
No safety sitter needed since 6/27. Patient able to be redirected with verbal cues.

## 2019-05-25 NOTE — Progress Notes (Signed)
CSW spoke with [facility] nursing administrator regarding discharge plans- at this time facility is declining patient admission today 6/28 (until re-evaluated) due to DON stating patient will be needing 24/7 care.   Per facilities RN, DON reviewed DC summary and stated patient needed to much care for facility at this time. DON and facility will evaluate patient/ DC summary on Monday 6/29.   CSW has notified MD and will notify CSW working with patient on Monday.   Kingsley Spittle, LCSW [facility] [place]  [phone]

## 2019-05-25 NOTE — Progress Notes (Signed)
Patient seen and examined. Discharge summary reviewed and appropriate. hemodynamically stable and ready to go to [facility] for further care and rehabilitation. Please refer to written discharge from 05/24/19.  Barton Dubois MD [phone]

## 2019-05-25 NOTE — Progress Notes (Signed)
Patient urinated and saturated linens.  Bladder scan repeated with scan only revealing 14 ml in patient's bladder.  I&O catheter not performed at this time.  Purewick reapplied.  P.J. Linus Mako, RN

## 2019-05-26 ENCOUNTER — Encounter: Payer: Self-pay | Admitting: Adult Health

## 2019-05-26 ENCOUNTER — Inpatient Hospital Stay
Admission: RE | Admit: 2019-05-26 | Discharge: 2019-06-06 | Disposition: A | Discharge status: Home / Self Care | Payer: Medicare Other | Source: Ambulatory Visit | Attending: Internal Medicine | Admitting: Internal Medicine

## 2019-05-26 MED ORDER — BISACODYL 10 MG RE SUPP
10.0000 mg | Freq: Every day | RECTAL | Status: DC | PRN
  Filled 2019-05-26: qty 1

## 2019-05-26 NOTE — Care Management Important Message (Signed)
Important Message  Patient Details  Name: [name] MRN: [id number] Date of Birth: Apr 18, 1927   Medicare Important Message Given:  Yes     [name] 05/26/2019, 12:28 PM

## 2019-05-26 NOTE — Progress Notes (Signed)
  Speech Language Pathology Treatment: Dysphagia  Patient Details Name: [name] MRN: [id number] DOB: [birth year] Today's Date: 05/26/2019 Time: 5009-3818 SLP Time Calculation (min) (ACUTE ONLY): 20 min  Assessment / Plan / Recommendation Clinical Impression  Pt seen in room for ongoing dysphagia intervention. Pt with much improved alertness this date as she was sitting upright and responding verbally to questions. She was able to self feed graham crackers and drink water via a straw with set up assistance. She was unable to self feed with spoon/applesauce. No overt signs or symptoms of aspiration, Pt with mildly prolonged oral phase. She accepted po medications whole in puree followed by liquid wash. Recommend advance to D3/mech soft and thin liquids with SLP f/u at SNF for diet tolerance and upgrades as appropriate. SLP will sign off.    HPI HPI: [name] is a 85 y.o. female with medical history significant for GERD, hypertension, neuropathy, dyslipidemia, and dementia who has been noted to have worsening confusion and disorientation at home.  She cannot remember who she is and where she is at times as well as recognize family members.  There have been no noted behavioral disturbances, but patient has had some weakness to her legs with associated frequent falls.  Her family members check up on her on a regular basis and her niece stays with her throughout the day and her son usually visits in the evenings.  They help manage her medications and state that it is highly unlikely that she would have overdosed on any of her medications.  They deny any upper respiratory symptoms, fevers, chills, or other issues.  The patient does live on her own despite the fact that her family members visit frequently and they are afraid that her dementia may be progressing where she may have some other acute issue and will likely require placement to SNF.  No urinary incontinence or tongue biting or seizure-like  activity noted at home.  No note of any toxic ingestion noted. Vital signs are stable and patient is afebrile.  No leukocytosis noted on laboratory data and hemoglobin is 10.2 with platelet count of 90,000.  Creatinine is 1.11 which appears to be at patient's baseline.  Urine analysis negative for UTI, but lactic acid noted to be 2.0 and patient did receive aztreonam, Flagyl, and vancomycin in the emergency department.  CT of the head is negative for any acute abnormalities and CT of the abdomen and pelvis with no acute abnormalities and 4.1 cm AAA noted. BSE requested.      SLP Plan  Continue with current plan of care       Recommendations  Diet recommendations: Dysphagia 3 (mechanical soft);Thin liquid Liquids provided via: Cup;Straw Medication Administration: Whole meds with puree Supervision: Staff to assist with self feeding;Full supervision/cueing for compensatory strategies Compensations: Slow rate;Small sips/bites Postural Changes and/or Swallow Maneuvers: Seated upright 90 degrees;Upright 30-60 min after meal                Oral Care Recommendations: Oral care BID;Staff/trained caregiver to provide oral care Follow up Recommendations: 24 hour supervision/assistance;Skilled Nursing facility SLP Visit Diagnosis: Dysphagia, unspecified (R13.10) Plan: Continue with current plan of care       Thank you,  Genene Churn, [place]                 [place] 05/26/2019, 11:55 AM

## 2019-05-26 NOTE — Progress Notes (Signed)
RN paged MD to make him aware that patient has not had documented BM since 05/19/2019 and requested PRN medication for patient, awaiting response.  P.J. Linus Mako, RN

## 2019-05-26 NOTE — Progress Notes (Signed)
Patient seen and examined. Unable to be transfer to [facility] on 05/25/19 due to issues getting discharge meds. Patient has remained stable, appropriately following commands and being redirected with verbal cues (very hard of hearing). No hemodynamic abnormalities appreciated and reviewed of her discharge summary remains appropriate. Will discharge to SNF today for further care and rehabilitation. Insurance approval and authorization obtained as per CM/SW reports.     Barton Dubois MD [phone]

## 2019-05-26 NOTE — TOC Transition Note (Signed)
Transition of Care [facility]) - CM/SW Discharge Note   Patient Details  Name: [name] MRN: [id number] Date of Birth: [birth year]  Transition [facility] CM/SW Contact:  Boneta Lucks, RN Phone Number: 05/26/2019, 10:58 AM   Clinical Narrative:  Barriers to Discharge over the weekend, was Pharmacy per MD.  [facility] could not get the medications needed if patient has Sundowners episode Patient has not had a sitter in the hospital for 3 days.  Verified with Tammy at [facility] the bed for discharge today.  MD notified, Autumn RN will call report.   Abigail Butts from [employer] left a message to verify insurance authorization [id number] is active 6/16- 6/30.    Final next level of care: Skilled Nursing Facility Barriers to Discharge: Barriers Resolved   Patient Goals and CMS Choice Patient states their goals for this hospitalization and ongoing recovery are:: to go to SNF.      Discharge Placement  [place]                Patient to be transferred to facility by: Staff at [facility] Name of family member notified: Jori Moll - Son Patient and family notified of of transfer: 05/26/19

## 2019-05-26 NOTE — Progress Notes (Signed)
Nsg Discharge Note  Admit Date:  05/20/2019 Discharge date: 05/26/2019   Aspen I Gonzalez to be D/C'd [facility] per MD order.  AVS completed.    Discharge Medication: Allergies as of 05/26/2019      Reactions   Sulfa Antibiotics Anaphylaxis   Cephalexin Itching, Rash      Medication List    STOP taking these medications   lisinopril 10 MG tablet Commonly known as: ZESTRIL     TAKE these medications   amLODipine 5 MG tablet Commonly known as: NORVASC Take 1 tablet (5 mg total) by mouth daily.   famotidine 20 MG tablet Commonly known as: PEPCID Take 1 tablet (20 mg total) by mouth at bedtime.   furosemide 20 MG tablet Commonly known as: LASIX Take 1 tablet (20 mg total) by mouth daily as needed for edema. What changed:   when to take this  reasons to take this   gabapentin 100 MG capsule Commonly known as: NEURONTIN Take 100 mg by mouth 2 (two) times daily. For Neuropathy What changed: Another medication with the same name was removed. Continue taking this medication, and follow the directions you see here.   hydrocortisone 2.5 % rectal cream Commonly known as: ANUSOL-HC Place 1 application rectally 2 (two) times daily. What changed:   when to take this  reasons to take this   methylphenidate 5 MG tablet Commonly known as: RITALIN Take 0.5 tablets (2.5 mg total) by mouth daily.   pantoprazole 40 MG tablet Commonly known as: PROTONIX Take 1 tablet (40 mg total) by mouth daily.   Potassium Chloride ER 20 MEQ Tbcr Take 20 mEq by mouth every other day.   pravastatin 40 MG tablet Commonly known as: PRAVACHOL Take 40 mg by mouth daily.   risperiDONE 0.25 MG tablet Commonly known as: RISPERDAL Take 1 tablet (0.25 mg total) by mouth at bedtime.       Discharge Assessment: Vitals:   05/25/19 2234 05/26/19 0614  BP: (!) 169/73 (!) 141/67  Pulse: 93 98  Resp: 20 20  Temp: 97.6 F (36.4 C) 98.3 F (36.8 C)  SpO2: 97% 95%   Skin clean, dry  and intact without evidence of skin break down, no evidence of skin tears noted. IV catheter discontinued intact. Site without signs and symptoms of complications - no redness or edema noted at insertion site, patient denies c/o pain - only slight tenderness at site.  Dressing with slight pressure applied.  D/c Instructions-Education:  Report called and given to [place]. All questions were answered and no further questions at this time. Pt in stable condition and in no acute distress at time of discharge. Pt will be escorted by [facility] Staff.   Kevyn Wengert C, RN 05/26/2019 1:32 PM

## 2019-05-26 NOTE — Progress Notes (Signed)
This encounter was created in error - please disregard.

## 2019-05-27 ENCOUNTER — Encounter: Payer: Self-pay | Admitting: Adult Health

## 2019-05-27 ENCOUNTER — Other Ambulatory Visit: Payer: Self-pay | Admitting: Adult Health

## 2019-05-27 ENCOUNTER — Non-Acute Institutional Stay (SKILLED_NURSING_FACILITY): Payer: Medicare Other | Admitting: Adult Health

## 2019-05-27 DIAGNOSIS — I714 Abdominal aortic aneurysm, without rupture, unspecified: Secondary | ICD-10-CM

## 2019-05-27 DIAGNOSIS — I5032 Chronic diastolic (congestive) heart failure: Secondary | ICD-10-CM | POA: Diagnosis not present

## 2019-05-27 DIAGNOSIS — G629 Polyneuropathy, unspecified: Secondary | ICD-10-CM

## 2019-05-27 DIAGNOSIS — F0391 Unspecified dementia with behavioral disturbance: Secondary | ICD-10-CM

## 2019-05-27 DIAGNOSIS — F03918 Unspecified dementia, unspecified severity, with other behavioral disturbance: Secondary | ICD-10-CM

## 2019-05-27 DIAGNOSIS — E785 Hyperlipidemia, unspecified: Secondary | ICD-10-CM

## 2019-05-27 DIAGNOSIS — E876 Hypokalemia: Secondary | ICD-10-CM

## 2019-05-27 DIAGNOSIS — F01518 Vascular dementia, unspecified severity, with other behavioral disturbance: Secondary | ICD-10-CM

## 2019-05-27 DIAGNOSIS — K219 Gastro-esophageal reflux disease without esophagitis: Secondary | ICD-10-CM

## 2019-05-27 DIAGNOSIS — F0151 Vascular dementia with behavioral disturbance: Secondary | ICD-10-CM

## 2019-05-27 DIAGNOSIS — K649 Unspecified hemorrhoids: Secondary | ICD-10-CM

## 2019-05-27 DIAGNOSIS — I110 Hypertensive heart disease with heart failure: Secondary | ICD-10-CM | POA: Diagnosis not present

## 2019-05-27 MED ORDER — METHYLPHENIDATE HCL 5 MG PO TABS: 2.5000 mg | ORAL_TABLET | Freq: Every day | ORAL | 0 refills | Status: DC

## 2019-05-27 NOTE — Progress Notes (Signed)
Location:    [facility] Room Number: 158/P Place of Service:  SNF (31)   CODE STATUS: Full Code  Allergies  Allergen Reactions  . Sulfa Antibiotics Anaphylaxis  . Cephalexin Itching and Rash    Chief Complaint  Patient presents with  . Hospitalization Follow-up    Hospitalization Follow Up Visit    HPI:  She is a 85 year old woman who has been hospitalized from 05-20-19 through 05-26-19. She had been hospitalized due to increased weakness; confusion and falls. She was not recognizing family members. She was treated for acute dehydration and elevated ammonia. Theses have resolved. She is here for short term rehab with her goal to return home. However; this may not be a possibility. She will more than likely require long term placement. She will continue to be followed for her chronic illnesses including: chf; hypertension; dyslipidemia.    Past Medical History:  Diagnosis Date  . GERD (gastroesophageal reflux disease)   . Hyperlipidemia   . Hypertension   . Neuromuscular disorder [facility](HCC)     Past Surgical History:  Procedure Laterality Date  . ABDOMINAL AORTIC ANEURYSM REPAIR    . BREAST BIOPSY    . CHOLECYSTECTOMY    . INGUINAL HERNIA REPAIR    . TOTAL HIP ARTHROPLASTY      Social History   Socioeconomic History  . Marital status: Widowed    Spouse name: Not on file  . Number of children: Not on file  . Years of education: Not on file  . Highest education level: Not on file  Occupational History  . Not on file  Social Needs  . Financial resource strain: Not on file  . Food insecurity    Worry: Not on file    Inability: Not on file  . Transportation needs    Medical: Not on file    Non-medical: Not on file  Tobacco Use  . Smoking status: Former Smoker    Packs/day: 0.50    Types: Cigarettes    Quit date: 11/28/1991    Years since quitting: 27.5  . Smokeless tobacco: Never Used  Substance and Sexual Activity  . Alcohol use: No  . Drug  use: No  . Sexual activity: Not on file  Lifestyle  . Physical activity    Days per week: Not on file    Minutes per session: Not on file  . Stress: Not on file  Relationships  . Social Musicianconnections    Talks on phone: Not on file    Gets together: Not on file    Attends religious service: Not on file    Active member of club or organization: Not on file    Attends meetings of clubs or organizations: Not on file    Relationship status: Not on file  . Intimate partner violence    Fear of current or ex partner: Not on file    Emotionally abused: Not on file    Physically abused: Not on file    Forced sexual activity: Not on file  Other Topics Concern  . Not on file  Social History Narrative  . Not on file   Family History  Problem Relation Age of Onset  . Heart disease Father       VITAL SIGNS BP 99/62   Pulse 86   Temp (!) 96.9 F (36.1 C) (Oral)   Resp 18   Ht 5\' 9"  (1.753 m)   Wt 177 lb 9.6 oz (80.6 kg)  SpO2 94%   BMI 26.23 kg/m   Outpatient Encounter Medications as of 05/27/2019  Medication Sig  . amLODipine (NORVASC) 5 MG tablet Take 5 mg by mouth daily.  . calcium carbonate (TUMS - DOSED IN MG ELEMENTAL CALCIUM) 500 MG chewable tablet Chew 1 tablet by mouth at bedtime.  . furosemide (LASIX) 20 MG tablet Take 1 tablet (20 mg total) by mouth daily as needed for edema.  . gabapentin (NEURONTIN) 100 MG capsule Take 100 mg by mouth 2 (two) times daily. For Neuropathy  . hydrocortisone (ANUSOL-HC) 2.5 % rectal cream Place 1 application rectally 2 (two) times daily.  . methylphenidate (RITALIN) 5 MG tablet Take 0.5 tablets (2.5 mg total) by mouth daily.  . pantoprazole (PROTONIX) 40 MG tablet Take 1 tablet (40 mg total) by mouth daily.  . Potassium Chloride ER 20 MEQ TBCR Take 20 mEq by mouth every other day.   . pravastatin (PRAVACHOL) 40 MG tablet Take 40 mg by mouth daily.   . risperiDONE (RISPERDAL) 0.25 MG tablet Take 1 tablet (0.25 mg total) by mouth at  bedtime.  . [DISCONTINUED] amLODipine (NORVASC) 5 MG tablet Take 1 tablet (5 mg total) by mouth daily.  . [DISCONTINUED] famotidine (PEPCID) 20 MG tablet Take 1 tablet (20 mg total) by mouth at bedtime.   No facility-administered encounter medications on file as of 05/27/2019.      SIGNIFICANT DIAGNOSTIC EXAMS  TODAY:   05-20-19: EEG: This is an abnormal EEG.  There is evidence of  moderate generalized slowing of brain activity and characteristic  waveforms suggestive of a toxic, metabolic, or infectious  encephalopathy. Clinical correlation is recommended.   05-20-19: chest x-ray: Enlargement of cardiac silhouette with probable bibasilar Atelectasis.  05-20-19: ct of head: No acute intracranial abnormality.  04-29-19: ct of abdomen and pelvis:No acute abnormality abdomen or pelvis. 4.1 cm abdominal aortic aneurysm.  Atherosclerosis. Diverticulosis without diverticulitis. Small fat containing supraumbilical hernia. Small hiatal hernia.  05-20-19: MRI/MRA of head:  1. Image quality degraded significantly by motion 2. Negative for acute infarct.  No subdural hematoma or mass-effect. 3. MRA degraded by motion. No large vessel occlusion. Left vertebral artery appears hypoplastic.  LABS REVIEWED TODAY;   05-20-19: wbc 5.3; hgb 10.2; hct 33.0; mcv 97.3; plt 90; glucose 114; bun 14; creat 1.11; k+ 3.9; na++ 140; ca 8.9; total bili 1.8; albumin 2.5; lipase 58; BNP 323; tsh 2.304; vit B 12: 673; blood culture: no growth 05-21-19: wbc 4.6; hgb 8.4; hct 27.9; mcv 99.6; plt 69; glucose 83; bun 13; creat 0.69; k+ 3.6; na++ 143; ca 8.2; mag 1.8; ammonia 83 05-22-19: glucose 83; bun 11; creat 0.54; k+ 3.7; na++ 140; ca 8.5; ammonia 21   Review of Systems  Unable to perform ROS: Dementia (unable to participate )  '   Physical Exam Constitutional:      General: She is not in acute distress.    Appearance: She is well-developed. She is not diaphoretic.  Neck:     Musculoskeletal: Neck supple.      Thyroid: No thyromegaly.  Cardiovascular:     Rate and Rhythm: Normal rate and regular rhythm.     Pulses: Normal pulses.     Heart sounds: Normal heart sounds.  Pulmonary:     Effort: Pulmonary effort is normal. No respiratory distress.     Breath sounds: Normal breath sounds.  Abdominal:     General: Bowel sounds are normal. There is no distension.     Palpations: Abdomen is soft.  Tenderness: There is no abdominal tenderness.  Musculoskeletal:     Right lower leg: No edema.     Left lower leg: No edema.     Comments: Kyphosis Is able to move all extremities   Lymphadenopathy:     Cervical: No cervical adenopathy.  Skin:    General: Skin is warm and dry.  Neurological:     Mental Status: She is alert. She is disoriented.  Psychiatric:        Mood and Affect: Mood normal.      ASSESSMENT/ PLAN:  TODAY:   1. Chronic  Diastolic congestive heart failure: is stable will continue lasix 20 mg daily for weight gain of 3 pounds or more in 24 hours  2. Hypertensive heart disease with chronic diastolic congestive heart failure: is stable b/p 99/62 will continue norvasc 5 mg daily will monitor her status.   3. Abdominal aortic aneurysm without rupture: is 4.1 cm stable will monitor will need ultrasound in June 2021  4. Gastroesophagitis reflux disease without esophagitis: is stable will continue protonix 40 mg daily and tums at hs  5. Hemorrhoids; unspecified type: is stable will continue anusol twice daily   6. Vascular dementia with behavioral disturbance: is without change: weight is 177 pounds; will monitor   7. Neuropathy: is stable will continue neurontin 100 mg twice daily   8. Dyslipidemia: is stable will continue pravachol 40 mg daily   9. Hypokalemia: is stable k+ 3.7 will continue k+ 20 meq every other day   10. Psychosis in elderly with behavioral disturbance: is without change: will continue risperdal 0.25 mg nightly and is taking ritalin 2.5 mg daily    Will check cbc; bmp on 06-02-19      MD is aware of resident's narcotic use and is in agreement with current plan of care. We will attempt to wean resident as apropriate   Ok Edwards NP [facility]  Contact [phone] Monday through Friday 8am- 5pm  After hours call [phone]

## 2019-05-28 ENCOUNTER — Encounter: Payer: Self-pay | Admitting: Adult Health

## 2019-05-28 ENCOUNTER — Non-Acute Institutional Stay (SKILLED_NURSING_FACILITY): Payer: Medicare Other | Admitting: Adult Health

## 2019-05-28 DIAGNOSIS — E785 Hyperlipidemia, unspecified: Secondary | ICD-10-CM | POA: Insufficient documentation

## 2019-05-28 DIAGNOSIS — I5032 Chronic diastolic (congestive) heart failure: Secondary | ICD-10-CM

## 2019-05-28 DIAGNOSIS — F03918 Unspecified dementia, unspecified severity, with other behavioral disturbance: Secondary | ICD-10-CM | POA: Insufficient documentation

## 2019-05-28 DIAGNOSIS — I110 Hypertensive heart disease with heart failure: Secondary | ICD-10-CM | POA: Diagnosis not present

## 2019-05-28 DIAGNOSIS — F0151 Vascular dementia with behavioral disturbance: Secondary | ICD-10-CM

## 2019-05-28 DIAGNOSIS — F0391 Unspecified dementia with behavioral disturbance: Secondary | ICD-10-CM | POA: Diagnosis not present

## 2019-05-28 DIAGNOSIS — E876 Hypokalemia: Secondary | ICD-10-CM | POA: Insufficient documentation

## 2019-05-28 DIAGNOSIS — F01518 Vascular dementia, unspecified severity, with other behavioral disturbance: Secondary | ICD-10-CM

## 2019-05-28 NOTE — Progress Notes (Signed)
Location:    [facility] Room Number: 158/P Place of Service:  SNF (31)   CODE STATUS: Full Code  Allergies  Allergen Reactions  . Sulfa Antibiotics Anaphylaxis  . Cephalexin Itching and Rash    Chief Complaint  Patient presents with  . Acute Visit    72 hour Care Plan Visit    HPI:  We have come together for her 72 hour care plan meeting. Prior to her hospitalization she was living alone and was driving. She will need 24 hour care upon discharge. More than likely she will need long term placement in either assisted living or snf. She is not oriented to herself. She has difficulty focusing with those around her. There are no reports of agitation or anxiety. She is participating in therapy.    Past Medical History:  Diagnosis Date  . GERD (gastroesophageal reflux disease)   . Hyperlipidemia   . Hypertension   . Neuromuscular disorder [facility])     Past Surgical History:  Procedure Laterality Date  . ABDOMINAL AORTIC ANEURYSM REPAIR    . BREAST BIOPSY    . CHOLECYSTECTOMY    . INGUINAL HERNIA REPAIR    . TOTAL HIP ARTHROPLASTY      Social History   Socioeconomic History  . Marital status: Widowed    Spouse name: Not on file  . Number of children: Not on file  . Years of education: Not on file  . Highest education level: Not on file  Occupational History  . Not on file  Social Needs  . Financial resource strain: Not on file  . Food insecurity    Worry: Not on file    Inability: Not on file  . Transportation needs    Medical: Not on file    Non-medical: Not on file  Tobacco Use  . Smoking status: Former Smoker    Packs/day: 0.50    Types: Cigarettes    Quit date: 11/28/1991    Years since quitting: 27.5  . Smokeless tobacco: Never Used  Substance and Sexual Activity  . Alcohol use: No  . Drug use: No  . Sexual activity: Not on file  Lifestyle  . Physical activity    Days per week: Not on file    Minutes per session: Not on file  .  Stress: Not on file  Relationships  . Social Musician on phone: Not on file    Gets together: Not on file    Attends religious service: Not on file    Active member of club or organization: Not on file    Attends meetings of clubs or organizations: Not on file    Relationship status: Not on file  . Intimate partner violence    Fear of current or ex partner: Not on file    Emotionally abused: Not on file    Physically abused: Not on file    Forced sexual activity: Not on file  Other Topics Concern  . Not on file  Social History Narrative  . Not on file   Family History  Problem Relation Age of Onset  . Heart disease Father       VITAL SIGNS BP 123/62   Pulse 87   Temp 97.6 F (36.4 C) (Oral)   Resp 20   Ht 5\' 9"  (1.753 m)   Wt 177 lb 9.6 oz (80.6 kg)   SpO2 96%   BMI 26.23 kg/m   Outpatient Encounter Medications as of  05/28/2019  Medication Sig  . amLODipine (NORVASC) 5 MG tablet Take 5 mg by mouth daily.  . calcium carbonate (TUMS - DOSED IN MG ELEMENTAL CALCIUM) 500 MG chewable tablet Chew 1 tablet by mouth at bedtime.  . furosemide (LASIX) 20 MG tablet Take 1 tablet (20 mg total) by mouth daily as needed for edema.  . gabapentin (NEURONTIN) 100 MG capsule Take 100 mg by mouth 2 (two) times daily. For Neuropathy  . hydrocortisone (ANUSOL-HC) 2.5 % rectal cream Place 1 application rectally 2 (two) times daily.  . methylphenidate (RITALIN) 5 MG tablet Take 0.5 tablets (2.5 mg total) by mouth daily.  . pantoprazole (PROTONIX) 40 MG tablet Take 1 tablet (40 mg total) by mouth daily.  . Potassium Chloride ER 20 MEQ TBCR Take 20 mEq by mouth every other day.   . pravastatin (PRAVACHOL) 40 MG tablet Take 40 mg by mouth daily.   . risperiDONE (RISPERDAL) 0.25 MG tablet Take 1 tablet (0.25 mg total) by mouth at bedtime.   No facility-administered encounter medications on file as of 05/28/2019.      SIGNIFICANT DIAGNOSTIC EXAMS  PREVIOUS:   05-20-19: EEG: This  is an abnormal EEG.  There is evidence of  moderate generalized slowing of brain activity and characteristic  waveforms suggestive of a toxic, metabolic, or infectious  encephalopathy. Clinical correlation is recommended.   05-20-19: chest x-ray: Enlargement of cardiac silhouette with probable bibasilar Atelectasis.  05-20-19: ct of head: No acute intracranial abnormality.  04-29-19: ct of abdomen and pelvis:No acute abnormality abdomen or pelvis. 4.1 cm abdominal aortic aneurysm.  Atherosclerosis. Diverticulosis without diverticulitis. Small fat containing supraumbilical hernia. Small hiatal hernia.  05-20-19: MRI/MRA of head:  1. Image quality degraded significantly by motion 2. Negative for acute infarct.  No subdural hematoma or mass-effect. 3. MRA degraded by motion. No large vessel occlusion. Left vertebral artery appears hypoplastic.  NO NEW EXAMS   LABS REVIEWED PREVIOUS;   05-20-19: wbc 5.3; hgb 10.2; hct 33.0; mcv 97.3; plt 90; glucose 114; bun 14; creat 1.11; k+ 3.9; na++ 140; ca 8.9; total bili 1.8; albumin 2.5; lipase 58; BNP 323; tsh 2.304; vit B 12: 673; blood culture: no growth 05-21-19: wbc 4.6; hgb 8.4; hct 27.9; mcv 99.6; plt 69; glucose 83; bun 13; creat 0.69; k+ 3.6; na++ 143; ca 8.2; mag 1.8; ammonia 83 05-22-19: glucose 83; bun 11; creat 0.54; k+ 3.7; na++ 140; ca 8.5; ammonia 21   NO NEW LABS.   Review of Systems  Unable to perform ROS: Dementia (unable to participate )     Physical Exam Constitutional:      General: She is not in acute distress.    Appearance: She is well-developed. She is not diaphoretic.  Neck:     Musculoskeletal: Neck supple.     Thyroid: No thyromegaly.  Cardiovascular:     Rate and Rhythm: Normal rate and regular rhythm.     Pulses: Normal pulses.     Heart sounds: Normal heart sounds.  Pulmonary:     Effort: Pulmonary effort is normal. No respiratory distress.     Breath sounds: Normal breath sounds.  Abdominal:     General:  Bowel sounds are normal. There is no distension.     Palpations: Abdomen is soft.     Tenderness: There is no abdominal tenderness.  Musculoskeletal:     Right lower leg: No edema.     Left lower leg: No edema.     Comments:  Kyphosis Is able  to move all extremities    Lymphadenopathy:     Cervical: No cervical adenopathy.  Skin:    General: Skin is warm and dry.  Neurological:     Mental Status: She is alert. Mental status is at baseline.  Psychiatric:        Mood and Affect: Mood normal.      ASSESSMENT/ PLAN:  TODAY;   1. Hypertensive heart disease with chronic diastolic heart failure 2. Vascular dementia with behavioral disturbance 3. Psychosis in elderly with behavioral disturbance  Will continue therapy as directed May need to consider increasing her ritalin dose if she continues to struggle with focusing Will continue to monitor her status.   MD is aware of resident's narcotic use and is in agreement with current plan of care. We will attempt to wean resident as apropriate   Synthia Innocenteborah Rhyli Depaula NP [facility]  Contact [phone] Monday through Friday 8am- 5pm  After hours call [phone]

## 2019-06-02 ENCOUNTER — Encounter (HOSPITAL_COMMUNITY)
Admission: RE | Admit: 2019-06-02 | Discharge: 2019-06-02 | Disposition: A | Discharge status: Home / Self Care | Payer: Medicare Other | Source: Skilled Nursing Facility | Attending: Internal Medicine | Admitting: Internal Medicine

## 2019-06-02 DIAGNOSIS — R7989 Other specified abnormal findings of blood chemistry: Secondary | ICD-10-CM | POA: Insufficient documentation

## 2019-06-02 DIAGNOSIS — R6889 Other general symptoms and signs: Secondary | ICD-10-CM | POA: Insufficient documentation

## 2019-06-02 LAB — BASIC METABOLIC PANEL
Anion gap: 7 (ref 5–15)
BUN: 15 mg/dL (ref 8–23)
CO2: 25 mmol/L (ref 22–32)
Calcium: 9.1 mg/dL (ref 8.9–10.3)
Chloride: 106 mmol/L (ref 98–111)
Creatinine, Ser: 0.65 mg/dL (ref 0.44–1.00)
GFR calc Af Amer: >60 mL/min (ref >60)
GFR calc non Af Amer: >60 mL/min (ref >60)
Glucose, Bld: 81 mg/dL (ref 70–99)
Potassium: 3.8 mmol/L (ref 3.5–5.1)
Sodium: 138 mmol/L (ref 135–145)

## 2019-06-02 LAB — CBC
HCT: 31.9 % — ABNORMAL LOW (ref 36.0–46.0)
Hemoglobin: 10 g/dL — ABNORMAL LOW (ref 12.0–15.0)
MCH: 30.5 pg (ref 26.0–34.0)
MCHC: 31.3 g/dL (ref 30.0–36.0)
MCV: 97.3 fL (ref 80.0–100.0)
Platelets: 88 10*3/uL — ABNORMAL LOW (ref 150–400)
RBC: 3.28 MIL/uL — ABNORMAL LOW (ref 3.87–5.11)
RDW: 16.9 % — ABNORMAL HIGH (ref 11.5–15.5)
WBC: 4.4 10*3/uL (ref 4.0–10.5)
nRBC: 0 % (ref 0.0–0.2)

## 2019-06-03 ENCOUNTER — Non-Acute Institutional Stay (SKILLED_NURSING_FACILITY): Payer: Medicare Other | Admitting: Internal Medicine

## 2019-06-03 ENCOUNTER — Encounter: Payer: Self-pay | Admitting: Adult Health

## 2019-06-03 ENCOUNTER — Encounter: Payer: Self-pay | Admitting: Internal Medicine

## 2019-06-03 DIAGNOSIS — R220 Localized swelling, mass and lump, head: Secondary | ICD-10-CM | POA: Diagnosis not present

## 2019-06-03 DIAGNOSIS — G9340 Encephalopathy, unspecified: Secondary | ICD-10-CM | POA: Diagnosis not present

## 2019-06-03 DIAGNOSIS — K224 Dyskinesia of esophagus: Secondary | ICD-10-CM

## 2019-06-03 DIAGNOSIS — I10 Essential (primary) hypertension: Secondary | ICD-10-CM

## 2019-06-03 NOTE — Patient Instructions (Signed)
See assessment and plan under each diagnosis in the problem list and acutely for this visit 

## 2019-06-03 NOTE — Progress Notes (Signed)
Location:   [facility] Room Number: 158 P Place of Service:  SNF (31)   CODE STATUS: Full Code  Allergies  Allergen Reactions  . Sulfa Antibiotics Anaphylaxis  . Cephalexin Itching and Rash    Chief Complaint  Patient presents with  . Medical Management of Chronic Issues        HPI:    Past Medical History:  Diagnosis Date  . GERD (gastroesophageal reflux disease)   . Hyperlipidemia   . Hypertension   . Neuromuscular disorder [facility](H[facility])     Past Surgical History:  Procedure Laterality Date  . ABDOMINAL AORTIC ANEURYSM REPAIR    . BREAST BIOPSY    . CHOLECYSTECTOMY    . INGUINAL HERNIA REPAIR    . TOTAL HIP ARTHROPLASTY      Social History   Socioeconomic History  . Marital status: Widowed    Spouse name: Not on file  . Number of children: Not on file  . Years of education: Not on file  . Highest education level: Not on file  Occupational History  . Not on file  Social Needs  . Financial resource strain: Not on file  . Food insecurity    Worry: Not on file    Inability: Not on file  . Transportation needs    Medical: Not on file    Non-medical: Not on file  Tobacco Use  . Smoking status: Former Smoker    Packs/day: 0.50    Types: Cigarettes    Quit date: 11/28/1991    Years since quitting: 27.5  . Smokeless tobacco: Never Used  Substance and Sexual Activity  . Alcohol use: No  . Drug use: No  . Sexual activity: Not on file  Lifestyle  . Physical activity    Days per week: Not on file    Minutes per session: Not on file  . Stress: Not on file  Relationships  . Social Musicianconnections    Talks on phone: Not on file    Gets together: Not on file    Attends religious service: Not on file    Active member of club or organization: Not on file    Attends meetings of clubs or organizations: Not on file    Relationship status: Not on file  . Intimate partner violence    Fear of current or ex partner: Not on file   Emotionally abused: Not on file    Physically abused: Not on file    Forced sexual activity: Not on file  Other Topics Concern  . Not on file  Social History Narrative  . Not on file   Family History  Problem Relation Age of Onset  . Heart disease Father       VITAL SIGNS BP 119/70   Pulse 70   Temp 97.7 F (36.5 C)   Resp 18   Ht 5\' 9"  (1.753 m)   BMI 26.23 kg/m   Outpatient Encounter Medications as of 06/03/2019  Medication Sig  . amLODipine (NORVASC) 5 MG tablet Take 5 mg by mouth daily.  . calcium carbonate (TUMS - DOSED IN MG ELEMENTAL CALCIUM) 500 MG chewable tablet Chew 1 tablet by mouth at bedtime.  . furosemide (LASIX) 20 MG tablet Take 1 tablet (20 mg total) by mouth daily as needed for edema.  . gabapentin (NEURONTIN) 100 MG capsule Take 100 mg by mouth 2 (two) times daily. For Neuropathy  . hydrocortisone (ANUSOL-HC) 2.5 % rectal cream Place 1 application rectally  2 (two) times daily.  . methylphenidate (RITALIN) 5 MG tablet Take 0.5 tablets (2.5 mg total) by mouth daily.  . NON FORMULARY Diet type:  Dysphagia 2, NAS  . Nutritional Supplements (ENSURE ENLIVE PO) Take 1 Bottle by mouth 2 (two) times daily between meals.  . pantoprazole (PROTONIX) 40 MG tablet Take 1 tablet (40 mg total) by mouth daily.  . Potassium Chloride ER 20 MEQ TBCR Take 20 mEq by mouth every other day.   . pravastatin (PRAVACHOL) 40 MG tablet Take 40 mg by mouth daily.   . risperiDONE (RISPERDAL) 0.25 MG tablet Take 1 tablet (0.25 mg total) by mouth at bedtime.  Marland Kitchen UNABLE TO FIND Tegraderm - Apply thin transparent film, Tegraderm to left elbow, check daily and change every 7 days and as needed.  Ensure dressing extends 1 - 2 inches beyond border   No facility-administered encounter medications on file as of 06/03/2019.      SIGNIFICANT DIAGNOSTIC EXAMS  PREVIOUS:   05-20-19: EEG: This is an abnormal EEG.  There is evidence of  moderate generalized slowing of brain activity and  characteristic  waveforms suggestive of a toxic, metabolic, or infectious  encephalopathy. Clinical correlation is recommended.   05-20-19: chest x-ray: Enlargement of cardiac silhouette with probable bibasilar Atelectasis.  05-20-19: ct of head: No acute intracranial abnormality.  04-29-19: ct of abdomen and pelvis:No acute abnormality abdomen or pelvis. 4.1 cm abdominal aortic aneurysm.  Atherosclerosis. Diverticulosis without diverticulitis. Small fat containing supraumbilical hernia. Small hiatal hernia.  05-20-19: MRI/MRA of head:  1. Image quality degraded significantly by motion 2. Negative for acute infarct.  No subdural hematoma or mass-effect. 3. MRA degraded by motion. No large vessel occlusion. Left vertebral artery appears hypoplastic.  NO NEW EXAMS   LABS REVIEWED PREVIOUS;   05-20-19: wbc 5.3; hgb 10.2; hct 33.0; mcv 97.3; plt 90; glucose 114; bun 14; creat 1.11; k+ 3.9; na++ 140; ca 8.9; total bili 1.8; albumin 2.5; lipase 58; BNP 323; tsh 2.304; vit B 12: 673; blood culture: no growth 05-21-19: wbc 4.6; hgb 8.4; hct 27.9; mcv 99.6; plt 69; glucose 83; bun 13; creat 0.69; k+ 3.6; na++ 143; ca 8.2; mag 1.8; ammonia 83 05-22-19: glucose 83; bun 11; creat 0.54; k+ 3.7; na++ 140; ca 8.5; ammonia 21   NO NEW LABS.   Review of Systems  Unable to perform ROS: Dementia (unable to participate )     Physical Exam Constitutional:      General: She is not in acute distress.    Appearance: She is well-developed. She is not diaphoretic.  Neck:     Musculoskeletal: Neck supple.     Thyroid: No thyromegaly.  Cardiovascular:     Rate and Rhythm: Normal rate and regular rhythm.     Pulses: Normal pulses.     Heart sounds: Normal heart sounds.  Pulmonary:     Effort: Pulmonary effort is normal. No respiratory distress.     Breath sounds: Normal breath sounds.  Abdominal:     General: Bowel sounds are normal. There is no distension.     Palpations: Abdomen is soft.      Tenderness: There is no abdominal tenderness.  Musculoskeletal:     Right lower leg: No edema.     Left lower leg: No edema.     Comments:  Kyphosis Is able to move all extremities    Lymphadenopathy:     Cervical: No cervical adenopathy.  Skin:    General: Skin is warm and  dry.  Neurological:     Mental Status: She is alert. Mental status is at baseline.  Psychiatric:        Mood and Affect: Mood normal.      ASSESSMENT/ PLAN:  TODAY;   1. Hypertensive heart disease with chronic diastolic heart failure 2. Vascular dementia with behavioral disturbance 3. Psychosis in elderly with behavioral disturbance  Will continue therapy as directed May need to consider increasing her ritalin dose if she continues to struggle with focusing Will continue to monitor her status.   MD is aware of resident's narcotic use and is in agreement with current plan of care. We will attempt to wean resident as apropriate   Ok Edwards NP [facility]  Contact [phone] Monday through Friday 8am- 5pm  After hours call [phone]  This encounter was created in error - please disregard.

## 2019-06-03 NOTE — Assessment & Plan Note (Signed)
Formal neurocognitive testing such as SLUMS is indicated to define the severity of her baseline dementia. Psych should be involved as she is on atypical psychotropic agents.  She exhibited no behavioral issues during my exam.

## 2019-06-03 NOTE — Assessment & Plan Note (Addendum)
06/03/2019 glossal edema is not clinically present.  ACE inhibitors and ARBs should be avoided. Data entered into Allergy List

## 2019-06-03 NOTE — Assessment & Plan Note (Signed)
Dysphagia 2 diet will be continued at the SNF

## 2019-06-03 NOTE — Assessment & Plan Note (Signed)
BP controlled; no change in antihypertensive medications  

## 2019-06-03 NOTE — Progress Notes (Signed)
This is a comprehensive admission note to [place] performed on this date less than 30 days from date of admission. Included are preadmission medical/surgical history;reconciled medication list; family history; social history and comprehensive review of systems.  Corrections and additions to the records were documented . Comprehensive physical exam was also performed. Additionally a clinical summary was entered for each active diagnosis pertinent to this admission in the Problem List to enhance continuity of care.  PCP: Redmond School, MD  HPI: Patient was hospitalized 6/23-6/29/2020 with progressive confusion and disorientation at home.  It was severe enough that she could not recognize who she was, her location or even recognize family members.  There had been no definite abnormal behavior but the patient has exhibited weakness in her lower extremities with recurrent falls.  Her niece has been staying with her throughout the day and her son typically visits in the evenings and manages her medications.  They do not believe that she has missed any doses or overdosed on any of the medications. In the ED urinalysis was negative but lactic acid level was 2.0.  The patient did receive aztreonam, Flagyl, and vancomycin in the ED.  CT of the head revealed no acute process.  Patient was found to have an elevated ammonia level in the context of dehydration.  An incidental finding was a 4.1 cm AAA  noted on CT of the abdomen/pelvis. Sedative medications were to be minimized.  Low-dose Risperdal and Ritalin were continued.  One-on-one supervision with a sitter was recommended as much as possible.  Her mentation did improve slightly while hospitalized but had not returned to baseline according to her son. Lisinopril was discontinued given concerns of glossal edema and possible mild angioedema.  Low-dose amlodipine was initiated. PT/OT was recommended for deconditioning.  Speech therapy recommended  a dysphagia 2 diet as the patient did exhibit some dysphagia.  BMET and CBC and differential are recommended in 1 week.  Past medical and surgical history: Includes essential hypertension, dyslipidemia, & GERD. Surgeries include history of abdominal aortic aneurysm repair, breast biopsy, total hip arthroplasty, cholecystectomy, and inguinal hernia repair.  Social history: Nondrinker; she is a former smoker.  Family history: Limited history reviewed.  History is noncontributory as she is 85 years old.  Review of systems: She was able to name the president and after period time was able to tell me it was 2020.  She did not understand why I could not take off my mask.  She could not understand why she was here as "I never been sick".  She states that "I am good enough to do my work".  By that she means cooking and cleaning her home.  She states that she "just likes to sleep".  She denies any active symptoms.  Constitutional: No fever,significant weight change, fatigue  Cardiovascular: No chest pain, palpitations,paroxysmal nocturnal dyspnea, claudication, edema  Respiratory: No cough, sputum production,hemoptysis, DOE , significant snoring,apnea   Gastrointestinal: No heartburn,dysphagia,abdominal pain, nausea / vomiting,rectal bleeding, melena,change in bowels Genitourinary: No dysuria,hematuria, pyuria,  incontinence, nocturia Musculoskeletal: No joint stiffness, joint swelling, weakness,pain Dermatologic: No rash, pruritus, change in appearance of skin Neurologic: No dizziness,headache,syncope, seizures, numbness , tingling Psychiatric: No significant anxiety , depression  Hematologic/lymphatic: No significant bruising, lymphadenopathy,abnormal bleeding  Physical exam:  Pertinent or positive findings: She appears her stated age.  She is markedly hard of hearing.  The left lower eyelid is puffy.  Her teeth are coated.  Heart sounds are distant.  Breath  sounds are decreased anteriorly. Aortic  pulsations are palpable.  Clinically I cannot define an aneurysm.  She has 1/2+ edema.  Pedal pulses are decreased.  Strength could not be tested adequately as she does not follow commands well.  She has mild osteoarthritic changes in the hands.  Her fingers are elongated.  General appearance:Adequately nourished; no acute distress , increased work of breathing is present.   Lymphatic: No lymphadenopathy about the head, neck, axilla . Eyes: No conjunctival inflammation or lid edema is present. There is no scleral icterus. Ears:  External ear exam shows no significant lesions or deformities.   Nose:  External nasal examination shows no deformity or inflammation. Nasal mucosa are pink and moist without lesions ,exudates Oral exam: lips and gums are healthy appearing.There is no oropharyngeal erythema or exudate . Neck:  No thyromegaly, masses, tenderness noted.    Heart:  No gallop, murmur, click, rub .  Lungs: without wheezes, rhonchi,rales , rubs. Abdomen:Bowel sounds are normal. Abdomen is soft and nontender with no organomegaly, hernias,masses. GU: deferred  Extremities:  No cyanosis, clubbing  Neurologic exam : Balance,Rhomberg,finger to nose testing could not be completed due to clinical state Skin: Warm & dry w/o tenting. No significant lesions or rash.  See clinical summary under each active problem in the Problem List with associated updated therapeutic plan

## 2019-06-04 ENCOUNTER — Encounter: Payer: Self-pay | Admitting: Adult Health

## 2019-06-04 ENCOUNTER — Non-Acute Institutional Stay (SKILLED_NURSING_FACILITY): Payer: Medicare Other | Admitting: Adult Health

## 2019-06-04 DIAGNOSIS — F0151 Vascular dementia with behavioral disturbance: Secondary | ICD-10-CM

## 2019-06-04 DIAGNOSIS — F0391 Unspecified dementia with behavioral disturbance: Secondary | ICD-10-CM | POA: Diagnosis not present

## 2019-06-04 DIAGNOSIS — F01518 Vascular dementia, unspecified severity, with other behavioral disturbance: Secondary | ICD-10-CM

## 2019-06-04 DIAGNOSIS — F03918 Unspecified dementia, unspecified severity, with other behavioral disturbance: Secondary | ICD-10-CM

## 2019-06-04 DIAGNOSIS — I5032 Chronic diastolic (congestive) heart failure: Secondary | ICD-10-CM | POA: Diagnosis not present

## 2019-06-04 NOTE — Progress Notes (Signed)
Location:   [facility] Room Number: 158 P Place of Service:  SNF (31)   CODE STATUS: Full Code  Allergies  Allergen Reactions   Sulfa Antibiotics Anaphylaxis   Angiotensin Receptor Blockers     6/23-6/29/2020 clinical glossal edema and mild angioedema on lisinopril while hospitalized for encephalopathy   Lisinopril     6/23-6/29/2020 clinical glossal edema and mild angioedema   Cephalexin Itching and Rash    Chief Complaint  Patient presents with   Acute Visit    Care Plan meeting    HPI:  We have come together for her care plan meeting for discharge planning. She does have family present via phone. BIMS 10/15. She had been living alone prior to her hospitalization. Her has poor cognition and will need 24 hour supervision upon discharge.he goal remains for her to return back home.  Her family has verbalized understanding. She continues to participate with therapy has poor balance is able to ambulate 75-100 feet with walker and standby assistance. She is losing weight; has a poor appetite. We will stop pravachol at this time due to her weight loss.   Past Medical History:  Diagnosis Date   GERD (gastroesophageal reflux disease)    Hyperlipidemia    Hypertension    Neuromuscular disorder (HCC)     Past Surgical History:  Procedure Laterality Date   ABDOMINAL AORTIC ANEURYSM REPAIR     BREAST BIOPSY     CHOLECYSTECTOMY     INGUINAL HERNIA REPAIR     TOTAL HIP ARTHROPLASTY      Social History   Socioeconomic History   Marital status: Widowed    Spouse name: Not on file   Number of children: Not on file   Years of education: Not on file   Highest education level: Not on file  Occupational History   Not on file  Social Needs   Financial resource strain: Not on file   Food insecurity    Worry: Not on file    Inability: Not on file   Transportation needs    Medical: Not on file    Non-medical: Not on file   Tobacco Use   Smoking status: Former Smoker    Packs/day: 0.50    Types: Cigarettes    Quit date: 11/28/1991    Years since quitting: 27.5   Smokeless tobacco: Never Used  Substance and Sexual Activity   Alcohol use: No   Drug use: No   Sexual activity: Not on file  Lifestyle   Physical activity    Days per week: Not on file    Minutes per session: Not on file   Stress: Not on file  Relationships   Social connections    Talks on phone: Not on file    Gets together: Not on file    Attends religious service: Not on file    Active member of club or organization: Not on file    Attends meetings of clubs or organizations: Not on file    Relationship status: Not on file   Intimate partner violence    Fear of current or ex partner: Not on file    Emotionally abused: Not on file    Physically abused: Not on file    Forced sexual activity: Not on file  Other Topics Concern   Not on file  Social History Narrative   Not on file   Family History  Problem Relation Age of Onset   Heart disease Father  VITAL SIGNS BP 130/60    Pulse 62    Temp 98.1 F (36.7 C)    Resp 20    Ht 5\' 9"  (1.753 m)    Wt 177 lb (80.3 kg)    BMI 26.14 kg/m   Outpatient Encounter Medications as of 06/04/2019  Medication Sig   amLODipine (NORVASC) 5 MG tablet Take 5 mg by mouth daily.   calcium carbonate (TUMS - DOSED IN MG ELEMENTAL CALCIUM) 500 MG chewable tablet Chew 1 tablet by mouth at bedtime.   furosemide (LASIX) 20 MG tablet Take 1 tablet (20 mg total) by mouth daily as needed for edema.   gabapentin (NEURONTIN) 100 MG capsule Take 100 mg by mouth 2 (two) times daily. For Neuropathy   hydrocortisone (ANUSOL-HC) 2.5 % rectal cream Place 1 application rectally 2 (two) times daily.   methylphenidate (RITALIN) 5 MG tablet Take 0.5 tablets (2.5 mg total) by mouth daily.   NON FORMULARY Diet type:  Dysphagia 2, NAS   Nutritional Supplements (ENSURE ENLIVE PO) Take 1 Bottle by  mouth 2 (two) times daily between meals.   pantoprazole (PROTONIX) 40 MG tablet Take 1 tablet (40 mg total) by mouth daily.   Potassium Chloride ER 20 MEQ TBCR Take 20 mEq by mouth every other day.    pravastatin (PRAVACHOL) 40 MG tablet Take 40 mg by mouth daily.    risperiDONE (RISPERDAL) 0.25 MG tablet Take 1 tablet (0.25 mg total) by mouth at bedtime.   [DISCONTINUED] UNABLE TO FIND Tegraderm - Apply thin transparent film, Tegraderm to left elbow, check daily and change every 7 days and as needed.  Ensure dressing extends 1 - 2 inches beyond border   No facility-administered encounter medications on file as of 06/04/2019.      SIGNIFICANT DIAGNOSTIC EXAMS  PREVIOUS:   05-20-19: EEG: This is an abnormal EEG.  There is evidence of  moderate generalized slowing of brain activity and characteristic  waveforms suggestive of a toxic, metabolic, or infectious  encephalopathy. Clinical correlation is recommended.   05-20-19: chest x-ray: Enlargement of cardiac silhouette with probable bibasilar Atelectasis.  05-20-19: ct of head: No acute intracranial abnormality.  04-29-19: ct of abdomen and pelvis:No acute abnormality abdomen or pelvis. 4.1 cm abdominal aortic aneurysm.  Atherosclerosis. Diverticulosis without diverticulitis. Small fat containing supraumbilical hernia. Small hiatal hernia.  05-20-19: MRI/MRA of head:  1. Image quality degraded significantly by motion 2. Negative for acute infarct.  No subdural hematoma or mass-effect. 3. MRA degraded by motion. No large vessel occlusion. Left vertebral artery appears hypoplastic.  NO NEW EXAMS   LABS REVIEWED PREVIOUS;   05-20-19: wbc 5.3; hgb 10.2; hct 33.0; mcv 97.3; plt 90; glucose 114; bun 14; creat 1.11; k+ 3.9; na++ 140; ca 8.9; total bili 1.8; albumin 2.5; lipase 58; BNP 323; tsh 2.304; vit B 12: 673; blood culture: no growth 05-21-19: wbc 4.6; hgb 8.4; hct 27.9; mcv 99.6; plt 69; glucose 83; bun 13; creat 0.69; k+ 3.6; na++  143; ca 8.2; mag 1.8; ammonia 83 05-22-19: glucose 83; bun 11; creat 0.54; k+ 3.7; na++ 140; ca 8.5; ammonia 21   NO NEW LABS.   Review of Systems  Unable to perform ROS: Dementia (unable to participate )     Physical Exam Constitutional:      General: She is not in acute distress.    Appearance: She is well-developed. She is not diaphoretic.  Neck:     Musculoskeletal: Neck supple.     Thyroid: No thyromegaly.  Cardiovascular:     Rate and Rhythm: Normal rate and regular rhythm.     Pulses: Normal pulses.     Heart sounds: Normal heart sounds.  Pulmonary:     Effort: Pulmonary effort is normal. No respiratory distress.     Breath sounds: Normal breath sounds.  Abdominal:     General: Bowel sounds are normal. There is no distension.     Palpations: Abdomen is soft.     Tenderness: There is no abdominal tenderness.  Musculoskeletal:     Right lower leg: No edema.     Left lower leg: No edema.     Comments:  Kyphosis Is able to move all extremities     Lymphadenopathy:     Cervical: No cervical adenopathy.  Skin:    General: Skin is warm and dry.  Neurological:     Mental Status: She is alert. Mental status is at baseline.  Psychiatric:        Mood and Affect: Mood normal.      ASSESSMENT/ PLAN:  TODAY;   1. Vascular dementia with behavioral disturbance 2. Psychosis in elderly with behavioral disturbance 3. Chronic diastolic (congestive) heart failure    Will stop her pravachol at this time due to weight loss Will continue therapy at this time.  Will continue to monitor her status.  At this time there is no projected discharge date.    MD is aware of resident's narcotic use and is in agreement with current plan of care. We will attempt to wean resident as apropriate   Ok Edwards NP [facility]  Contact [phone] Monday through Friday 8am- 5pm  After hours call [phone]

## 2019-06-06 ENCOUNTER — Encounter: Payer: Self-pay | Admitting: Adult Health

## 2019-06-06 ENCOUNTER — Non-Acute Institutional Stay (SKILLED_NURSING_FACILITY): Payer: Medicare Other | Admitting: Adult Health

## 2019-06-06 ENCOUNTER — Other Ambulatory Visit: Payer: Self-pay | Admitting: Adult Health

## 2019-06-06 DIAGNOSIS — F01518 Vascular dementia, unspecified severity, with other behavioral disturbance: Secondary | ICD-10-CM

## 2019-06-06 DIAGNOSIS — F03918 Unspecified dementia, unspecified severity, with other behavioral disturbance: Secondary | ICD-10-CM

## 2019-06-06 DIAGNOSIS — F0391 Unspecified dementia with behavioral disturbance: Secondary | ICD-10-CM | POA: Diagnosis not present

## 2019-06-06 DIAGNOSIS — I5032 Chronic diastolic (congestive) heart failure: Secondary | ICD-10-CM | POA: Diagnosis not present

## 2019-06-06 DIAGNOSIS — F0151 Vascular dementia with behavioral disturbance: Secondary | ICD-10-CM | POA: Diagnosis not present

## 2019-06-06 DIAGNOSIS — I110 Hypertensive heart disease with heart failure: Secondary | ICD-10-CM

## 2019-06-06 MED ORDER — PANTOPRAZOLE SODIUM 40 MG PO TBEC: 40.0000 mg | DELAYED_RELEASE_TABLET | Freq: Every day | ORAL | 0 refills | Status: DC

## 2019-06-06 MED ORDER — POTASSIUM CHLORIDE ER 20 MEQ PO TBCR: 20.0000 meq | EXTENDED_RELEASE_TABLET | ORAL | 0 refills | Status: DC

## 2019-06-06 MED ORDER — RISPERIDONE 0.25 MG PO TABS: 0.2500 mg | ORAL_TABLET | Freq: Every day | ORAL | 0 refills | Status: DC

## 2019-06-06 MED ORDER — METHYLPHENIDATE HCL 5 MG PO TABS: 2.5000 mg | ORAL_TABLET | Freq: Every day | ORAL | 0 refills | Status: DC

## 2019-06-06 MED ORDER — GABAPENTIN 100 MG PO CAPS: 100.0000 mg | ORAL_CAPSULE | Freq: Two times a day (BID) | ORAL | 0 refills | Status: DC

## 2019-06-06 MED ORDER — AMLODIPINE BESYLATE 5 MG PO TABS: 5.0000 mg | ORAL_TABLET | Freq: Every day | ORAL | 0 refills | Status: DC

## 2019-06-06 MED ORDER — FUROSEMIDE 20 MG PO TABS: 20.0000 mg | ORAL_TABLET | Freq: Every day | ORAL | 0 refills | Status: DC | PRN

## 2019-06-06 NOTE — Progress Notes (Signed)
Location:    [facility]   Place of Service:  SNF (31)    CODE STATUS: Full Code  Allergies  Allergen Reactions  . Sulfa Antibiotics Anaphylaxis  . Angiotensin Receptor Blockers     6/23-6/29/2020 clinical glossal edema and mild angioedema on lisinopril while hospitalized for encephalopathy  . Lisinopril     6/23-6/29/2020 clinical glossal edema and mild angioedema  . Cephalexin Itching and Rash    Chief Complaint  Patient presents with  . Discharge Note    Discharging to home on 06/06/2019    HPI:  She is being discharged to home with home health for pt/ot/rn/cna.she will need a standard wheelchair. She will need her prescriptions written and will need to follow up with her medical provider.  She had been hospitalized for increased weakness and dehydration. She was admitted to this facility for short term rehab. She has completed therapy at a snf level and is now ready to complete on a home health basis.      Past Medical History:  Diagnosis Date  . GERD (gastroesophageal reflux disease)   . Hyperlipidemia   . Hypertension   . Neuromuscular disorder [facility])     Past Surgical History:  Procedure Laterality Date  . ABDOMINAL AORTIC ANEURYSM REPAIR    . BREAST BIOPSY    . CHOLECYSTECTOMY    . INGUINAL HERNIA REPAIR    . TOTAL HIP ARTHROPLASTY      Social History   Socioeconomic History  . Marital status: Widowed    Spouse name: Not on file  . Number of children: Not on file  . Years of education: Not on file  . Highest education level: Not on file  Occupational History  . Not on file  Social Needs  . Financial resource strain: Not on file  . Food insecurity    Worry: Not on file    Inability: Not on file  . Transportation needs    Medical: Not on file    Non-medical: Not on file  Tobacco Use  . Smoking status: Former Smoker    Packs/day: 0.50    Types: Cigarettes    Quit date: 11/28/1991    Years since quitting: 27.5  . Smokeless  tobacco: Never Used  Substance and Sexual Activity  . Alcohol use: No  . Drug use: No  . Sexual activity: Not on file  Lifestyle  . Physical activity    Days per week: Not on file    Minutes per session: Not on file  . Stress: Not on file  Relationships  . Social Herbalist on phone: Not on file    Gets together: Not on file    Attends religious service: Not on file    Active member of club or organization: Not on file    Attends meetings of clubs or organizations: Not on file    Relationship status: Not on file  . Intimate partner violence    Fear of current or ex partner: Not on file    Emotionally abused: Not on file    Physically abused: Not on file    Forced sexual activity: Not on file  Other Topics Concern  . Not on file  Social History Narrative  . Not on file   Family History  Problem Relation Age of Onset  . Heart disease Father     VITAL SIGNS BP 107/65   Pulse 72   Temp 99.3 F (37.4 C)   Resp  18   Ht 5\' 9"  (1.753 m)   Wt 151 lb (68.5 kg)   BMI 22.30 kg/m   Patient's Medications  New Prescriptions   No medications on file  Previous Medications   AMLODIPINE (NORVASC) 5 MG TABLET    Take 5 mg by mouth daily.   CALCIUM CARBONATE (TUMS - DOSED IN MG ELEMENTAL CALCIUM) 500 MG CHEWABLE TABLET    Chew 1 tablet by mouth at bedtime.   FUROSEMIDE (LASIX) 20 MG TABLET    Take 1 tablet (20 mg total) by mouth daily as needed for edema.   GABAPENTIN (NEURONTIN) 100 MG CAPSULE    Take 100 mg by mouth 2 (two) times daily. For Neuropathy   HYDROCORTISONE (ANUSOL-HC) 2.5 % RECTAL CREAM    Place 1 application rectally 2 (two) times daily.   METHYLPHENIDATE (RITALIN) 5 MG TABLET    Take 0.5 tablets (2.5 mg total) by mouth daily.   NON FORMULARY    Diet type:  Dysphagia 2, NAS   NUTRITIONAL SUPPLEMENTS (ENSURE ENLIVE PO)    Take 1 Bottle by mouth 2 (two) times daily between meals.   PANTOPRAZOLE (PROTONIX) 40 MG TABLET    Take 1 tablet (40 mg total) by mouth  daily.   POTASSIUM CHLORIDE ER 20 MEQ TBCR    Take 20 mEq by mouth every other day.    RISPERIDONE (RISPERDAL) 0.25 MG TABLET    Take 1 tablet (0.25 mg total) by mouth at bedtime.  Modified Medications   No medications on file  Discontinued Medications   PRAVASTATIN (PRAVACHOL) 40 MG TABLET    Take 40 mg by mouth daily.      SIGNIFICANT DIAGNOSTIC EXAMS  PREVIOUS:   05-20-19: EEG: This is an abnormal EEG.  There is evidence of  moderate generalized slowing of brain activity and characteristic  waveforms suggestive of a toxic, metabolic, or infectious  encephalopathy. Clinical correlation is recommended.   05-20-19: chest x-ray: Enlargement of cardiac silhouette with probable bibasilar Atelectasis.  05-20-19: ct of head: No acute intracranial abnormality.  04-29-19: ct of abdomen and pelvis:No acute abnormality abdomen or pelvis. 4.1 cm abdominal aortic aneurysm.  Atherosclerosis. Diverticulosis without diverticulitis. Small fat containing supraumbilical hernia. Small hiatal hernia.  05-20-19: MRI/MRA of head:  1. Image quality degraded significantly by motion 2. Negative for acute infarct.  No subdural hematoma or mass-effect. 3. MRA degraded by motion. No large vessel occlusion. Left vertebral artery appears hypoplastic.  NO NEW EXAMS   LABS REVIEWED PREVIOUS;   05-20-19: wbc 5.3; hgb 10.2; hct 33.0; mcv 97.3; plt 90; glucose 114; bun 14; creat 1.11; k+ 3.9; na++ 140; ca 8.9; total bili 1.8; albumin 2.5; lipase 58; BNP 323; tsh 2.304; vit B 12: 673; blood culture: no growth 05-21-19: wbc 4.6; hgb 8.4; hct 27.9; mcv 99.6; plt 69; glucose 83; bun 13; creat 0.69; k+ 3.6; na++ 143; ca 8.2; mag 1.8; ammonia 83 05-22-19: glucose 83; bun 11; creat 0.54; k+ 3.7; na++ 140; ca 8.5; ammonia 21   NO NEW LABS.    Review of Systems  Unable to perform ROS: Dementia (unable to participate )     Physical Exam Constitutional:      General: She is not in acute distress.    Appearance: She  is well-developed. She is not diaphoretic.  Neck:     Musculoskeletal: Neck supple.     Thyroid: No thyromegaly.  Cardiovascular:     Rate and Rhythm: Normal rate and regular rhythm.  Pulses: Normal pulses.     Heart sounds: Normal heart sounds.  Pulmonary:     Effort: Pulmonary effort is normal. No respiratory distress.     Breath sounds: Normal breath sounds.  Abdominal:     General: Bowel sounds are normal. There is no distension.     Palpations: Abdomen is soft.     Tenderness: There is no abdominal tenderness.  Musculoskeletal:     Right lower leg: No edema.     Left lower leg: No edema.     Comments: Kyphosis Is able to move all extremities      Lymphadenopathy:     Cervical: No cervical adenopathy.  Skin:    General: Skin is warm and dry.  Neurological:     Mental Status: She is alert. Mental status is at baseline.  Psychiatric:        Mood and Affect: Mood normal.      ASSESSMENT/ PLAN:   Patient is being discharged with the following home health services:  Pt/ot/rn/cna: to evaluate and treat as indicated for gait balance strength adl training medication management and adl care.   Patient is being discharged with the following durable medical equipment:  Standard wheelchair with elevated leg rests; cushion; anti-tippers brake extensions to allow her to maintain her current level of independence with her adls which cannot be achieved with a walker.   Patient has been advised to f/u with their PCP in 1-2 weeks to bring them up to date on their rehab stay.  Social services at facility was responsible for arranging this appointment.  Pt was provided with a 30 day supply of prescriptions for medications and refills must be obtained from their PCP.  For controlled substances, a more limited supply may be provided adequate until PCP appointment only.  A 30 day supply of her current medications have been sent to [facility] pharmacy in [place] with #15 ritalin 5 mg tabs.    Time spent with patient: 40 minutes: medications home health needs; dme.    Synthia Innocent NP [facility]  Contact [phone] Monday through Friday 8am- 5pm  After hours call [phone]

## 2019-06-14 ENCOUNTER — Other Ambulatory Visit: Payer: Self-pay

## 2019-06-14 ENCOUNTER — Encounter (HOSPITAL_COMMUNITY): Payer: Self-pay | Admitting: Emergency Medicine

## 2019-06-14 ENCOUNTER — Emergency Department (HOSPITAL_COMMUNITY): Payer: Medicare Other

## 2019-06-14 ENCOUNTER — Inpatient Hospital Stay (HOSPITAL_COMMUNITY)
Admission: EM | Admit: 2019-06-14 | Discharge: 2019-06-17 | DRG: 642 | Disposition: A | Discharge status: Hospice | Payer: Medicare Other | Attending: Internal Medicine | Admitting: Internal Medicine

## 2019-06-14 DIAGNOSIS — F0151 Vascular dementia with behavioral disturbance: Secondary | ICD-10-CM | POA: Diagnosis not present

## 2019-06-14 DIAGNOSIS — Z66 Do not resuscitate: Secondary | ICD-10-CM | POA: Diagnosis present

## 2019-06-14 DIAGNOSIS — N179 Acute kidney failure, unspecified: Secondary | ICD-10-CM | POA: Diagnosis present

## 2019-06-14 DIAGNOSIS — I110 Hypertensive heart disease with heart failure: Secondary | ICD-10-CM | POA: Diagnosis present

## 2019-06-14 DIAGNOSIS — Z87891 Personal history of nicotine dependence: Secondary | ICD-10-CM

## 2019-06-14 DIAGNOSIS — R404 Transient alteration of awareness: Secondary | ICD-10-CM | POA: Diagnosis not present

## 2019-06-14 DIAGNOSIS — Z8249 Family history of ischemic heart disease and other diseases of the circulatory system: Secondary | ICD-10-CM

## 2019-06-14 DIAGNOSIS — R000 Tachycardia, unspecified: Secondary | ICD-10-CM | POA: Diagnosis present

## 2019-06-14 DIAGNOSIS — G9341 Metabolic encephalopathy: Secondary | ICD-10-CM | POA: Diagnosis present

## 2019-06-14 DIAGNOSIS — I5033 Acute on chronic diastolic (congestive) heart failure: Secondary | ICD-10-CM | POA: Diagnosis present

## 2019-06-14 DIAGNOSIS — E876 Hypokalemia: Secondary | ICD-10-CM | POA: Diagnosis not present

## 2019-06-14 DIAGNOSIS — Z1159 Encounter for screening for other viral diseases: Secondary | ICD-10-CM | POA: Diagnosis not present

## 2019-06-14 DIAGNOSIS — I10 Essential (primary) hypertension: Secondary | ICD-10-CM | POA: Diagnosis present

## 2019-06-14 DIAGNOSIS — I5032 Chronic diastolic (congestive) heart failure: Secondary | ICD-10-CM | POA: Diagnosis not present

## 2019-06-14 DIAGNOSIS — E7220 Disorder of urea cycle metabolism, unspecified: Secondary | ICD-10-CM | POA: Diagnosis not present

## 2019-06-14 DIAGNOSIS — D649 Anemia, unspecified: Secondary | ICD-10-CM | POA: Diagnosis present

## 2019-06-14 DIAGNOSIS — F03918 Unspecified dementia, unspecified severity, with other behavioral disturbance: Secondary | ICD-10-CM | POA: Diagnosis present

## 2019-06-14 DIAGNOSIS — R7989 Other specified abnormal findings of blood chemistry: Secondary | ICD-10-CM

## 2019-06-14 DIAGNOSIS — G9340 Encephalopathy, unspecified: Secondary | ICD-10-CM | POA: Diagnosis present

## 2019-06-14 DIAGNOSIS — F0391 Unspecified dementia with behavioral disturbance: Secondary | ICD-10-CM | POA: Diagnosis present

## 2019-06-14 DIAGNOSIS — I714 Abdominal aortic aneurysm, without rupture: Secondary | ICD-10-CM | POA: Diagnosis present

## 2019-06-14 DIAGNOSIS — R1310 Dysphagia, unspecified: Secondary | ICD-10-CM | POA: Diagnosis present

## 2019-06-14 DIAGNOSIS — E785 Hyperlipidemia, unspecified: Secondary | ICD-10-CM | POA: Diagnosis not present

## 2019-06-14 DIAGNOSIS — R21 Rash and other nonspecific skin eruption: Secondary | ICD-10-CM | POA: Diagnosis present

## 2019-06-14 DIAGNOSIS — Z515 Encounter for palliative care: Secondary | ICD-10-CM | POA: Diagnosis present

## 2019-06-14 DIAGNOSIS — K219 Gastro-esophageal reflux disease without esophagitis: Secondary | ICD-10-CM | POA: Diagnosis present

## 2019-06-14 DIAGNOSIS — Z7189 Other specified counseling: Secondary | ICD-10-CM | POA: Diagnosis not present

## 2019-06-14 DIAGNOSIS — R17 Unspecified jaundice: Secondary | ICD-10-CM | POA: Diagnosis present

## 2019-06-14 DIAGNOSIS — R5381 Other malaise: Secondary | ICD-10-CM | POA: Diagnosis present

## 2019-06-14 LAB — COMPREHENSIVE METABOLIC PANEL
ALT: 17 U/L (ref 0–44)
AST: 27 U/L (ref 15–41)
Albumin: 2.5 g/dL — ABNORMAL LOW (ref 3.5–5.0)
Alkaline Phosphatase: 121 U/L (ref 38–126)
Anion gap: 9 (ref 5–15)
BUN: 16 mg/dL (ref 8–23)
CO2: 23 mmol/L (ref 22–32)
Calcium: 8.7 mg/dL — ABNORMAL LOW (ref 8.9–10.3)
Chloride: 108 mmol/L (ref 98–111)
Creatinine, Ser: 1.1 mg/dL — ABNORMAL HIGH (ref 0.44–1.00)
GFR calc Af Amer: 51 mL/min — ABNORMAL LOW (ref >60)
GFR calc non Af Amer: 44 mL/min — ABNORMAL LOW (ref >60)
Glucose, Bld: 102 mg/dL — ABNORMAL HIGH (ref 70–99)
Potassium: 3 mmol/L — ABNORMAL LOW (ref 3.5–5.1)
Sodium: 140 mmol/L (ref 135–145)
Total Bilirubin: 2.1 mg/dL — ABNORMAL HIGH (ref 0.3–1.2)
Total Protein: 6.4 g/dL — ABNORMAL LOW (ref 6.5–8.1)

## 2019-06-14 LAB — URINALYSIS, ROUTINE W REFLEX MICROSCOPIC
Bilirubin Urine: NEGATIVE
Glucose, UA: NEGATIVE mg/dL
Hgb urine dipstick: NEGATIVE
Ketones, ur: NEGATIVE mg/dL
Nitrite: NEGATIVE
Protein, ur: NEGATIVE mg/dL
Specific Gravity, Urine: 1.009 (ref 1.005–1.030)
pH: 6 (ref 5.0–8.0)

## 2019-06-14 LAB — CBC
HCT: 34.1 % — ABNORMAL LOW (ref 36.0–46.0)
Hemoglobin: 11.1 g/dL — ABNORMAL LOW (ref 12.0–15.0)
MCH: 31.9 pg (ref 26.0–34.0)
MCHC: 32.6 g/dL (ref 30.0–36.0)
MCV: 98 fL (ref 80.0–100.0)
Platelets: 215 10*3/uL (ref 150–400)
RBC: 3.48 MIL/uL — ABNORMAL LOW (ref 3.87–5.11)
RDW: 16.8 % — ABNORMAL HIGH (ref 11.5–15.5)
WBC: 6 10*3/uL (ref 4.0–10.5)
nRBC: 0 % (ref 0.0–0.2)

## 2019-06-14 LAB — LACTIC ACID, PLASMA: Lactic Acid, Venous: 2.7 mmol/L (ref 0.5–1.9)

## 2019-06-14 LAB — AMMONIA: Ammonia: 87 umol/L — ABNORMAL HIGH (ref 9–35)

## 2019-06-14 MED ORDER — SODIUM CHLORIDE 0.9% FLUSH: 3.0000 mL | Freq: Once | INTRAVENOUS | Status: DC

## 2019-06-14 MED ORDER — POTASSIUM CHLORIDE 10 MEQ/100ML IV SOLN
10.0000 meq | INTRAVENOUS | Status: AC
  Administered 2019-06-15 (×3): 10 meq via INTRAVENOUS
  Filled 2019-06-14 (×2): qty 100

## 2019-06-14 MED ORDER — LORAZEPAM 2 MG/ML IJ SOLN
0.5000 mg | Freq: Once | INTRAMUSCULAR | Status: AC
  Administered 2019-06-14: 0.5 mg via INTRAVENOUS
  Filled 2019-06-14: qty 1

## 2019-06-14 NOTE — ED Triage Notes (Signed)
Pt son reports that patient has been, lethargic and confused, with increased generalized weakness x 3 days. Son reports elevated ammonia levels a couple weeks ago when admitted.

## 2019-06-14 NOTE — ED Notes (Signed)
Pt son provided copy of POA and Living Will. Placed in medical records

## 2019-06-14 NOTE — ED Notes (Signed)
Pt son going home. Call with any updates.

## 2019-06-14 NOTE — ED Provider Notes (Signed)
[facility]ENC[facility] Provider Note   CSN: [id number] Arrival date & time: 06/14/19  1717    History   Chief Complaint Chief Complaint  Patient presents with  . Altered Mental Status    HPI [name] is a 85 y.o. female.     History was provided by the son.  The patient was [facility] [place] for altered mental status.  Son states that they attributed this to elevated ammonia levels.  She was discharged and sent to rehab for 3 to 4 days, and then sent home subsequently 6 days ago.  Patient's son states that she was back to her baseline for the first 5 days but then today she was altered.  She could not carry on a conversation, was talking incoherently, kept trying to stand up but did not have the strength to get up.  She would urinate on herself and frequently has to go to the bathroom.  Son states that at baseline she is very independent and lives at home alone.  She up until recently was even cooking for herself, but her children decided it would be safer if they cook for her.  Patient son also also     Past Medical History:  Diagnosis Date  . GERD (gastroesophageal reflux disease)   . Hyperlipidemia   . Hypertension   . Neuromuscular disorder [facility](HCC)     Patient Active Problem List   Diagnosis Date Noted  . Hyperammonemia (HCC) 06/15/2019  . AKI (acute kidney injury) (HCC) 06/15/2019  . Hyperbilirubinemia 06/15/2019  . Chronic diastolic (congestive) heart failure (HCC) 05/28/2019  . Hypertensive heart disease with chronic diastolic congestive heart failure (HCC) 05/28/2019  . Dyslipidemia 05/28/2019  . Hypokalemia 05/28/2019  . 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 Altered mental status   . Gastroesophageal reflux disease   . Oropharyngeal dysphagia   . Acute encephalopathy 05/20/2019  . Dementia with behavioral disturbance (HCC) 05/20/2019  . Hemorrhoids  09/27/2017  . Esophageal dysmotility 03/29/2017  . Community acquired pneumonia 03/22/2017  . Aortic heart murmur 07/16/2015  . Essential hypertension 07/16/2015  . Neuropathy 07/16/2015  . Abdominal aortic aneurysm (HCC) 06/17/2012    Past Surgical History:  Procedure Laterality Date  . ABDOMINAL AORTIC ANEURYSM REPAIR    . BREAST BIOPSY    . CHOLECYSTECTOMY    . INGUINAL HERNIA REPAIR    . TOTAL HIP ARTHROPLASTY       OB History   No obstetric history on file.      Home Medications    Prior to Admission medications   Medication Sig Start Date End Date Taking? Authorizing Provider  amLODipine (NORVASC) 5 MG tablet Take 1 tablet (5 mg total) by mouth daily. 06/06/19   Sharee HolsterGreen, Deborah S, NP  calcium carbonate (TUMS - DOSED IN MG ELEMENTAL CALCIUM) 500 MG chewable tablet Chew 1 tablet by mouth at bedtime.    [provider]  furosemide (LASIX) 20 MG tablet Take 1 tablet (20 mg total) by mouth daily as needed for edema. 06/06/19   Sharee HolsterGreen, Deborah S, NP  gabapentin (NEURONTIN) 100 MG capsule Take 1 capsule (100 mg total) by mouth 2 (two) times daily. For Neuropathy 06/06/19   Sharee HolsterGreen, Deborah S, NP  hydrocortisone (ANUSOL-HC) 2.5 % rectal cream Place 1 application rectally 2 (two) times daily. 09/27/17   Anice PaganiniGill, Eric A, NP  methylphenidate (RITALIN) 5 MG tablet Take 0.5 tablets (2.5 mg total) by mouth daily.  06/06/19   Sharee HolsterGreen, Deborah S, NP  NON FORMULARY Diet type:  Dysphagia 2, NAS 05/26/19   [provider]  Nutritional Supplements (ENSURE ENLIVE PO) Take 1 Bottle by mouth 2 (two) times daily between meals. 05/27/19   [provider]  pantoprazole (PROTONIX) 40 MG tablet Take 1 tablet (40 mg total) by mouth daily. 06/06/19   Sharee HolsterGreen, Deborah S, NP  Potassium Chloride ER 20 MEQ TBCR Take 20 mEq by mouth every other day. 06/06/19   Sharee HolsterGreen, Deborah S, NP  risperiDONE (RISPERDAL) 0.25 MG tablet Take 1 tablet (0.25 mg total) by mouth at bedtime. 06/06/19   Sharee HolsterGreen, Deborah S, NP     Family History Family History  Problem Relation Age of Onset  . Heart disease Father     Social History Social History   Tobacco Use  . Smoking status: Former Smoker    Packs/day: 0.50    Types: Cigarettes    Quit date: 11/28/1991    Years since quitting: 27.5  . Smokeless tobacco: Never Used  Substance Use Topics  . Alcohol use: No  . Drug use: No     Allergies   Sulfa antibiotics, Angiotensin receptor blockers, Lisinopril, and Cephalexin   Review of Systems Review of Systems  Psychiatric/Behavioral: Positive for behavioral problems and confusion.  Review of systems limited by patient mental status.     Physical Exam Updated Vital Signs BP (!) 119/57   Pulse 93   Temp 98.9 F (37.2 C) (Oral)   Resp 19   SpO2 97%   Physical Exam Constitutional:      General: She is not in acute distress.    Appearance: She is normal weight.  HENT:     Head: Normocephalic and atraumatic.     Nose: Nose normal. No congestion or rhinorrhea.     Mouth/Throat:     Mouth: Mucous membranes are moist.  Eyes:     Extraocular Movements: Extraocular movements intact.     Conjunctiva/sclera: Conjunctivae normal.  Neck:     Musculoskeletal: Neck supple.  Cardiovascular:     Rate and Rhythm: Normal rate and regular rhythm.     Pulses: Normal pulses.     Heart sounds: Normal heart sounds.  Pulmonary:     Effort: Pulmonary effort is normal.     Breath sounds: Normal breath sounds.  Abdominal:     General: Abdomen is flat.     Palpations: Abdomen is soft.     Tenderness: There is abdominal tenderness.     Comments: Diffuse tenderness.  Greater in the right lower quadrant.   Musculoskeletal:        General: No swelling or tenderness.  Skin:    General: Skin is warm and dry.     Findings: Erythema and rash present.     Comments: Left lower extremity has area of erythema, not warm to touch, nontender, nonswollen.   Neurological:     Mental Status: She is alert. She is  disoriented.     Comments: Patient altered, constantly moving in bed.  Answering inappropriately to questions such as stating 'jesus' when asked what year it was.       ED Treatments / Results  Labs (all labs ordered are listed, but only abnormal results are displayed) Labs Reviewed  COMPREHENSIVE METABOLIC PANEL - Abnormal; Notable for the following components:      Result Value   Potassium 3.0 (*)    Glucose, Bld 102 (*)    Creatinine, Ser 1.10 (*)  Calcium 8.7 (*)    Total Protein 6.4 (*)    Albumin 2.5 (*)    Total Bilirubin 2.1 (*)    GFR calc non Af Amer 44 (*)    GFR calc Af Amer 51 (*)    All other components within normal limits  CBC - Abnormal; Notable for the following components:   RBC 3.48 (*)    Hemoglobin 11.1 (*)    HCT 34.1 (*)    RDW 16.8 (*)    All other components within normal limits  AMMONIA - Abnormal; Notable for the following components:   Ammonia 87 (*)    All other components within normal limits  URINALYSIS, ROUTINE W REFLEX MICROSCOPIC - Abnormal; Notable for the following components:   APPearance HAZY (*)    Leukocytes,Ua TRACE (*)    Bacteria, UA RARE (*)    All other components within normal limits  LACTIC ACID, PLASMA - Abnormal; Notable for the following components:   Lactic Acid, Venous 2.7 (*)    All other components within normal limits  CULTURE, BLOOD (ROUTINE X 2)  CULTURE, BLOOD (ROUTINE X 2)  SARS CORONAVIRUS 2 (HOSPITAL ORDER, [place] [facility])  LACTIC ACID, PLASMA    EKG EKG Interpretation  Date/Time:  Saturday June 14 2019 17:33:05 EDT Ventricular Rate:  105 PR Interval:  162 QRS Duration: 72 QT Interval:  370 QTC Calculation: 489 R Axis:   -39 Text Interpretation:  Undetermined rhythm Left axis deviation Cannot rule out Anterior infarct , age undetermined Abnormal ECG pvcs are new Otherwise no significant change Confirmed by Isla Pence [phone]) on 06/14/2019 9:33:12 PM   Radiology Dg  Chest Portable 1 View  Result Date: 06/14/2019 CLINICAL DATA:  Altered mental status EXAM: PORTABLE CHEST 1 VIEW COMPARISON:  May 20, 2019 FINDINGS: The heart size is stable. Aortic calcifications are noted. The thoracic aorta is likely ectatic. There is no pneumothorax or large pleural effusion. Atelectasis is noted at the lung bases. The lung apices are not well evaluated secondary to overlapping structures. There is mild generalized volume overload. IMPRESSION: Stable cardiac silhouette with mild volume overload. Electronically Signed   By: Constance Holster M.D.   On: 06/14/2019 22:14    Procedures Procedures (including critical care time)  Medications Ordered in ED Medications  sodium chloride flush (NS) 0.9 % injection 3 mL (has no administration in time range)  potassium chloride 10 mEq in 100 mL IVPB (has no administration in time range)  LORazepam (ATIVAN) injection 0.5 mg (0.5 mg Intravenous Given 06/14/19 2254)     Initial Impression / Assessment and Plan / ED Course  I have reviewed the triage vital signs and the nursing notes.  Pertinent labs & imaging results that were available during my care of the patient were reviewed by me and considered in my medical decision making (see chart for details).        Patient is a 85 year old female who was recently discharged from [facility] for altered mental status suspected to be due to elevated ammonia levels.  Patient was back to baseline according to family after her discharge, but earlier today became altered mentally. She hs been confused, and agitated yet lacking the strength get up from her chair.  On admission, the patient is afebrile, and vitals were within normal limits (hr slightly elevated at 103, but has since come down).  Physical exam did not show an obvious source of infection.  Left lower extremity shows an  area of erythema that could be a cellulitis, but is not warm or tender.  No sacral ulcers.  Lungs clear  and CXR was negative for infectious processes and pt has not had diarrhea or vomiting.  Urinalysis did not indicate an infection.  Her ammonia was elevated again at 87.lactic acid was 2.7, but fluids were not given d/t CHF history and mild fluid overload seen on CXR. Blood cultures were drawn and are pending. Right now there is not a clear source of infection, and patient does not appear to be septic.  Elevated ammonia levels may be the most likely cause of her AMS.  Uncertain if the patient was given lactulose at [facility].  Given her altered status, patient was admitted to hospitalist service for further workup and treatment of hepatic encephalopathy.   Final Clinical Impressions(s) / ED Diagnoses   Final diagnoses:  Transient alteration of awareness  Hypokalemia    ED Discharge Orders    None       Sandre Kitty, MD 06/15/19 Barnett Abu, MD 06/15/19 2344

## 2019-06-14 NOTE — ED Notes (Signed)
Portable Xray at bedside.

## 2019-06-15 ENCOUNTER — Observation Stay (HOSPITAL_COMMUNITY): Payer: Medicare Other

## 2019-06-15 ENCOUNTER — Encounter (HOSPITAL_COMMUNITY): Payer: Self-pay | Admitting: Family Medicine

## 2019-06-15 DIAGNOSIS — F0151 Vascular dementia with behavioral disturbance: Secondary | ICD-10-CM

## 2019-06-15 DIAGNOSIS — N179 Acute kidney failure, unspecified: Secondary | ICD-10-CM | POA: Diagnosis present

## 2019-06-15 DIAGNOSIS — G9340 Encephalopathy, unspecified: Secondary | ICD-10-CM

## 2019-06-15 DIAGNOSIS — I10 Essential (primary) hypertension: Secondary | ICD-10-CM

## 2019-06-15 DIAGNOSIS — F0391 Unspecified dementia with behavioral disturbance: Secondary | ICD-10-CM

## 2019-06-15 DIAGNOSIS — I5032 Chronic diastolic (congestive) heart failure: Secondary | ICD-10-CM

## 2019-06-15 DIAGNOSIS — E876 Hypokalemia: Secondary | ICD-10-CM

## 2019-06-15 DIAGNOSIS — I110 Hypertensive heart disease with heart failure: Secondary | ICD-10-CM

## 2019-06-15 DIAGNOSIS — E7220 Disorder of urea cycle metabolism, unspecified: Principal | ICD-10-CM

## 2019-06-15 LAB — COMPREHENSIVE METABOLIC PANEL
ALT: 14 U/L (ref 0–44)
AST: 21 U/L (ref 15–41)
Albumin: 2 g/dL — ABNORMAL LOW (ref 3.5–5.0)
Alkaline Phosphatase: 89 U/L (ref 38–126)
Anion gap: 8 (ref 5–15)
BUN: 19 mg/dL (ref 8–23)
CO2: 24 mmol/L (ref 22–32)
Calcium: 8.1 mg/dL — ABNORMAL LOW (ref 8.9–10.3)
Chloride: 110 mmol/L (ref 98–111)
Creatinine, Ser: 0.97 mg/dL (ref 0.44–1.00)
GFR calc Af Amer: 60 mL/min — ABNORMAL LOW (ref >60)
GFR calc non Af Amer: 51 mL/min — ABNORMAL LOW (ref >60)
Glucose, Bld: 101 mg/dL — ABNORMAL HIGH (ref 70–99)
Potassium: 2.9 mmol/L — ABNORMAL LOW (ref 3.5–5.1)
Sodium: 142 mmol/L (ref 135–145)
Total Bilirubin: 1.8 mg/dL — ABNORMAL HIGH (ref 0.3–1.2)
Total Protein: 4.9 g/dL — ABNORMAL LOW (ref 6.5–8.1)

## 2019-06-15 LAB — CBC WITH DIFFERENTIAL/PLATELET
Abs Immature Granulocytes: 0.01 10*3/uL (ref 0.00–0.07)
Basophils Absolute: 0 10*3/uL (ref 0.0–0.1)
Basophils Relative: 1 %
Eosinophils Absolute: 0.1 10*3/uL (ref 0.0–0.5)
Eosinophils Relative: 2 %
HCT: 28.5 % — ABNORMAL LOW (ref 36.0–46.0)
Hemoglobin: 9.2 g/dL — ABNORMAL LOW (ref 12.0–15.0)
Immature Granulocytes: 0 %
Lymphocytes Relative: 32 %
Lymphs Abs: 1.8 10*3/uL (ref 0.7–4.0)
MCH: 30.7 pg (ref 26.0–34.0)
MCHC: 32.3 g/dL (ref 30.0–36.0)
MCV: 95 fL (ref 80.0–100.0)
Monocytes Absolute: 0.7 10*3/uL (ref 0.1–1.0)
Monocytes Relative: 13 %
Neutro Abs: 2.8 10*3/uL (ref 1.7–7.7)
Neutrophils Relative %: 52 %
Platelets: 159 10*3/uL (ref 150–400)
RBC: 3 MIL/uL — ABNORMAL LOW (ref 3.87–5.11)
RDW: 16.5 % — ABNORMAL HIGH (ref 11.5–15.5)
WBC: 5.5 10*3/uL (ref 4.0–10.5)
nRBC: 0 % (ref 0.0–0.2)

## 2019-06-15 LAB — PROTIME-INR
INR: 1.3 — ABNORMAL HIGH (ref 0.8–1.2)
Prothrombin Time: 15.8 seconds — ABNORMAL HIGH (ref 11.4–15.2)

## 2019-06-15 LAB — SARS CORONAVIRUS 2 BY RT PCR (HOSPITAL ORDER, PERFORMED IN ~~LOC~~ HOSPITAL LAB): SARS Coronavirus 2: NEGATIVE

## 2019-06-15 LAB — LACTIC ACID, PLASMA: Lactic Acid, Venous: 1.7 mmol/L (ref 0.5–1.9)

## 2019-06-15 LAB — BILIRUBIN, FRACTIONATED(TOT/DIR/INDIR)
Bilirubin, Direct: 0.7 mg/dL — ABNORMAL HIGH (ref 0.0–0.2)
Indirect Bilirubin: 1.3 mg/dL — ABNORMAL HIGH (ref 0.3–0.9)
Total Bilirubin: 2 mg/dL — ABNORMAL HIGH (ref 0.3–1.2)

## 2019-06-15 LAB — AMMONIA: Ammonia: 89 umol/L — ABNORMAL HIGH (ref 9–35)

## 2019-06-15 LAB — MAGNESIUM: Magnesium: 2 mg/dL (ref 1.7–2.4)

## 2019-06-15 MED ORDER — SODIUM CHLORIDE 0.9% FLUSH
3.0000 mL | Freq: Two times a day (BID) | INTRAVENOUS | Status: DC
  Administered 2019-06-15: 3 mL via INTRAVENOUS

## 2019-06-15 MED ORDER — ONDANSETRON HCL 4 MG PO TABS: 4.0000 mg | ORAL_TABLET | Freq: Four times a day (QID) | ORAL | Status: DC | PRN

## 2019-06-15 MED ORDER — LACTULOSE ENEMA
300.0000 mL | Freq: Once | ORAL | Status: AC
  Administered 2019-06-15: 300 mL via RECTAL
  Filled 2019-06-15: qty 300

## 2019-06-15 MED ORDER — HEPARIN SODIUM (PORCINE) 5000 UNIT/ML IJ SOLN
5000.0000 [IU] | Freq: Three times a day (TID) | INTRAMUSCULAR | Status: DC
  Administered 2019-06-15 – 2019-06-16 (×4): 5000 [IU] via SUBCUTANEOUS
  Filled 2019-06-15 (×4): qty 1

## 2019-06-15 MED ORDER — POTASSIUM CHLORIDE 10 MEQ/100ML IV SOLN
10.0000 meq | INTRAVENOUS | Status: AC
  Administered 2019-06-15 (×6): 10 meq via INTRAVENOUS
  Filled 2019-06-15 (×6): qty 100

## 2019-06-15 MED ORDER — FUROSEMIDE 10 MG/ML IJ SOLN
40.0000 mg | Freq: Once | INTRAMUSCULAR | Status: AC
  Administered 2019-06-15: 40 mg via INTRAVENOUS
  Filled 2019-06-15: qty 4

## 2019-06-15 MED ORDER — ONDANSETRON HCL 4 MG/2ML IJ SOLN: 4.0000 mg | Freq: Four times a day (QID) | INTRAMUSCULAR | Status: DC | PRN

## 2019-06-15 MED ORDER — SODIUM CHLORIDE 0.9 % IV SOLN: 250.0000 mL | INTRAVENOUS | Status: DC | PRN

## 2019-06-15 MED ORDER — SODIUM CHLORIDE 0.9% FLUSH: 3.0000 mL | INTRAVENOUS | Status: DC | PRN

## 2019-06-15 MED ORDER — SODIUM CHLORIDE 0.9% FLUSH
3.0000 mL | Freq: Two times a day (BID) | INTRAVENOUS | Status: DC
  Administered 2019-06-15 (×2): 3 mL via INTRAVENOUS

## 2019-06-15 MED ORDER — LACTULOSE 10 GM/15ML PO SOLN
20.0000 g | Freq: Three times a day (TID) | ORAL | Status: DC
  Administered 2019-06-15 – 2019-06-16 (×4): 20 g via ORAL
  Filled 2019-06-15 (×4): qty 30

## 2019-06-15 NOTE — ED Notes (Signed)
ED TO INPATIENT HANDOFF REPORT  ED Nurse Name and Phone #: Delorise Jackson [phone]  S Name/Age/Gender [name] 85 y.o. female Room/Bed: 031C/031C  Code Status   Code Status: Prior  Home/SNF/Other Home Patient oriented to: self Is this baseline? Yes   Triage Complete: Triage complete  Chief Complaint confusion  Triage Note Pt son reports that patient has been, lethargic and confused, with increased generalized weakness x 3 days. Son reports elevated ammonia levels a couple weeks ago when admitted.    Allergies Allergies  Allergen Reactions  . Sulfa Antibiotics Anaphylaxis  . Angiotensin Receptor Blockers     6/23-6/29/2020 clinical glossal edema and mild angioedema on lisinopril while hospitalized for encephalopathy  . Lisinopril     6/23-6/29/2020 clinical glossal edema and mild angioedema  . Cephalexin Itching and Rash    Level of Care/Admitting Diagnosis ED Disposition    ED Disposition Condition Comment   Admit  Hospital Area: [facility] [[id number]]  Level of Care: Telemetry Medical [104]  I expect the patient will be discharged within 24 hours: Yes  LOW acuity---Tx typically complete <24 hrs---ACUTE conditions typically can be evaluated <24 hours---LABS likely to return to acceptable levels <24 hours---IS near functional baseline---EXPECTED to return to current living arrangement---NOT newly hypoxic: Does not meet criteria for 5C-Observation unit  Covid Evaluation: Asymptomatic Screening Protocol (No Symptoms)  Diagnosis: Acute encephalopathy [202334]  Admitting Physician: Briscoe Deutscher [[id number]]  Attending Physician: Briscoe Deutscher [[id number]]  PT Class (Do Not Modify): Observation [104]  PT Acc Code (Do Not Modify): Observation [10022]       B Medical/Surgery History Past Medical History:  Diagnosis Date  . GERD (gastroesophageal reflux disease)   . Hyperlipidemia   . Hypertension   . Neuromuscular disorder [facility])    Past Surgical History:   Procedure Laterality Date  . ABDOMINAL AORTIC ANEURYSM REPAIR    . BREAST BIOPSY    . CHOLECYSTECTOMY    . INGUINAL HERNIA REPAIR    . TOTAL HIP ARTHROPLASTY       A IV Location/Drains/Wounds Patient Lines/Drains/Airways Status   Active Line/Drains/Airways    None          Intake/Output Last 24 hours No intake or output data in the 24 hours ending 06/15/19 0039  Labs/Imaging Results for orders placed or performed during the hospital encounter of 06/14/19 (from the past 48 hour(s))  Comprehensive metabolic panel     Status: Abnormal   Collection Time: 06/14/19  5:31 PM  Result Value Ref Range   Sodium 140 135 - 145 mmol/L   Potassium 3.0 (L) 3.5 - 5.1 mmol/L   Chloride 108 98 - 111 mmol/L   CO2 23 22 - 32 mmol/L   Glucose, Bld 102 (H) 70 - 99 mg/dL   BUN 16 8 - 23 mg/dL   Creatinine, Ser 1.11 (H) 0.44 - 1.00 mg/dL   Calcium 8.7 (L) 8.9 - 10.3 mg/dL   Total Protein 6.4 (L) 6.5 - 8.1 g/dL   Albumin 2.5 (L) 3.5 - 5.0 g/dL   AST 27 15 - 41 U/L   ALT 17 0 - 44 U/L   Alkaline Phosphatase 121 38 - 126 U/L   Total Bilirubin 2.1 (H) 0.3 - 1.2 mg/dL   GFR calc non Af Amer 44 (L) >60 mL/min   GFR calc Af Amer 51 (L) >60 mL/min   Anion gap 9 5 - 15    Comment: Performed at [facility], [address] [place], [place] [address]  CBC     Status: Abnormal   Collection Time: 06/14/19  5:31 PM  Result Value Ref Range   WBC 6.0 4.0 - 10.5 K/uL   RBC 3.48 (L) 3.87 - 5.11 MIL/uL   Hemoglobin 11.1 (L) 12.0 - 15.0 g/dL   HCT 19.134.1 (L) 47.836.0 - 29.546.0 %   MCV 98.0 80.0 - 100.0 fL   MCH 31.9 26.0 - 34.0 pg   MCHC 32.6 30.0 - 36.0 g/dL   RDW 62.116.8 (H) 30.811.5 - 65.715.5 %   Platelets 215 150 - 400 K/uL   nRBC 0.0 0.0 - 0.2 %    Comment: Performed at [facility], [address] [place], [place] [address]  Ammonia     Status: Abnormal   Collection Time: 06/14/19  5:31 PM  Result Value Ref Range   Ammonia 87 (H) 9 - 35 umol/L    Comment: Performed at [facility],  [address] [place], [place] [address]  Urinalysis, Routine w reflex microscopic     Status: Abnormal   Collection Time: 06/14/19 10:00 PM  Result Value Ref Range   Color, Urine YELLOW YELLOW   APPearance HAZY (A) CLEAR   Specific Gravity, Urine 1.009 1.005 - 1.030   pH 6.0 5.0 - 8.0   Glucose, UA NEGATIVE NEGATIVE mg/dL   Hgb urine dipstick NEGATIVE NEGATIVE   Bilirubin Urine NEGATIVE NEGATIVE   Ketones, ur NEGATIVE NEGATIVE mg/dL   Protein, ur NEGATIVE NEGATIVE mg/dL   Nitrite NEGATIVE NEGATIVE   Leukocytes,Ua TRACE (A) NEGATIVE   RBC / HPF 0-5 0 - 5 RBC/hpf   WBC, UA 6-10 0 - 5 WBC/hpf   Bacteria, UA RARE (A) NONE SEEN   Squamous Epithelial / LPF 0-5 0 - 5   Mucus PRESENT     Comment: Performed at [facility], [address] [place], [place] [address]  Lactic acid, plasma     Status: Abnormal   Collection Time: 06/14/19 10:00 PM  Result Value Ref Range   Lactic Acid, Venous 2.7 (HH) 0.5 - 1.9 mmol/L    Comment: CRITICAL RESULT CALLED TO, READ BACK BY AND VERIFIED WITH: PHILLIPS T,RN 06/14/19 2233 WAYK Performed at [facility], [address] [place], [place] [address]    Dg Chest Portable 1 View  Result Date: 06/14/2019 CLINICAL DATA:  Altered mental status EXAM: PORTABLE CHEST 1 VIEW COMPARISON:  May 20, 2019 FINDINGS: The heart size is stable. Aortic calcifications are noted. The thoracic aorta is likely ectatic. There is no pneumothorax or large pleural effusion. Atelectasis is noted at the lung bases. The lung apices are not well evaluated secondary to overlapping structures. There is mild generalized volume overload. IMPRESSION: Stable cardiac silhouette with mild volume overload. Electronically Signed   By: Katherine Mantlehristopher  Green M.D.   On: 06/14/2019 22:14    Pending Labs Unresulted Labs (From admission, onward)    Start     Ordered   06/15/19 0500  Ammonia  Tomorrow morning,   R     06/15/19 0032   06/15/19 0500  Comprehensive metabolic panel  Tomorrow  morning,   R     06/15/19 0032   06/15/19 0033  Bilirubin, fractionated(tot/dir/indir)  Add-on,   AD     06/15/19 0032   06/14/19 2326  SARS Coronavirus 2 (CEPHEID - Performed in [facility]), [facility] Order  Once,   STAT    Question:  Rule Out  Answer:  Yes   06/14/19 2326   06/14/19 2137  Blood culture (routine x 2)  BLOOD CULTURE X 2,   STAT     06/14/19 2137   06/14/19 2136  Lactic acid, plasma  Now then every 2 hours,   STAT     06/14/19 2137          Vitals/Pain Today's Vitals   06/14/19 2230 06/14/19 2245 06/14/19 2300 06/14/19 2330  BP: 118/60 (!) 121/59 (!) 108/49 (!) 119/57  Pulse: 94 93 87 93  Resp: 14 20 20 19   Temp:      TempSrc:      SpO2: 99% 94% 100% 97%  PainSc:        Isolation Precautions No active isolations  Medications Medications  sodium chloride flush (NS) 0.9 % injection 3 mL (has no administration in time range)  potassium chloride 10 mEq in 100 mL IVPB (has no administration in time range)  furosemide (LASIX) injection 40 mg (has no administration in time range)  lactulose (CHRONULAC) enema 200 gm (has no administration in time range)  LORazepam (ATIVAN) injection 0.5 mg (0.5 mg Intravenous Given 06/14/19 2254)    Mobility walks with device High fall risk   Focused Assessments Neuro Assessment Handoff:       Last date known well: 06/11/19   Neuro Assessment:   Neuro Checks:      Last Documented NIHSS Modified Score:   Has TPA been given? No If patient is a Neuro Trauma and patient is going to OR before floor call report to [place] nurse: [phone] or [phone]     R Recommendations: See Admitting Provider Note  Report given to:   Additional Notes: Call pt son with any updates please.

## 2019-06-15 NOTE — Progress Notes (Signed)
[name]  (85 yrs old female very confused pulling off telemetry leads and trying to get out of bed. and came in with AMS and demential with increased ammonia level incontinent of bowl and bladder. Pt  Oriented to self and does not follow command ., Reoriented and redirected to person and place vs done    Peri care done and made comfortable in bed.  Low bed and floor mat in use all safety measures in place  Will continue to monitor pt,  spent a total of 30 min with this pt  on my initial encounter.. Pt  a total care and difficult to redirect.

## 2019-06-15 NOTE — Evaluation (Signed)
Clinical/Bedside Swallow Evaluation Patient Details  Name: [name] MRN: [id number] Date of Birth: 04-15-1927  Today's Date: 06/15/2019 Time: SLP Start Time (ACUTE ONLY): 1157 SLP Stop Time (ACUTE ONLY): 1215 SLP Time Calculation (min) (ACUTE ONLY): 18 min  Past Medical History:  Past Medical History:  Diagnosis Date  . GERD (gastroesophageal reflux disease)   . Hyperlipidemia   . Hypertension   . Neuromuscular disorder [facility])    Past Surgical History:  Past Surgical History:  Procedure Laterality Date  . ABDOMINAL AORTIC ANEURYSM REPAIR    . BREAST BIOPSY    . CHOLECYSTECTOMY    . INGUINAL HERNIA REPAIR    . TOTAL HIP ARTHROPLASTY     HPI:  85 y.o. female with medical history significant for hypertension, chronic diastolic CHF, dementia with behavioral disturbance, and AAA, now presenting to the emergency department for evaluation of lethargy and confusion.  Patient is unable to contribute to the history due to her clinical condition.  Her son reports that she had been doing fairly well when she initially returned home from an SNF a week or so ago, but has now become lethargic, confused, and no longer able to carry on a conversation.  CXR on 06/14/19 indicated mild volume overload; MRI negative for acute infarct on 06/14/19.  Assessment / Plan / Recommendation Clinical Impression   Pt with oropharyngeal dysphagia likely d/t cognitive impairment/lethargy as pt held boluses in oral cavity with mod verbal cues to clear oral cavity required, suspected delay in the initiation of the swallow (presbyphagia contributory), audible swallow and delayed throat clearing/cough after intake of puree/larger amounts of liquids; smaller sips did not elicit delayed cough; solids not attempted d/t pt's mentation/lethargy; recommend initiating a Dysphagia 1/thin liquid diet with full supervision/aspiration and swallowing precautions in place with pt consuming POs when alert/awake only; ST will f/u while  in acute setting for diet tolerance and advancement; thank you for this consult. SLP Visit Diagnosis: Dysphagia, oropharyngeal phase (R13.12)    Aspiration Risk  Mild aspiration risk;Moderate aspiration risk    Diet Recommendation   Dysphagia 1(puree)/thin liquids  Medication Administration: Whole meds with puree    Other  Recommendations Oral Care Recommendations: Oral care BID;Staff/trained caregiver to provide oral care Other Recommendations: Clarify dietary restrictions   Follow up Recommendations 24 hour supervision/assistance;Skilled Nursing facility      Frequency and Duration min 2x/week  1 week       Prognosis Prognosis for Safe Diet Advancement: Fair Barriers to Reach Goals: Cognitive deficits;Behavior      Swallow Study   General Date of Onset: 06/14/19 HPI: 85 y.o. female with medical history significant for hypertension, chronic diastolic CHF, dementia with behavioral disturbance, and AAA, now presenting to the emergency department for evaluation of lethargy and confusion.  Patient is unable to contribute to the history due to her clinical condition.  Her son reports that she had been doing fairly well when she initially returned home from an SNF a week or so ago, but has now become lethargic, confused, and no longer able to carry on a conversation.  Type of Study: Bedside Swallow Evaluation Previous Swallow Assessment: (05/26/19; D3/thin recommended) Diet Prior to this Study: NPO Temperature Spikes Noted: No Respiratory Status: Room air History of Recent Intubation: No Behavior/Cognition: Lethargic/Drowsy;Impulsive;Requires cueing Oral Cavity Assessment: Dry Oral Care Completed by SLP: Yes Oral Cavity - Dentition: Adequate natural dentition;Missing dentition Self-Feeding Abilities: Needs assist Patient Positioning: Upright in bed Baseline Vocal Quality: Normal Volitional Cough: Cognitively unable to elicit  Volitional Swallow: Unable to elicit     Oral/Motor/Sensory Function Overall Oral Motor/Sensory Function: Generalized oral weakness   Ice Chips Ice chips: Within functional limits Presentation: Spoon   Thin Liquid Thin Liquid: Impaired Presentation: Spoon;Straw Pharyngeal  Phase Impairments: Multiple swallows;Other (comments);Suspected delayed Swallow;Throat Clearing - Delayed(audible swallow)    Nectar Thick Nectar Thick Liquid: Not tested   Honey Thick Honey Thick Liquid: Not tested   Puree Puree: Impaired Presentation: Spoon Oral Phase Impairments: Reduced lingual movement/coordination Oral Phase Functional Implications: Oral holding;Prolonged oral transit Pharyngeal Phase Impairments: Suspected delayed Swallow;Multiple swallows   Solid     Solid: Not tested Other Comments: (lethargic)      Tressie Stalker, M.S., CCC-SLP 06/15/2019,1:23 PM

## 2019-06-15 NOTE — H&P (Signed)
History and Physical    Kellie NearingFrances I Young ZOX:[id number] DOB: 1927-08-02 DOA: 06/14/2019  PCP: Elfredia NevinsFusco, Lawrence, MD   Patient coming from: Home   Chief Complaint: Lethargy, confusion   HPI: [name] is a 85 y.o. female with medical history significant for hypertension, chronic diastolic CHF, dementia with behavioral disturbance, and AAA, now presenting to the emergency department for evaluation of lethargy and confusion.  Patient is unable to contribute to the history due to her clinical condition.  Her son reports that she had been doing fairly well when she initially returned home from an SNF a week or so ago, but has now become lethargic, confused, and no longer able to carry on a conversation.  No fall or trauma reported.  Patient was not making any specific complaints.  She had been admitted to the hospital last month under very similar circumstances, was found to have elevated ammonia, dehydration, and acute kidney injury, was hydrated, improved, and was discharged to an SNF.  She had normal TSH and B12 levels during the recent hospitalization and RPR was nonreactive.  ED Course: Upon arrival to the ED, patient is found to be afebrile, saturating upper 90s on room air, and with normal respirations, heart rate, and stable blood pressure.  EKG features sinus tachycardia with rate 105 and left axis deviation.  Chest x-ray reveals stable cardiac silhouette with mild volume overload.  Chemistry panel notable for potassium 3.0, bilirubin 2.1, and creatinine 1.10, up from 0.65 earlier this month.  CBC notable for mild normocytic anemia.  Ammonia is elevated to 87.  Lactic acid elevated to 2.7.  Urinalysis with rare bacteria, trace leukocytes, and 6-10 WBC.  Blood cultures were collected, COVID-19 screening is in process, patient was treated with Ativan and IV potassium, and hospitalist consulted for admission.  Review of Systems:  Unable to complete ROS secondary to the patient's clinical condition.   Past Medical History:  Diagnosis Date  . GERD (gastroesophageal reflux disease)   . Hyperlipidemia   . Hypertension   . Neuromuscular disorder [facility](HCC)     Past Surgical History:  Procedure Laterality Date  . ABDOMINAL AORTIC ANEURYSM REPAIR    . BREAST BIOPSY    . CHOLECYSTECTOMY    . INGUINAL HERNIA REPAIR    . TOTAL HIP ARTHROPLASTY       reports that she quit smoking about 27 years ago. Her smoking use included cigarettes. She smoked 0.50 packs per day. She has never used smokeless tobacco. She reports that she does not drink alcohol or use drugs.  Allergies  Allergen Reactions  . Sulfa Antibiotics Anaphylaxis  . Angiotensin Receptor Blockers     6/23-6/29/2020 clinical glossal edema and mild angioedema on lisinopril while hospitalized for encephalopathy  . Lisinopril     6/23-6/29/2020 clinical glossal edema and mild angioedema  . Cephalexin Itching and Rash    Family History  Problem Relation Age of Onset  . Heart disease Father      Prior to Admission medications   Medication Sig Start Date End Date Taking? Authorizing Provider  amLODipine (NORVASC) 5 MG tablet Take 1 tablet (5 mg total) by mouth daily. 06/06/19   Sharee HolsterGreen, Deborah S, NP  calcium carbonate (TUMS - DOSED IN MG ELEMENTAL CALCIUM) 500 MG chewable tablet Chew 1 tablet by mouth at bedtime.    [provider]  furosemide (LASIX) 20 MG tablet Take 1 tablet (20 mg total) by mouth daily as needed for edema. 06/06/19   Sharee HolsterGreen, Deborah S, NP  gabapentin (NEURONTIN) 100 MG capsule Take 1 capsule (100 mg total) by mouth 2 (two) times daily. For Neuropathy 06/06/19   Gerlene Fee, NP  hydrocortisone (ANUSOL-HC) 2.5 % rectal cream Place 1 application rectally 2 (two) times daily. 09/27/17   Carlis Stable, NP  methylphenidate (RITALIN) 5 MG tablet Take 0.5 tablets (2.5 mg total) by mouth daily. 06/06/19   Gerlene Fee, NP  NON FORMULARY Diet type:  Dysphagia 2, NAS 05/26/19   [provider]   Nutritional Supplements (ENSURE ENLIVE PO) Take 1 Bottle by mouth 2 (two) times daily between meals. 05/27/19   [provider]  pantoprazole (PROTONIX) 40 MG tablet Take 1 tablet (40 mg total) by mouth daily. 06/06/19   Gerlene Fee, NP  Potassium Chloride ER 20 MEQ TBCR Take 20 mEq by mouth every other day. 06/06/19   Gerlene Fee, NP  risperiDONE (RISPERDAL) 0.25 MG tablet Take 1 tablet (0.25 mg total) by mouth at bedtime. 06/06/19   Gerlene Fee, NP    Physical Exam: Vitals:   06/14/19 2230 06/14/19 2245 06/14/19 2300 06/14/19 2330  BP: 118/60 (!) 121/59 (!) 108/49 (!) 119/57  Pulse: 94 93 87 93  Resp: 14 20 20 19   Temp:      TempSrc:      SpO2: 99% 94% 100% 97%    Constitutional: NAD, somnolent  Eyes: PERTLA, lids and conjunctivae normal ENMT: Mucous membranes are moist. Posterior pharynx clear of any exudate or lesions.   Neck: normal, supple, no masses, no thyromegaly Respiratory:  no wheezing, no crackles. Normal respiratory effort. No accessory muscle use.  Cardiovascular: S1 & S2 heard, regular rate and rhythm. Bilateral leg edema. Abdomen: No distension, no tenderness, soft. Bowel sounds normal.  Musculoskeletal: no clubbing / cyanosis. No joint deformity upper and lower extremities.   Skin: no significant rashes, lesions, ulcers. Warm, dry, well-perfused. Neurologic: no facial asymmetry. Sensation intact. Moving all extremities spontaneously. Somnolent, wakes briefly but not answering questions or following commands.    Labs on Admission: I have personally reviewed following labs and imaging studies  CBC: Recent Labs  Lab 06/14/19 1731  WBC 6.0  HGB 11.1*  HCT 34.1*  MCV 98.0  PLT 573   Basic Metabolic Panel: Recent Labs  Lab 06/14/19 1731  NA 140  K 3.0*  CL 108  CO2 23  GLUCOSE 102*  BUN 16  CREATININE 1.10*  CALCIUM 8.7*   GFR: Estimated Creatinine Clearance: 35.5 mL/min (A) (by C-G formula based on SCr of 1.1 mg/dL (H)). Liver  Function Tests: Recent Labs  Lab 06/14/19 1731  AST 27  ALT 17  ALKPHOS 121  BILITOT 2.1*  PROT 6.4*  ALBUMIN 2.5*   No results for input(s): LIPASE, AMYLASE in the last 168 hours. Recent Labs  Lab 06/14/19 1731  AMMONIA 87*   Coagulation Profile: No results for input(s): INR, PROTIME in the last 168 hours. Cardiac Enzymes: No results for input(s): CKTOTAL, CKMB, CKMBINDEX, TROPONINI in the last 168 hours. BNP (last 3 results) No results for input(s): PROBNP in the last 8760 hours. HbA1C: No results for input(s): HGBA1C in the last 72 hours. CBG: No results for input(s): GLUCAP in the last 168 hours. Lipid Profile: No results for input(s): CHOL, HDL, LDLCALC, TRIG, CHOLHDL, LDLDIRECT in the last 72 hours. Thyroid Function Tests: No results for input(s): TSH, T4TOTAL, FREET4, T3FREE, THYROIDAB in the last 72 hours. Anemia Panel: No results for input(s): VITAMINB12, FOLATE, FERRITIN, TIBC, IRON, RETICCTPCT in  the last 72 hours. Urine analysis:    Component Value Date/Time   COLORURINE YELLOW 06/14/2019 2200   APPEARANCEUR HAZY (A) 06/14/2019 2200   LABSPEC 1.009 06/14/2019 2200   PHURINE 6.0 06/14/2019 2200   GLUCOSEU NEGATIVE 06/14/2019 2200   HGBUR NEGATIVE 06/14/2019 2200   BILIRUBINUR NEGATIVE 06/14/2019 2200   KETONESUR NEGATIVE 06/14/2019 2200   PROTEINUR NEGATIVE 06/14/2019 2200   UROBILINOGEN 2.0 (H) 07/19/2015 1045   NITRITE NEGATIVE 06/14/2019 2200   LEUKOCYTESUR TRACE (A) 06/14/2019 2200   Sepsis Labs: @LABRCNTIP (procalcitonin:4,lacticidven:4) )No results found for this or any previous visit (from the past 240 hour(s)).   Radiological Exams on Admission: Dg Chest Portable 1 View  Result Date: 06/14/2019 CLINICAL DATA:  Altered mental status EXAM: PORTABLE CHEST 1 VIEW COMPARISON:  May 20, 2019 FINDINGS: The heart size is stable. Aortic calcifications are noted. The thoracic aorta is likely ectatic. There is no pneumothorax or large pleural effusion.  Atelectasis is noted at the lung bases. The lung apices are not well evaluated secondary to overlapping structures. There is mild generalized volume overload. IMPRESSION: Stable cardiac silhouette with mild volume overload. Electronically Signed   By: Katherine Mantle M.D.   On: 06/14/2019 22:14    EKG: Independently reviewed. Sinus tachycardia (rate 105), LAD.   Assessment/Plan   1. Acute encephalopathy; hyperammonemia   - Presents with lethargy and confusion, found to have elevated ammonia level, mild volume o/l, mild AKI, and hypokalemia  - She was trying to get OOB in ED, given Ativan, too drowsy for oral medications at time of admission  - Normal TSH and B12, and non-reactive RPR last month; no recent fall/trauma; no infectious s/s  - Address ammonia with potassium-replacement and lactulose, continue supportive care, frequent reorientation    2. Acute kidney injury  - SCr is 1.10 in ED, up from 0.65 earlier this month  - Appeared hypervolemic and given a dose of Lasix 40 mg IV   - Renally-dose medications, repeat chem panel in am   3. Acute on chronic diastolic CHF  - CXR suggests hypervolemia, LE edema noted, not in respiratory distress  - Takes Lasix prn only at home  - Lasix 40 mg IV given in ED, will follow I/O's and daily wt     4. Dementia with behavioral disturbance  - On Risperdal and Ritalin at home  - Hold oral medications until more alert   5. Hypertension   - BP low-normal in ED and antihypertensives held initially    6. AAA  - 4.1 cm AAA on recent CT, continue outpatient follow-up   7. Hypokalemia  - Replaced in ED with 30 mEq IV potassium, will repeat chem panel in am     PPE: Mask, face shield  DVT prophylaxis: sq heparin  Code Status: Full, son dropped off form expressing these wishes  Family Communication: Discussed with patient  Consults called: none  Admission status: Observation     Briscoe Deutscher, MD [facility] Pager [phone]   If 7PM-7AM, please contact night-coverage [email or web address] Password [email or web address]  06/15/2019, 12:21 AM

## 2019-06-15 NOTE — Progress Notes (Signed)
  Patient is a 85 year old female with history of hypertension, chronic diastolic CHF, dementia, aortic aneurysm who presents to the emergency department with complaints of lethargy, confusion.  She just returned from a skilled nursing facility to home.  She was admitted to the hospital last month with similar circumstances, was found to have elevated ammonia level and acute kidney injury. Ammonia level found to be elevated at 87, lactic acid was found to be 2.7. Patient seen and examined the bedside this morning.  Currently hemodynamically stable.  Very sleepy, somnolent.  But awakes when calling her name or shaking.  We will continue lactulose for now.  We will continue to monitor mental status.  If she is unable to drink lactulose, will try rectal lactulose or insert NG tube for oral lactulose. Her liver enzymes are normal.  Lactic acid has normalized this morning. Regarding elevated ammonia level, etiology is unclear at this point.  Will get ultrasound of the liver.  We will also check INR. Patient seen by Dr. Myna Hidalgo this morning.

## 2019-06-16 DIAGNOSIS — N179 Acute kidney failure, unspecified: Secondary | ICD-10-CM | POA: Diagnosis not present

## 2019-06-16 DIAGNOSIS — Z66 Do not resuscitate: Secondary | ICD-10-CM

## 2019-06-16 DIAGNOSIS — R1310 Dysphagia, unspecified: Secondary | ICD-10-CM | POA: Diagnosis present

## 2019-06-16 DIAGNOSIS — K219 Gastro-esophageal reflux disease without esophagitis: Secondary | ICD-10-CM | POA: Diagnosis present

## 2019-06-16 DIAGNOSIS — R17 Unspecified jaundice: Secondary | ICD-10-CM | POA: Diagnosis present

## 2019-06-16 DIAGNOSIS — I5033 Acute on chronic diastolic (congestive) heart failure: Secondary | ICD-10-CM | POA: Diagnosis present

## 2019-06-16 DIAGNOSIS — R000 Tachycardia, unspecified: Secondary | ICD-10-CM | POA: Diagnosis present

## 2019-06-16 DIAGNOSIS — G9340 Encephalopathy, unspecified: Secondary | ICD-10-CM | POA: Diagnosis not present

## 2019-06-16 DIAGNOSIS — E876 Hypokalemia: Secondary | ICD-10-CM | POA: Diagnosis present

## 2019-06-16 DIAGNOSIS — Z7189 Other specified counseling: Secondary | ICD-10-CM

## 2019-06-16 DIAGNOSIS — Z8249 Family history of ischemic heart disease and other diseases of the circulatory system: Secondary | ICD-10-CM | POA: Diagnosis not present

## 2019-06-16 DIAGNOSIS — Z87891 Personal history of nicotine dependence: Secondary | ICD-10-CM | POA: Diagnosis not present

## 2019-06-16 DIAGNOSIS — E785 Hyperlipidemia, unspecified: Secondary | ICD-10-CM | POA: Diagnosis present

## 2019-06-16 DIAGNOSIS — I714 Abdominal aortic aneurysm, without rupture: Secondary | ICD-10-CM | POA: Diagnosis present

## 2019-06-16 DIAGNOSIS — G9341 Metabolic encephalopathy: Secondary | ICD-10-CM | POA: Diagnosis present

## 2019-06-16 DIAGNOSIS — Z515 Encounter for palliative care: Secondary | ICD-10-CM | POA: Diagnosis present

## 2019-06-16 DIAGNOSIS — R404 Transient alteration of awareness: Secondary | ICD-10-CM | POA: Diagnosis present

## 2019-06-16 DIAGNOSIS — F0391 Unspecified dementia with behavioral disturbance: Secondary | ICD-10-CM | POA: Diagnosis present

## 2019-06-16 DIAGNOSIS — R21 Rash and other nonspecific skin eruption: Secondary | ICD-10-CM | POA: Diagnosis present

## 2019-06-16 DIAGNOSIS — D649 Anemia, unspecified: Secondary | ICD-10-CM | POA: Diagnosis present

## 2019-06-16 DIAGNOSIS — Z1159 Encounter for screening for other viral diseases: Secondary | ICD-10-CM | POA: Diagnosis not present

## 2019-06-16 DIAGNOSIS — R5381 Other malaise: Secondary | ICD-10-CM | POA: Diagnosis present

## 2019-06-16 DIAGNOSIS — I110 Hypertensive heart disease with heart failure: Secondary | ICD-10-CM | POA: Diagnosis present

## 2019-06-16 DIAGNOSIS — F0151 Vascular dementia with behavioral disturbance: Secondary | ICD-10-CM | POA: Diagnosis not present

## 2019-06-16 DIAGNOSIS — E7220 Disorder of urea cycle metabolism, unspecified: Secondary | ICD-10-CM | POA: Diagnosis not present

## 2019-06-16 LAB — BASIC METABOLIC PANEL
Anion gap: 11 (ref 5–15)
BUN: 20 mg/dL (ref 8–23)
CO2: 21 mmol/L — ABNORMAL LOW (ref 22–32)
Calcium: 8.6 mg/dL — ABNORMAL LOW (ref 8.9–10.3)
Chloride: 110 mmol/L (ref 98–111)
Creatinine, Ser: 0.8 mg/dL (ref 0.44–1.00)
GFR calc Af Amer: >60 mL/min (ref >60)
GFR calc non Af Amer: >60 mL/min (ref >60)
Glucose, Bld: 98 mg/dL (ref 70–99)
Potassium: 3.5 mmol/L (ref 3.5–5.1)
Sodium: 142 mmol/L (ref 135–145)

## 2019-06-16 LAB — AMMONIA: Ammonia: 32 umol/L (ref 9–35)

## 2019-06-16 MED ORDER — BIOTENE DRY MOUTH MT LIQD: 15.0000 mL | OROMUCOSAL | Status: DC | PRN

## 2019-06-16 MED ORDER — METHYLPHENIDATE HCL 5 MG PO TABS
2.5000 mg | ORAL_TABLET | Freq: Every day | ORAL | Status: DC
  Administered 2019-06-16 – 2019-06-17 (×2): 2.5 mg via ORAL
  Filled 2019-06-16 (×2): qty 1

## 2019-06-16 MED ORDER — LACTULOSE 10 GM/15ML PO SOLN: 10.0000 g | Freq: Three times a day (TID) | ORAL | Status: DC

## 2019-06-16 MED ORDER — MORPHINE SULFATE (CONCENTRATE) 10 MG/0.5ML PO SOLN: 5.0000 mg | ORAL | Status: DC | PRN

## 2019-06-16 MED ORDER — MORPHINE SULFATE (PF) 2 MG/ML IV SOLN: 1.0000 mg | INTRAVENOUS | Status: DC | PRN

## 2019-06-16 MED ORDER — RISPERIDONE 0.5 MG PO TABS
0.2500 mg | ORAL_TABLET | Freq: Every day | ORAL | Status: DC
  Administered 2019-06-17: 0.25 mg via ORAL
  Filled 2019-06-16: qty 1

## 2019-06-16 MED ORDER — DIPHENHYDRAMINE HCL 50 MG/ML IJ SOLN: 12.5000 mg | INTRAMUSCULAR | Status: DC | PRN

## 2019-06-16 MED ORDER — HALOPERIDOL LACTATE 5 MG/ML IJ SOLN
1.0000 mg | INTRAMUSCULAR | Status: DC | PRN
  Administered 2019-06-16: 1 mg via INTRAVENOUS
  Filled 2019-06-16: qty 1

## 2019-06-16 MED ORDER — GLYCOPYRROLATE 0.2 MG/ML IJ SOLN: 0.3000 mg | INTRAMUSCULAR | Status: DC | PRN

## 2019-06-16 MED ORDER — QUETIAPINE FUMARATE 25 MG PO TABS: 25.0000 mg | ORAL_TABLET | Freq: Every day | ORAL | Status: DC

## 2019-06-16 MED ORDER — HALOPERIDOL LACTATE 5 MG/ML IJ SOLN: 1.0000 mg | Freq: Four times a day (QID) | INTRAMUSCULAR | Status: DC | PRN

## 2019-06-16 MED ORDER — ONDANSETRON HCL 4 MG/2ML IJ SOLN: 4.0000 mg | Freq: Four times a day (QID) | INTRAMUSCULAR | Status: DC | PRN

## 2019-06-16 MED ORDER — POLYVINYL ALCOHOL 1.4 % OP SOLN
1.0000 [drp] | Freq: Four times a day (QID) | OPHTHALMIC | Status: DC | PRN
  Filled 2019-06-16: qty 15

## 2019-06-16 NOTE — Consult Note (Signed)
Consultation Note Date: 06/16/2019   Patient Name: [name]  DOB: [birth year]  MRN: [id number]  Age / Sex: 85 y.o., female   PCP: Redmond School, MD Referring Physician: Shelly Coss, MD   REASON FOR CONSULTATION:Establishing goals of care  Palliative Care consult requested for this 85 y.o. female with multiple medical problems including hypertension, chronic diastolic CHF, GERD, hyperlipidemia, dementia with behavioral disturbance, and AAA. She presented to the ED with lethargy and confusion. Son reported patient recently came home from the [facility] a week prior and was doing well. Chest x-ray showed stable cardiac silhouette with mild volume overload. Her ammonia was elevated to 87 and lactic acid 2.7. COVID-19 negative. Since admission patient receiving lactulose. She continues to have agitation and confusion.   Clinical Assessment and Goals of Care: I have reviewed medical records including lab results, imaging, Epic notes, and MAR, received report from the bedside RN, and assessed the patient. I met at the bedside with patient's daughter-in-law/POA Marcelene Butte) and Duane Boston (son/POA) was on speaker phone. We discussed diagnosis prognosis, GOC, EOL wishes, disposition and options. Patient is asleep but arousable. She is confused and attempts to throw legs over side rails and pull at cords. Right hand mitt in place.   I introduced Palliative Medicine as specialized medical care for people living with serious illness. It focuses on providing relief from the symptoms and stress of a serious illness. The goal is to improve quality of life for both the patient and the family.  We discussed a brief life review of the patient, along with her functional and nutritional status.  Son reports prior to admission patient returned home from the [facility] after being there for over a week for rehabilitation.  6 weeks prior patient lived at home alone with family frequently checking  in on her.  She is a retired Civil engineer, contracting.  She had 4 children however unfortunately 2 of her children passed away over the past year.  Son reports patient has been in and out of the hospital over the past couple of weeks due to dehydration and worsening mental status.  Family reports a noticeable and severe decline over the past month.  6 weeks prior patient was able to independently perform all ADLs.  She was ambulatory with a walker.  Son reports over the past week patient has been sleeping more, increased confusion (unfamiliar with family members), intermittent episodes of incontinence, and decreased p.o. intake.  He states family has been with patient 24/7 since she returned home.  We discussed Her current illness and what it means in the larger context of Her on-going co-morbidities. With specific discussions regarding advanced dementia, dysphagia, acute kidney injury, hyperammonemia, and her overall functional and nutritional state. Natural disease trajectory and expectations at EOL were discussed.  Son is tearful in conversation expressing his noticeable decline in his mother's health and wishes for her not to be in a suffering state.  He verbalizes understanding of her current illnesses and comorbidities.  I attempted to elicit values and goals of care important to the patient.    The difference between aggressive medical intervention and comfort care was considered in light of the patient's goals of care. I educated family on what comfort care measures would look like. I educated family that patient would no longer receive aggressive medical interventions such as continuous IVF, vital signs, lab work, radiology testing, or medications not focused on comfort. All care will focus on how the  patient is looking and feeling. This will include management of any symptoms that may cause discomfort, pain, shortness of breath, cough, nausea, agitation, anxiety, and/or secretions etc. Symptoms will be  managed with medications and other non-pharmacological interventions such as spiritual support if requested, repositioning, music therapy, or therapeutic listening. Son and daughter-in-law verbalized understanding and appreciation. They shared that son's wife is also hospitalized upstairs and has been transitioned to full comfort as well with anticipated hospital death. Emotional support provided.   Son states his mother is a woman of [place] and have always made her wishes known. He reports if she knew she was in this state she would not be happy, knowing she is no longer independent, doesn't know her family who mean the most to her, and having to come back and forth to the hospital. Support given. He is requesting to transition all care to full comfort. He request to make sure that she is not suffering and to be kept calm and comfortable during her dying process. Assured family staff does a great job assessing and managing symptoms.   Son confirms DNR/DNI. He is patient's documented HCPOA.   Hospice services outpatient were explained and offered. Family verbalized their understanding and awareness of hospice's goals and philosophy of care. I discussed options of patient returning home with family versus residential hospice facility. Family is requesting residential hospice near their home. Son states he is familiar with the [facility] and would like to be considered for their location. I educated family on process of referral and transport once approved and bed is available.   Questions and concerns were addressed. The family was encouraged to call with questions or concerns.  PMT will continue to support holistically.   SOCIAL HISTORY:     reports that she quit smoking about 27 years ago. Her smoking use included cigarettes. She smoked 0.50 packs per day. She has never used smokeless tobacco. She reports that she does not drink alcohol or use drugs.  CODE STATUS:  DNR  ADVANCE DIRECTIVES: Sarajane Marek    SYMPTOM MANAGEMENT:  Morphine PRN for pain/shortness of breath  Haldol PRN for anxiety/agitation  Robinul PRN for excessive secretions  Zofran PRN for nausea   Palliative Prophylaxis:   Aspiration, Delirium Protocol, Eye Care, Frequent Pain Assessment and Oral Care  PSYCHO-SOCIAL/SPIRITUAL:  Support System: Family   Desire for further Chaplaincy support: Yes   Additional Recommendations (Limitations, Scope, Preferences):  Full Comfort Care   PAST MEDICAL HISTORY: Past Medical History:  Diagnosis Date   GERD (gastroesophageal reflux disease)    Hyperlipidemia    Hypertension    Neuromuscular disorder ([place])     PAST SURGICAL HISTORY:  Past Surgical History:  Procedure Laterality Date   ABDOMINAL AORTIC ANEURYSM REPAIR     BREAST BIOPSY     CHOLECYSTECTOMY     INGUINAL HERNIA REPAIR     TOTAL HIP ARTHROPLASTY      ALLERGIES:  is allergic to sulfa antibiotics; angiotensin receptor blockers; lisinopril; and cephalexin.   MEDICATIONS:  Current Facility-Administered Medications  Medication Dose Route Frequency Provider Last Rate Last Dose   0.9 %  sodium chloride infusion  250 mL Intravenous PRN Opyd, Ilene Qua, MD       haloperidol lactate (HALDOL) injection 1 mg  1 mg Intravenous Q6H PRN Adhikari, Amrit, MD       lactulose (CHRONULAC) 10 GM/15ML solution 10 g  10 g Oral TID Shelly Coss, MD  methylphenidate (RITALIN) tablet 2.5 mg  2.5 mg Oral Daily Adhikari, Amrit, MD   2.5 mg at 06/16/19 1302   ondansetron (ZOFRAN) tablet 4 mg  4 mg Oral Q6H PRN Opyd, Ilene Qua, MD       Or   ondansetron (ZOFRAN) injection 4 mg  4 mg Intravenous Q6H PRN Opyd, Ilene Qua, MD       risperiDONE (RISPERDAL) tablet 0.25 mg  0.25 mg Oral QHS Adhikari, Amrit, MD       sodium chloride flush (NS) 0.9 % injection 3 mL  3 mL Intravenous Once Opyd, Ilene Qua, MD       sodium chloride flush (NS) 0.9 % injection 3 mL  3  mL Intravenous Q12H Opyd, Ilene Qua, MD   3 mL at 06/15/19 0309   sodium chloride flush (NS) 0.9 % injection 3 mL  3 mL Intravenous Q12H Opyd, Ilene Qua, MD   3 mL at 06/15/19 2225   sodium chloride flush (NS) 0.9 % injection 3 mL  3 mL Intravenous PRN Opyd, Ilene Qua, MD        VITAL SIGNS: BP (!) 127/59 (BP Location: Right Arm)    Pulse 94    Temp 98.1 F (36.7 C) (Oral)    Resp 16    Wt 61.2 kg    SpO2 98%    BMI 19.94 kg/m  Filed Weights   06/15/19 0500 06/16/19 0600  Weight: 71.8 kg 61.2 kg    Estimated body mass index is 19.94 kg/m as calculated from the following:   Height as of 06/06/19: '5\' 9"'  (1.753 m).   Weight as of this encounter: 61.2 kg.  LABS: CBC:    Component Value Date/Time   WBC 5.5 06/15/2019 0159   HGB 9.2 (L) 06/15/2019 0159   HCT 28.5 (L) 06/15/2019 0159   PLT 159 06/15/2019 0159   Comprehensive Metabolic Panel:    Component Value Date/Time   NA 142 06/16/2019 0444   K 3.5 06/16/2019 0444   CO2 21 (L) 06/16/2019 0444   BUN 20 06/16/2019 0444   CREATININE 0.80 06/16/2019 0444   ALBUMIN 2.0 (L) 06/15/2019 0159     Review of Systems  Unable to perform ROS: Dementia   Physical Exam General: NAD, chronically-ill appearing, agitated Cardiovascular: regular rate and rhythm, trace pedal edema  Pulmonary: clear ant fields, diminished bases Abdomen: soft, nontender, + bowel sounds Extremities: trace pedal edema, no joint deformities Skin: no rashes, scattered bruising, warm & dry Neurological: alert, confused, agitated   Prognosis: < 2 weeks in the setting of poor p.o. intake, generalized weakness, metabolic encephalopathy, advanced dementia, elevated ammonia, dysphagia, acute kidney injury, acute on chronic diastolic CHF, hypertension, deconditioning, hypokalemia.  Discharge Planning:  Hospice facility  Recommendations:  DNR/DNI-as confirmed by son/POA  Full comfort care  Family request consideration for residential hospice at [facility] (they are from [place]). (CSW referral placed)  Family able to visit at the bedside for decision-making/EOL support.  Will D/C all orders not comfort focused.  Morphine as needed for pain/shortness of breath  Haldol PRN for agitation/anxiety  Zofran PRN for nausea  Liquifilm Tears as needed for dry eyes  Robinul PRN for excessive secretions  PMT will continue to support and follow.   Palliative Performance Scale: PPS 10%              Family expressed understanding and was in agreement with this plan.   Thank you for allowing the Palliative Medicine Team to  assist in the care of this patient.  Time In: 1315 Time Out: 1430 Time Total: 75 min.   Visit consisted of counseling and education dealing with the complex and emotionally intense issues of symptom management and palliative care in the setting of serious and potentially life-threatening illness.Greater than 50%  of this time was spent counseling and coordinating care related to the above assessment and plan.  Signed by:  Alda Lea, AGPCNP-BC Palliative Medicine Team  Phone: [phone] Fax: [phone] Pager: [phone] Amion: Bjorn Pippin

## 2019-06-16 NOTE — Progress Notes (Addendum)
PROGRESS NOTE    [name]  VEH:[id number] DOB: 07/13/1927 DOA: 06/14/2019 PCP: Elfredia Nevins, MD   Brief Narrative: Patient is a 85 year old female with history of hypertension, chronic diastolic CHF, dementia, aortic aneurysm who presents to the emergency department with complaints of lethargy, confusion.  She just returned from a skilled nursing facility to home.  She was admitted to the hospital last month with similar circumstances, was found to have elevated ammonia level and acute kidney injury. Ammonia level found to be elevated at 87, lactic acid was found to be 2.7. Patient was started on lactulose.  Ammonia level this morning is normal.  Patient is a still agitated, altered.  Given her advanced age, advanced dementia I have recommended hospice after discussion with the son who agrees and willing to know more about hospice idea.  I have consulted palliative care.  Patient is DNR.  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  Ammonia level normal today.  Patient is still confused, agitated this morning most likely secondary to dementia/hospital delirium. Continue Risperdal at bedtime.  Also on ritalin.Continue Haldol for severe agitation.  Elevated ammonia level: Unknown etiology.  Right upper quadrant ultrasound did not show any definitive liver abnormalities.  Patient is not on medicines that increase ammonia level .  Continue low-dose lactulose  Advanced dementia: Discussed with son today on phone.  He is receptive about hospice.  I have requested palliative care  evaluation.  She is a candidate for hospice: Residential or home.  Patient is DNR.  Continue supportive care.  Dysphagia: Patient is evaluated by speech therapy and recommended dysphagia 1 diet.  Acute kidney injury: Currently kidney function at baseline.  Acute on chronic diastolic CHF: .  Chest x-ray suggested hypervolemia.  She had lower extremity edema.  She was given Lasix 40 mg IV once.  Currently euvolemic.  On as needed Lasix at home.  Hypertension: Currently blood pressure stable  Abdominal aortic aneurysm: 4.1 cm abdominal aortic enlargement on recent CT.  Continue outpatient follow-up if discharged to home.  Hypokalemia: Supplemented and corrected.  Deconditioning/debility: PT/OT evaluation requested        DVT prophylaxis: Heparin  Code Status: DNR Family Communication: Talked to son on phone Disposition Plan: Undetermined at this point.  Palliative care consultation for possible hospice.  If not then most likely skilled nursing facility, PT/OT evaluation pending  Consultants: None  Procedures: None  Antimicrobials:  Anti-infectives (From admission, onward)   None      Subjective:  Patient seen and examined the bedside this morning.  Hemodynamically stable.    Agitated and confused this morning.  Repeats same word again and again.  Objective: Vitals:   06/16/19 0540 06/16/19 0600 06/16/19 0809 06/16/19 1147  BP: (!) 109/55  (!) 144/64 (!) 127/59  Pulse: (!) 110  (!) 103 94  Resp:   15 16  Temp: 97.9 F (36.6 C)  97.6 F (36.4 C) 98.1 F (36.7 C)  TempSrc: Oral  Oral Oral  SpO2: 98%  98% 98%  Weight:  61.2 kg      Intake/Output Summary (Last 24 hours) at 06/16/2019  1147 Last data filed at 06/16/2019 0645 Gross per 24 hour  Intake 205.26 ml  Output 320 ml  Net -114.74 ml   Filed Weights   06/15/19 0500 06/16/19 0600  Weight: 71.8 kg 61.2 kg    Examination:  General exam: Extremely debilitated, elderly female  HEENT:PERRL,Oral mucosa  moist, Ear/Nose normal on gross exam Respiratory system: Bilateral equal air entry, normal vesicular breath sounds, no wheezes or crackles  Cardiovascular system: S1 & S2 heard, RRR. No JVD, murmurs, rubs, gallops or clicks. Trace pedal edema. Gastrointestinal system: Abdomen is nondistended, soft and nontender. No organomegaly or masses felt. Normal bowel sounds heard. Central nervous system: Alert but not  Oriented.Agitated Extremities: Trace edema, no clubbing ,no cyanosis Skin: No rashes, lesions or ulcers,no icterus ,no pallor Psychiatry: Judgement and insight appear impaired  Data Reviewed: I have personally reviewed following labs and imaging studies  CBC: Recent Labs  Lab 06/14/19 1731 06/15/19 0159  WBC 6.0 5.5  NEUTROABS  --  2.8  HGB 11.1* 9.2*  HCT 34.1* 28.5*  MCV 98.0 95.0  PLT 215 159   Basic Metabolic Panel: Recent Labs  Lab 06/14/19 1731 06/15/19 0159 06/16/19 0444  NA 140 142 142  K 3.0* 2.9* 3.5  CL 108 110 110  CO2 23 24 21*  GLUCOSE 102* 101* 98  BUN 16 19 20   CREATININE 1.10* 0.97 0.80  CALCIUM 8.7* 8.1* 8.6*  MG  --  2.0  --    GFR: Estimated Creatinine Clearance: 45.2 mL/min (by C-G formula based on SCr of 0.8 mg/dL). Liver Function Tests: Recent Labs  Lab 06/14/19 1731 06/15/19 0159  AST 27 21  ALT 17 14  ALKPHOS 121 89  BILITOT 2.1* 2.0*  1.8*  PROT 6.4* 4.9*  ALBUMIN 2.5* 2.0*   No results for input(s): LIPASE, AMYLASE in the last 168 hours. Recent Labs  Lab 06/14/19 1731 06/15/19 0159 06/16/19 0444  AMMONIA 87* 89* 32   Coagulation Profile: Recent Labs  Lab 06/15/19 1113  INR 1.3*   Cardiac Enzymes: No results for input(s): CKTOTAL, CKMB, CKMBINDEX, TROPONINI in the last 168 hours. BNP (last 3 results) No results for input(s): PROBNP in the last 8760 hours. HbA1C: No results for input(s): HGBA1C in the last 72 hours. CBG: No results for input(s): GLUCAP in the last 168 hours. Lipid Profile: No results for  input(s): CHOL, HDL, LDLCALC, TRIG, CHOLHDL, LDLDIRECT in the last 72 hours. Thyroid Function Tests: No results for input(s): TSH, T4TOTAL, FREET4, T3FREE, THYROIDAB in the last 72 hours. Anemia Panel: No results for input(s): VITAMINB12, FOLATE, FERRITIN, TIBC, IRON, RETICCTPCT in the last 72 hours. Sepsis Labs: Recent Labs  Lab 06/14/19 2200 06/15/19 0159  LATICACIDVEN 2.7* 1.7    Recent Results (from the past 240 hour(s))  Blood culture (routine x 2)     Status: None (Preliminary result)   Collection Time: 06/14/19 10:00 PM   Specimen: BLOOD  Result Value Ref Range Status   Specimen Description BLOOD LEFT ARM  Final   Special Requests   Final    BOTTLES DRAWN AEROBIC AND ANAEROBIC Blood Culture adequate volume   Culture   Final    NO GROWTH 2 DAYS Performed at [facility], [address] [place], [place] [address]    Report Status PENDING  Incomplete  Blood culture (routine x 2)     Status: None (Preliminary result)   Collection Time: 06/14/19 10:12 PM   Specimen: BLOOD  Result Value Ref Range Status  Specimen Description BLOOD RIGHT HAND  Final   Special Requests   Final    BOTTLES DRAWN AEROBIC ONLY Blood Culture results may not be optimal due to an inadequate volume of blood received in culture bottles   Culture   Final    NO GROWTH 2 DAYS Performed at [facility], [place] [address] [place], [place] [address]    Report Status PENDING  Incomplete  SARS Coronavirus 2 (CEPHEID - Performed in [place] hospital lab), Hosp Order     Status: None   Collection Time: 06/14/19 11:32 PM   Specimen: Nasopharyngeal Swab  Result Value Ref Range Status   SARS Coronavirus 2 NEGATIVE NEGATIVE Final    Comment: (NOTE) If result is NEGATIVE SARS-CoV-2 target nucleic acids are NOT DETECTED. The SARS-CoV-2 RNA is generally detectable in upper and lower  respiratory specimens during the acute phase of infection. The lowest  concentration of SARS-CoV-2 viral copies  this assay can detect is 250  copies / mL. A negative result does not preclude SARS-CoV-2 infection  and should not be used as the sole basis for treatment or other  patient management decisions.  A negative result may occur with  improper specimen collection / handling, submission of specimen other  than nasopharyngeal swab, presence of viral mutation(s) within the  areas targeted by this assay, and inadequate number of viral copies  (<250 copies / mL). A negative result must be combined with clinical  observations, patient history, and epidemiological information. If result is POSITIVE SARS-CoV-2 target nucleic acids are DETECTED. The SARS-CoV-2 RNA is generally detectable in upper and lower  respiratory specimens dur ing the acute phase of infection.  Positive  results are indicative of active infection with SARS-CoV-2.  Clinical  correlation with patient history and other diagnostic information is  necessary to determine patient infection status.  Positive results do  not rule out bacterial infection or co-infection with other viruses. If result is PRESUMPTIVE POSTIVE SARS-CoV-2 nucleic acids MAY BE PRESENT.   A presumptive positive result was obtained on the submitted specimen  and confirmed on repeat testing.  While 2019 novel coronavirus  (SARS-CoV-2) nucleic acids may be present in the submitted sample  additional confirmatory testing may be necessary for epidemiological  and / or clinical management purposes  to differentiate between  SARS-CoV-2 and other Sarbecovirus currently known to infect humans.  If clinically indicated additional testing with an alternate test  methodology [phone]) is advised. The SARS-CoV-2 RNA is generally  detectable in upper and lower respiratory sp ecimens during the acute  phase of infection. The expected result is Negative. Fact Sheet for Patients:  StrictlyIdeas.no Fact Sheet for Healthcare Providers:  BankingDealers.co.za This test is not yet approved or cleared by the [place] FDA and has been authorized for detection and/or diagnosis of SARS-CoV-2 by FDA under an Emergency Use Authorization (EUA).  This EUA will remain in effect (meaning this test can be used) for the duration of the COVID-19 declaration under Section 564(b)(1) of the Act, 21 U.S.C. section 360bbb-3(b)(1), unless the authorization is terminated or revoked sooner. Performed at [facility], [place] [address], [place], [place] [address]          Radiology Studies: Dg Chest Portable 1 View  Result Date: 06/14/2019 CLINICAL DATA:  Altered mental status EXAM: PORTABLE CHEST 1 VIEW COMPARISON:  May 20, 2019 FINDINGS: The heart size is stable. Aortic calcifications are noted. The thoracic aorta is likely ectatic. There is no pneumothorax  or large pleural effusion. Atelectasis is noted at the lung bases. The lung apices are not well evaluated secondary to overlapping structures. There is mild generalized volume overload. IMPRESSION: Stable cardiac silhouette with mild volume overload. Electronically Signed   By: Katherine Mantlehristopher  Green M.D.   On: 06/14/2019 22:14   [place] Abdomen Limited Ruq  Result Date: 06/15/2019 CLINICAL DATA:  A creased ammonia level. EXAM: ULTRASOUND ABDOMEN LIMITED RIGHT UPPER QUADRANT COMPARISON:  CT scan of May 20, 2019. FINDINGS: Gallbladder: Status post cholecystectomy. Common bile duct: Diameter: 8 mm which is within normal limits for post cholecystectomy status. Liver: No focal lesion identified. Within normal limits in parenchymal echogenicity. Portal vein is patent on color Doppler imaging with normal direction of blood flow towards the liver. IMPRESSION: Status post cholecystectomy. No definite abnormality seen in the right upper quadrant of the abdomen. Electronically Signed   By: Lupita RaiderJames  Green Jr M.D.   On: 06/15/2019 11:57        Scheduled Meds: . heparin  5,000  Units Subcutaneous Q8H  . lactulose  10 g Oral TID  . risperiDONE  0.25 mg Oral QHS  . sodium chloride flush  3 mL Intravenous Once  . sodium chloride flush  3 mL Intravenous Q12H  . sodium chloride flush  3 mL Intravenous Q12H   Continuous Infusions: . sodium chloride       LOS: 0 days    Time spent: 35 mins.More than 50% of that time was spent in counseling and/or coordination of care.      Burnadette PopAmrit Aerielle Stoklosa, MD [facility] Pager [phone]  If 7PM-7AM, please contact night-coverage [email or web address] Password [email or web address] 06/16/2019, 11:47 AM

## 2019-06-16 NOTE — Progress Notes (Signed)
Patient's life alert necklace was found around patient's neck. This nurse took it off and placed it in patient belonging bag in closet. Nurse will continue to monitor. [place]

## 2019-06-16 NOTE — TOC Initial Note (Signed)
Transition of Care [facility]) - Initial/Assessment Note    Patient Details  Name: [name] MRN: [id number] Date of Birth: 18-Dec-1926  Transition of [facility]) CM/SW Contact:    Geralynn Ochs, LCSW Phone Number: 06/16/2019, 3:59 PM  Clinical Narrative:   CSW spoke with patient's son, Jori Moll, about discharge to hospice home. Jori Moll chose [facility]. CSW explained referral process, and indicated that someone from hospice home would be reaching out to him. CSW contacted [facility] to provide referral. No beds available today, but will alert CSW when bed available.                 Expected Discharge Plan: [place] Barriers to Discharge: Continued Medical Work up, Hospice Bed not available   Patient Goals and CMS Choice Patient states their goals for this hospitalization and ongoing recovery are:: patient's son hopeful that patient is comfortable, getting her closer to home for hospice care CMS Medicare.gov Compare Post Acute Care list provided to:: Patient Represenative (must comment) Choice offered to / list presented to : Adult Children  Expected Discharge Plan and Services Expected Discharge Plan: [place]     Post Acute Care Choice: Hospice Living arrangements for the past 2 months: Single Family Home                                      Prior Living Arrangements/Services Living arrangements for the past 2 months: Single Family Home Lives with:: Self Patient language and need for interpreter reviewed:: No        Need for Family Participation in Patient Care: Yes (Comment) Care giver support system in place?: No (comment)   Criminal Activity/Legal Involvement Pertinent to Current Situation/Hospitalization: No - Comment as needed  Activities of Daily Living      Permission Sought/Granted Permission sought to share information with : Facility Sport and exercise psychologist, Family Supports Permission granted  to share information with : Yes, Verbal Permission Granted  Share Information with NAME: Jori Moll  Permission granted to share info w AGENCY: [facility] granted to share info w Relationship: Son     Emotional Assessment   Attitude/Demeanor/Rapport: Unable to Assess Affect (typically observed): Unable to Assess        Admission diagnosis:  Transient alteration of awareness [R40.4] Hypokalemia [E87.6] Patient Active Problem List   Diagnosis Date Noted  . Hyperammonemia ([place]) 06/15/2019  . AKI (acute kidney injury) ([place]) 06/15/2019  . Hyperbilirubinemia 06/15/2019  . Chronic diastolic (congestive) heart failure ([place]) 05/28/2019  . Hypertensive heart disease with chronic diastolic congestive heart failure ([place]) 05/28/2019  . Dyslipidemia 05/28/2019  . Hypokalemia 05/28/2019  . Psychosis in elderly with behavioral disturbance ([place]) 05/28/2019  . Altered mental status   . Gastroesophageal reflux disease   . Oropharyngeal dysphagia   . Acute encephalopathy 05/20/2019  . Dementia with behavioral disturbance ([place]) 05/20/2019  . Hemorrhoids 09/27/2017  . Esophageal dysmotility 03/29/2017  . Community acquired pneumonia 03/22/2017  . Aortic heart murmur 07/16/2015  . Essential hypertension 07/16/2015  . Neuropathy 07/16/2015  . Abdominal aortic aneurysm ([place]) 06/17/2012   PCP:  Redmond School, MD Pharmacy:   [place], [place] - [address] [place] [address] Phone: [phone] Fax: [phone], [place], [place] [address]  Phone: [phone] Fax: [phone]     Social Determinants of Health (SDOH) Interventions    Readmission Risk Interventions No flowsheet data found.

## 2019-06-17 NOTE — Discharge Summary (Signed)
Physician Discharge Summary  Kellie NearingFrances I Young ZOX:[id number] DOB: 1927/08/08 DOA: 06/14/2019  PCP: Elfredia NevinsFusco, Lawrence, MD  Admit date: 06/14/2019 Discharge date: 06/17/2019  Admitted From: Home Disposition:  Home  Discharge Condition:Stable CODE STATUS:Comfort Care Diet recommendation:regular  Brief/Interim Summary: Patient is a 85 year old female with history of hypertension, chronic diastolic CHF, dementia, aortic aneurysm who presents to the emergency department with complaints of lethargy, confusion. She just returned from a skilled nursing facility to home. She was admitted to the hospital last month with similar circumstances, was found to have elevated ammonia level and acute kidney injury. Ammonia level found to be elevated at 87, lactic acid was found to be 2.7. Patient was started on lactulose.    Given her advanced age, advanced dementia we recommended hospice after discussion with the son who agreed.Patient can be discharged to residential hospice today.  Following problems were discussed during her hospitalization:  Acute metabolic encephalopathy: Ammonia level elevated on admission.  Ammonia level normalised with lactulose.    Elevated ammonia level: Unknown etiology.  Right upper quadrant ultrasound did not show any definitive liver abnormalities.  Patient is not on medicines that increase ammonia level .   Advanced dementia: Discussed with son.  He is receptive about hospice.    Patient is DNR.    Dysphagia: Patient is evaluated by speech therapy and recommended dysphagia 1 diet.  Acute kidney injury: Currently kidney function at baseline.  Acute on chronic diastolic CHF: .  Chest x-ray suggested hypervolemia.  She had lower extremity edema.  She was given Lasix 40 mg IV once.  Currently euvolemic.  On as needed Lasix at home.  Hypertension: Currently blood pressure stable  Abdominal aortic aneurysm: 4.1 cm abdominal aortic enlargement on recent CT.     Hypokalemia: Supplemented and corrected.  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)   Hyperammonemia (HCC)   AKI (acute kidney injury) (HCC)   Hyperbilirubinemia    Discharge Instructions   Allergies as of 06/17/2019      Reactions   Sulfa Antibiotics Anaphylaxis   Angiotensin Receptor Blockers    6/23-6/29/2020 clinical glossal edema and mild angioedema on lisinopril while hospitalized for encephalopathy   Lisinopril    6/23-6/29/2020 clinical glossal edema and mild angioedema   Cephalexin Itching, Rash      Medication List    STOP taking these medications   amLODipine 5 MG tablet Commonly known as: NORVASC   calcium carbonate 500 MG chewable tablet Commonly known as: TUMS - dosed in mg elemental calcium   ENSURE ENLIVE PO   furosemide 20 MG tablet Commonly known as: LASIX   gabapentin 100 MG capsule Commonly known as: NEURONTIN   hydrocortisone 2.5 % rectal cream Commonly known as: ANUSOL-HC   methylphenidate 5 MG tablet Commonly known as: RITALIN   NON FORMULARY   pantoprazole 40 MG tablet Commonly known as: PROTONIX   Potassium Chloride ER 20 MEQ Tbcr   risperiDONE 0.25 MG tablet Commonly known as: RISPERDAL       Allergies  Allergen Reactions  . Sulfa Antibiotics Anaphylaxis  . Angiotensin Receptor Blockers     6/23-6/29/2020 clinical glossal edema and mild angioedema on lisinopril while hospitalized for encephalopathy  . Lisinopril     6/23-6/29/2020 clinical glossal edema and mild angioedema  .  Cephalexin Itching and Rash    Consultations:  Palliative care   Procedures/Studies: Ct Head Wo Contrast  Result Date: 05/20/2019 CLINICAL DATA:  Altered mental status.   Multiple recent falls. EXAM: CT HEAD WITHOUT CONTRAST TECHNIQUE: Contiguous axial images were obtained from the base of the skull through the vertex without intravenous contrast. COMPARISON:  CT abdomen pelvis dated May 01, 2019. FINDINGS: Brain: No evidence of acute infarction, hemorrhage, hydrocephalus, extra-axial collection or mass lesion/mass effect. Stable mild-to-moderate atrophy. Vascular: Atherosclerotic vascular calcification of the carotid siphons. No hyperdense vessel. Skull: Normal. Negative for fracture or focal lesion. Sinuses/Orbits: No acute finding. Other: None. IMPRESSION: 1.  No acute intracranial abnormality. Electronically Signed   By: Obie Dredge M.D.   On: 05/20/2019 13:18   Mr Angio Head Wo Contrast  Result Date: 05/20/2019 CLINICAL DATA:  Altered level of consciousness.  Found on floor. EXAM: MRI HEAD WITHOUT CONTRAST MRA HEAD WITHOUT CONTRAST TECHNIQUE: Multiplanar, multiecho pulse sequences of the brain and surrounding structures were obtained without intravenous contrast. Angiographic images of the head were obtained using MRA technique without contrast. COMPARISON:  CT head 05/20/2019 FINDINGS: MRI HEAD FINDINGS Brain: Image quality degraded by significant motion. Negative for acute infarct. Negative for hemorrhage, mass, or fluid collection. Mild chronic microvascular ischemic type changes in the white matter. Vascular: Normal arterial flow voids with exception of the left vertebral artery which is not visualized. This may be hypoplastic. Skull and upper cervical spine: Negative Sinuses/Orbits: Paranasal sinuses clear.  Bilateral cataract surgery Other: None MRA HEAD FINDINGS Image quality degraded by motion. Right vertebral dominant supplying the basilar and widely patent. Left vertebral artery not visualized and may be hypoplastic. Posterior cerebral arteries patent bilaterally. Cerebellar arteries not well seen due to motion Internal carotid artery patent bilaterally without  stenosis. Anterior and middle cerebral arteries patent. There is artifact obscuring vessel detail. IMPRESSION: 1. Image quality degraded significantly by motion 2. Negative for acute infarct.  No subdural hematoma or mass-effect. 3. MRA degraded by motion. No large vessel occlusion. Left vertebral artery appears hypoplastic. Electronically Signed   By: Marlan Palau M.D.   On: 05/20/2019 16:26   Mr Brain Wo Contrast  Result Date: 05/20/2019 CLINICAL DATA:  Altered level of consciousness.  Found on floor. EXAM: MRI HEAD WITHOUT CONTRAST MRA HEAD WITHOUT CONTRAST TECHNIQUE: Multiplanar, multiecho pulse sequences of the brain and surrounding structures were obtained without intravenous contrast. Angiographic images of the head were obtained using MRA technique without contrast. COMPARISON:  CT head 05/20/2019 FINDINGS: MRI HEAD FINDINGS Brain: Image quality degraded by significant motion. Negative for acute infarct. Negative for hemorrhage, mass, or fluid collection. Mild chronic microvascular ischemic type changes in the white matter. Vascular: Normal arterial flow voids with exception of the left vertebral artery which is not visualized. This may be hypoplastic. Skull and upper cervical spine: Negative Sinuses/Orbits: Paranasal sinuses clear.  Bilateral cataract surgery Other: None MRA HEAD FINDINGS Image quality degraded by motion. Right vertebral dominant supplying the basilar and widely patent. Left vertebral artery not visualized and may be hypoplastic. Posterior cerebral arteries patent bilaterally. Cerebellar arteries not well seen due to motion Internal carotid artery patent bilaterally without stenosis. Anterior and middle cerebral arteries patent. There is artifact obscuring vessel detail. IMPRESSION: 1. Image quality degraded significantly by motion 2. Negative for acute infarct.  No subdural hematoma or mass-effect. 3. MRA degraded by motion. No large vessel occlusion. Left vertebral artery appears  hypoplastic. Electronically Signed   By: Marlan Palau M.D.  On: 05/20/2019 16:26   Ct Abdomen Pelvis W Contrast  Result Date: 05/20/2019 CLINICAL DATA:  Recurrent falls. The patient was found down 05/18/2019. EXAM: CT ABDOMEN AND PELVIS WITH CONTRAST TECHNIQUE: Multidetector CT imaging of the abdomen and pelvis was performed using the standard protocol following bolus administration of intravenous contrast. CONTRAST:  75 mL OMNIPAQUE IOHEXOL 300 MG/ML  SOLN COMPARISON:  None. FINDINGS: Lower chest: Mild basilar atelectasis on the left. Lung bases otherwise clear. No pleural or pericardial effusion. Cardiomegaly. Hepatobiliary: No focal liver abnormality is seen. Status post cholecystectomy. No biliary dilatation. The dome of the liver is just off the superior margin of the scan. Pancreas: Unremarkable. No pancreatic ductal dilatation or surrounding inflammatory changes. Spleen: Normal in size without focal abnormality. Adrenals/Urinary Tract: The adrenal glands appear normal. Stomach/Bowel: The patient has a few small renal cysts bilaterally. There is some scar in the lower pole of the right kidney. Ureters and urinary bladder are unremarkable. Diverticulosis is worst in the sigmoid colon. No diverticulitis. A small hiatal hernia is seen. Vascular/Lymphatic: Aortic atherosclerosis. Aneurysm of the abdominal aorta measures up to 4.1 cm. No hemorrhage. No enlarged abdominal or pelvic lymph nodes. Reproductive: Uterus and bilateral adnexa are unremarkable. Other: Small fat containing supraumbilical hernia is noted. Musculoskeletal: No acute or focal abnormality. Convex right scoliosis and multilevel spondylosis noted. The patient is status post left hip replacement. IMPRESSION: No acute abnormality abdomen or pelvis. 4.1 cm abdominal aortic aneurysm. Recommend followup by ultrasound in 1 year. This recommendation follows ACR consensus guidelines: White Paper of the ACR Incidental Findings Committee II on  Vascular Findings. J Am Coll Radiol 2013; 10:789-794. Aortic aneurysm NOS (ICD10-I71.9) Atherosclerosis. Diverticulosis without diverticulitis. Small fat containing supraumbilical hernia. Small hiatal hernia. Electronically Signed   By: Inge Rise M.D.   On: 05/20/2019 13:19   Dg Chest Portable 1 View  Result Date: 06/14/2019 CLINICAL DATA:  Altered mental status EXAM: PORTABLE CHEST 1 VIEW COMPARISON:  May 20, 2019 FINDINGS: The heart size is stable. Aortic calcifications are noted. The thoracic aorta is likely ectatic. There is no pneumothorax or large pleural effusion. Atelectasis is noted at the lung bases. The lung apices are not well evaluated secondary to overlapping structures. There is mild generalized volume overload. IMPRESSION: Stable cardiac silhouette with mild volume overload. Electronically Signed   By: Constance Holster M.D.   On: 06/14/2019 22:14   Dg Chest Port 1 View  Result Date: 05/20/2019 CLINICAL DATA:  Altered mental status, found on floor reside bed on Sunday morning, multiple falls, history hypertension, GERD EXAM: PORTABLE CHEST 1 VIEW COMPARISON:  Portable exam 1038 hours compared to 05/01/2019 FINDINGS: Rotated to the LEFT. Enlargement of cardiac silhouette. Mediastinal contours and pulmonary vascularity normal. Atherosclerotic calcification aorta. Bibasilar opacities favor atelectasis. Remaining lungs clear. No pleural effusion or pneumothorax. Bones demineralized. IMPRESSION: Enlargement of cardiac silhouette with probable bibasilar atelectasis. Electronically Signed   By: Lavonia Dana M.D.   On: 05/20/2019 11:04   US Abdomen Limited Ruq  Result Date: 06/15/2019 CLINICAL DATA:  A creased ammonia level. EXAM: ULTRASOUND ABDOMEN LIMITED RIGHT UPPER QUADRANT COMPARISON:  CT scan of May 20, 2019. FINDINGS: Gallbladder: Status post cholecystectomy. Common bile duct: Diameter: 8 mm which is within normal limits for post cholecystectomy status. Liver: No focal lesion  identified. Within normal limits in parenchymal echogenicity. Portal vein is patent on color Doppler imaging with normal direction of blood flow towards the liver. IMPRESSION: Status post cholecystectomy. No definite abnormality seen in the right upper  quadrant of the abdomen. Electronically Signed   By: Lupita RaiderJames  Green Jr M.D.   On: 06/15/2019 11:57       Subjective:  Patient seen and examined at the bed side.Was on morphine drip.Appeared comfortable.  Discharge Exam: Vitals:   06/16/19 1646 06/17/19 0241  BP: 132/61 137/65  Pulse: 98 (!) 102  Resp: 17 18  Temp: (!) 96.8 F (36 C) 98.4 F (36.9 C)  SpO2: 98% 99%   Vitals:   06/16/19 0809 06/16/19 1147 06/16/19 1646 06/17/19 0241  BP: (!) 144/64 (!) 127/59 132/61 137/65  Pulse: (!) 103 94 98 (!) 102  Resp: 15 16 17 18   Temp: 97.6 F (36.4 C) 98.1 F (36.7 C) (!) 96.8 F (36 C) 98.4 F (36.9 C)  TempSrc: Oral Oral Axillary Oral  SpO2: 98% 98% 98% 99%  Weight:        General:Looked comfortable, not in distress.  In deep sleep   The results of significant diagnostics from this hospitalization (including imaging, microbiology, ancillary and laboratory) are listed below for reference.     Microbiology: Recent Results (from the past 240 hour(s))  Blood culture (routine x 2)     Status: None (Preliminary result)   Collection Time: 06/14/19 10:00 PM   Specimen: BLOOD  Result Value Ref Range Status   Specimen Description BLOOD LEFT ARM  Final   Special Requests   Final    BOTTLES DRAWN AEROBIC AND ANAEROBIC Blood Culture adequate volume   Culture   Final    NO GROWTH 3 DAYS Performed at [facility], [address] [place], [place] [address]    Report Status PENDING  Incomplete  Blood culture (routine x 2)     Status: None (Preliminary result)   Collection Time: 06/14/19 10:12 PM   Specimen: BLOOD  Result Value Ref Range Status   Specimen Description BLOOD RIGHT HAND  Final   Special Requests   Final     BOTTLES DRAWN AEROBIC ONLY Blood Culture results may not be optimal due to an inadequate volume of blood received in culture bottles   Culture   Final    NO GROWTH 3 DAYS Performed at [facility], [address] [place], [place] [address]    Report Status PENDING  Incomplete  SARS Coronavirus 2 (CEPHEID - Performed in [facility] hospital lab), Hosp Order     Status: None   Collection Time: 06/14/19 11:32 PM   Specimen: Nasopharyngeal Swab  Result Value Ref Range Status   SARS Coronavirus 2 NEGATIVE NEGATIVE Final    Comment: (NOTE) If result is NEGATIVE SARS-CoV-2 target nucleic acids are NOT DETECTED. The SARS-CoV-2 RNA is generally detectable in upper and lower  respiratory specimens during the acute phase of infection. The lowest  concentration of SARS-CoV-2 viral copies this assay can detect is 250  copies / mL. A negative result does not preclude SARS-CoV-2 infection  and should not be used as the sole basis for treatment or other  patient management decisions.  A negative result may occur with  improper specimen collection / handling, submission of specimen other  than nasopharyngeal swab, presence of viral mutation(s) within the  areas targeted by this assay, and inadequate number of viral copies  (<250 copies / mL). A negative result must be combined with clinical  observations, patient history, and epidemiological information. If result is POSITIVE SARS-CoV-2 target nucleic acids are DETECTED. The SARS-CoV-2 RNA is generally detectable in upper and lower  respiratory  specimens dur ing the acute phase of infection.  Positive  results are indicative of active infection with SARS-CoV-2.  Clinical  correlation with patient history and other diagnostic information is  necessary to determine patient infection status.  Positive results do  not rule out bacterial infection or co-infection with other viruses. If result is PRESUMPTIVE POSTIVE SARS-CoV-2 nucleic acids MAY BE  PRESENT.   A presumptive positive result was obtained on the submitted specimen  and confirmed on repeat testing.  While 2019 novel coronavirus  (SARS-CoV-2) nucleic acids may be present in the submitted sample  additional confirmatory testing may be necessary for epidemiological  and / or clinical management purposes  to differentiate between  SARS-CoV-2 and other Sarbecovirus currently known to infect humans.  If clinically indicated additional testing with an alternate test  methodology [phone]) is advised. The SARS-CoV-2 RNA is generally  detectable in upper and lower respiratory sp ecimens during the acute  phase of infection. The expected result is Negative. Fact Sheet for Patients:  BoilerBrush.com.cy Fact Sheet for Healthcare Providers: https://pope.com/ This test is not yet approved or cleared by the [place] FDA and has been authorized for detection and/or diagnosis of SARS-CoV-2 by FDA under an Emergency Use Authorization (EUA).  This EUA will remain in effect (meaning this test can be used) for the duration of the COVID-19 declaration under Section 564(b)(1) of the Act, 21 U.S.C. section 360bbb-3(b)(1), unless the authorization is terminated or revoked sooner. Performed at [facility], [address], [place], [place] [address]      Labs: BNP (last 3 results) Recent Labs    05/20/19 1028  BNP 323.0*   Basic Metabolic Panel: Recent Labs  Lab 06/14/19 1731 06/15/19 0159 06/16/19 0444  NA 140 142 142  K 3.0* 2.9* 3.5  CL 108 110 110  CO2 23 24 21*  GLUCOSE 102* 101* 98  BUN 16 19 20   CREATININE 1.10* 0.97 0.80  CALCIUM 8.7* 8.1* 8.6*  MG  --  2.0  --    Liver Function Tests: Recent Labs  Lab 06/14/19 1731 06/15/19 0159  AST 27 21  ALT 17 14  ALKPHOS 121 89  BILITOT 2.1* 2.0*  1.8*  PROT 6.4* 4.9*  ALBUMIN 2.5* 2.0*   No results for input(s): LIPASE, AMYLASE in the last 168  hours. Recent Labs  Lab 06/14/19 1731 06/15/19 0159 06/16/19 0444  AMMONIA 87* 89* 32   CBC: Recent Labs  Lab 06/14/19 1731 06/15/19 0159  WBC 6.0 5.5  NEUTROABS  --  2.8  HGB 11.1* 9.2*  HCT 34.1* 28.5*  MCV 98.0 95.0  PLT 215 159   Cardiac Enzymes: No results for input(s): CKTOTAL, CKMB, CKMBINDEX, TROPONINI in the last 168 hours. BNP: Invalid input(s): POCBNP CBG: No results for input(s): GLUCAP in the last 168 hours. D-Dimer No results for input(s): DDIMER in the last 72 hours. Hgb A1c No results for input(s): HGBA1C in the last 72 hours. Lipid Profile No results for input(s): CHOL, HDL, LDLCALC, TRIG, CHOLHDL, LDLDIRECT in the last 72 hours. Thyroid function studies No results for input(s): TSH, T4TOTAL, T3FREE, THYROIDAB in the last 72 hours.  Invalid input(s): FREET3 Anemia work up No results for input(s): VITAMINB12, FOLATE, FERRITIN, TIBC, IRON, RETICCTPCT in the last 72 hours. Urinalysis    Component Value Date/Time   COLORURINE YELLOW 06/14/2019 2200   APPEARANCEUR HAZY (A) 06/14/2019 2200   LABSPEC 1.009 06/14/2019 2200   PHURINE 6.0 06/14/2019 2200   GLUCOSEU NEGATIVE 06/14/2019  2200   HGBUR NEGATIVE 06/14/2019 2200   BILIRUBINUR NEGATIVE 06/14/2019 2200   KETONESUR NEGATIVE 06/14/2019 2200   PROTEINUR NEGATIVE 06/14/2019 2200   UROBILINOGEN 2.0 (H) 07/19/2015 1045   NITRITE NEGATIVE 06/14/2019 2200   LEUKOCYTESUR TRACE (A) 06/14/2019 2200   Sepsis Labs Invalid input(s): PROCALCITONIN,  WBC,  LACTICIDVEN Microbiology Recent Results (from the past 240 hour(s))  Blood culture (routine x 2)     Status: None (Preliminary result)   Collection Time: 06/14/19 10:00 PM   Specimen: BLOOD  Result Value Ref Range Status   Specimen Description BLOOD LEFT ARM  Final   Special Requests   Final    BOTTLES DRAWN AEROBIC AND ANAEROBIC Blood Culture adequate volume   Culture   Final    NO GROWTH 3 DAYS Performed at [facility], [address] [place], [place] [address]    Report Status PENDING  Incomplete  Blood culture (routine x 2)     Status: None (Preliminary result)   Collection Time: 06/14/19 10:12 PM   Specimen: BLOOD  Result Value Ref Range Status   Specimen Description BLOOD RIGHT HAND  Final   Special Requests   Final    BOTTLES DRAWN AEROBIC ONLY Blood Culture results may not be optimal due to an inadequate volume of blood received in culture bottles   Culture   Final    NO GROWTH 3 DAYS Performed at [facility], [address] [place], [place] [address]    Report Status PENDING  Incomplete  SARS Coronavirus 2 (CEPHEID - Performed in [facility] hospital lab), Hosp Order     Status: None   Collection Time: 06/14/19 11:32 PM   Specimen: Nasopharyngeal Swab  Result Value Ref Range Status   SARS Coronavirus 2 NEGATIVE NEGATIVE Final    Comment: (NOTE) If result is NEGATIVE SARS-CoV-2 target nucleic acids are NOT DETECTED. The SARS-CoV-2 RNA is generally detectable in upper and lower  respiratory specimens during the acute phase of infection. The lowest  concentration of SARS-CoV-2 viral copies this assay can detect is 250  copies / mL. A negative result does not preclude SARS-CoV-2 infection  and should not be used as the sole basis for treatment or other  patient management decisions.  A negative result may occur with  improper specimen collection / handling, submission of specimen other  than nasopharyngeal swab, presence of viral mutation(s) within the  areas targeted by this assay, and inadequate number of viral copies  (<250 copies / mL). A negative result must be combined with clinical  observations, patient history, and epidemiological information. If result is POSITIVE SARS-CoV-2 target nucleic acids are DETECTED. The SARS-CoV-2 RNA is generally detectable in upper and lower  respiratory specimens dur ing the acute phase of infection.  Positive  results are indicative of active infection with  SARS-CoV-2.  Clinical  correlation with patient history and other diagnostic information is  necessary to determine patient infection status.  Positive results do  not rule out bacterial infection or co-infection with other viruses. If result is PRESUMPTIVE POSTIVE SARS-CoV-2 nucleic acids MAY BE PRESENT.   A presumptive positive result was obtained on the submitted specimen  and confirmed on repeat testing.  While 2019 novel coronavirus  (SARS-CoV-2) nucleic acids may be present in the submitted sample  additional confirmatory testing may be necessary for epidemiological  and / or clinical management purposes  to differentiate between  SARS-CoV-2 and other Sarbecovirus currently known to infect humans.  If clinically indicated additional testing with an alternate test  methodology [phone]) is advised. The SARS-CoV-2 RNA is generally  detectable in upper and lower respiratory sp ecimens during the acute  phase of infection. The expected result is Negative. Fact Sheet for Patients:  BoilerBrush.com.cy Fact Sheet for Healthcare Providers: https://pope.com/ This test is not yet approved or cleared by the [place] FDA and has been authorized for detection and/or diagnosis of SARS-CoV-2 by FDA under an Emergency Use Authorization (EUA).  This EUA will remain in effect (meaning this test can be used) for the duration of the COVID-19 declaration under Section 564(b)(1) of the Act, 21 U.S.C. section 360bbb-3(b)(1), unless the authorization is terminated or revoked sooner. Performed at [facility], [address] [place], [place] [address]     Please note: You were cared for by a hospitalist during your hospital stay. Once you are discharged, your primary care physician will handle any further medical issues. Please note that NO REFILLS for any discharge medications will be authorized once you are discharged, as it is imperative  that you return to your primary care physician (or establish a relationship with a primary care physician if you do not have one) for your post hospital discharge needs so that they can reassess your need for medications and monitor your lab values.    Time coordinating discharge: 40 minutes  SIGNED:   Burnadette Pop, MD  [facility] 06/17/2019, 10:20 AM Pager [phone]  If 7PM-7AM, please contact night-coverage [email or web address] Password [email or web address]

## 2019-06-17 NOTE — Progress Notes (Signed)
Gave report to hospice nurse and pt discharged via [facility]

## 2019-06-17 NOTE — TOC Transition Note (Signed)
Transition of Care [facility]) - CM/SW Discharge Note   Patient Details  Name: [name] MRN: [id number] Date of Birth: [birth year]  Transition of Care [facility]) CM/SW Contact:  Geralynn Ochs, LCSW Phone Number: 06/17/2019, 11:17 AM   Clinical Narrative:  Nurse to call report to [phone]     Final next level of care: [place] Barriers to Discharge: Barriers Resolved   Patient Goals and CMS Choice Patient states their goals for this hospitalization and ongoing recovery are:: patient's son hopeful that patient is comfortable, getting her closer to home for hospice care CMS Medicare.gov Compare Post Acute Care list provided to:: Patient Represenative (must comment) Choice offered to / list presented to : Adult Children  Discharge Placement                Patient to be transferred to facility by: [place] Name of family member notified: Son Patient and family notified of of transfer: 06/17/19  Discharge Plan and Services     Post Acute Care Choice: Hospice                               Social Determinants of Health (SDOH) Interventions     Readmission Risk Interventions No flowsheet data found.

## 2019-06-17 NOTE — Progress Notes (Signed)
This chaplain followed up on PMT referral for Pt. and family spiritual care.  The chaplain talked to the Pt. RN-Ala before the visit.  Family members were not present at the time of the visit.  The chaplain observed the Pt.'s movement in her bed. The chaplain was pastorally present after calling the Pt. name and the Pt. did not respond. The chaplain is available for F/U spiritual care as needed.

## 2019-06-17 NOTE — Progress Notes (Signed)
  Speech Language Pathology Treatment: Dysphagia  Patient Details Name: [name] MRN: [id number] DOB: July 09, 1927 Today's Date: 06/17/2019 Time: 1005-1020 SLP Time Calculation (min) (ACUTE ONLY): 15 min  Assessment / Plan / Recommendation Clinical Impression  Patient seen to address dysphagia goals with trials of regular solids, thin liquids (current diet is Dys 1 puree, thin liquids)  She exhibited prolonged mastication and bolus formation of regular texture solids, with trace-min residuals in mouth post initial swallow, but with eventual full clearance of oral cavity and patient performing lingual sweep without cues. No overt s/s of aspiration or penetration with solids or thin liquids. As initial diet consistency was primarily secondary to patient's decreased level of alertness at the time, SLP is now recommending upgrade to Dys 3, mechanical soft, continue with thin liquids, continue with full supervision and assist as needed. Patient's son came into room towards end of session and informed SLP that the hospice facility called him and they have a bed available for patient today.     HPI HPI: 85 y.o. female with medical history significant for hypertension, chronic diastolic CHF, dementia with behavioral disturbance, and AAA, now presenting to the emergency department for evaluation of lethargy and confusion.  Patient is unable to contribute to the history due to her clinical condition.  Her son reports that she had been doing fairly well when she initially returned home from an SNF a week or so ago, but has now become lethargic, confused, and no longer able to carry on a conversation.       SLP Plan  Discharge SLP treatment due to (comment)(patient discharging to hospice facility today (06/17/19))       Recommendations  Diet recommendations: Dysphagia 3 (mechanical soft);Thin liquid Liquids provided via: Cup;Straw Medication Administration: Whole meds with puree Supervision: Staff to  assist with self feeding;Full supervision/cueing for compensatory strategies Compensations: Slow rate;Small sips/bites;Minimize environmental distractions;Lingual sweep for clearance of pocketing Postural Changes and/or Swallow Maneuvers: Seated upright 90 degrees                Oral Care Recommendations: Oral care BID;Staff/trained caregiver to provide oral care Follow up Recommendations: 24 hour supervision/assistance;Skilled Nursing facility SLP Visit Diagnosis: Dysphagia, oropharyngeal phase (R13.12) Plan: Discharge SLP treatment due to (comment)(patient discharging to hospice facility today (06/17/19))       GO                Dannial Monarch 06/17/2019, 11:18 AM   Sonia Baller, MA, CCC-SLP Speech Therapy [facility] Acute [facility]hab Pager: [phone]

## 2019-06-19 LAB — CULTURE, BLOOD (ROUTINE X 2)
Culture: NO GROWTH
Culture: NO GROWTH
Special Requests: ADEQUATE

## 2019-06-28 DEATH — deceased
